# Patient Record
Sex: Female | Born: 1940 | Race: White | Hispanic: No | Smoking: Current some day smoker
Health system: Southern US, Community
[De-identification: ages and names within clinical notes are randomized; demographics above are authoritative.]

## PROBLEM LIST (undated history)

## (undated) DIAGNOSIS — E871 Hypo-osmolality and hyponatremia: Secondary | ICD-10-CM

## (undated) DIAGNOSIS — IMO0002 Reserved for concepts with insufficient information to code with codable children: Secondary | ICD-10-CM

## (undated) DIAGNOSIS — J449 Chronic obstructive pulmonary disease, unspecified: Secondary | ICD-10-CM

## (undated) DIAGNOSIS — I10 Essential (primary) hypertension: Secondary | ICD-10-CM

## (undated) DIAGNOSIS — R296 Repeated falls: Secondary | ICD-10-CM

## (undated) DIAGNOSIS — B9620 Unspecified Escherichia coli [E. coli] as the cause of diseases classified elsewhere: Secondary | ICD-10-CM

## (undated) DIAGNOSIS — M25552 Pain in left hip: Secondary | ICD-10-CM

## (undated) DIAGNOSIS — R4189 Other symptoms and signs involving cognitive functions and awareness: Secondary | ICD-10-CM

## (undated) DIAGNOSIS — N390 Urinary tract infection, site not specified: Secondary | ICD-10-CM

## (undated) DIAGNOSIS — E440 Moderate protein-calorie malnutrition: Secondary | ICD-10-CM

## (undated) DIAGNOSIS — M549 Dorsalgia, unspecified: Secondary | ICD-10-CM

## (undated) DIAGNOSIS — R531 Weakness: Secondary | ICD-10-CM

## (undated) DIAGNOSIS — F419 Anxiety disorder, unspecified: Secondary | ICD-10-CM

## (undated) DIAGNOSIS — Z720 Tobacco use: Secondary | ICD-10-CM

## (undated) DIAGNOSIS — F1010 Alcohol abuse, uncomplicated: Secondary | ICD-10-CM

## (undated) DIAGNOSIS — E785 Hyperlipidemia, unspecified: Secondary | ICD-10-CM

## (undated) HISTORY — DX: Urinary tract infection, site not specified: N39.0

## (undated) HISTORY — DX: Moderate protein-calorie malnutrition: E44.0

## (undated) HISTORY — DX: Tobacco use: Z72.0

## (undated) HISTORY — DX: Hypercalcemia: E83.52

## (undated) HISTORY — DX: Weakness: R53.1

## (undated) HISTORY — PX: DENTAL SURGERY: SHX609

## (undated) HISTORY — DX: Other symptoms and signs involving cognitive functions and awareness: R41.89

## (undated) HISTORY — DX: Repeated falls: R29.6

## (undated) HISTORY — DX: Urinary tract infection, site not specified: B96.20

## (undated) HISTORY — DX: Pain in left hip: M25.552

## (undated) HISTORY — PX: BACK SURGERY: SHX140

## (undated) HISTORY — DX: Alcohol abuse, uncomplicated: F10.10

## (undated) HISTORY — DX: Hypo-osmolality and hyponatremia: E87.1

---

## 1999-09-06 ENCOUNTER — Encounter: Payer: Self-pay | Admitting: General Surgery

## 1999-09-06 ENCOUNTER — Encounter: Admission: RE | Admit: 1999-09-06 | Discharge: 1999-09-06 | Payer: Self-pay

## 1999-09-06 ENCOUNTER — Encounter (INDEPENDENT_AMBULATORY_CARE_PROVIDER_SITE_OTHER): Payer: Self-pay | Admitting: *Deleted

## 1999-09-06 ENCOUNTER — Other Ambulatory Visit: Admission: RE | Admit: 1999-09-06 | Discharge: 1999-09-06 | Payer: Self-pay | Admitting: General Surgery

## 2000-03-30 ENCOUNTER — Encounter: Admission: RE | Admit: 2000-03-30 | Discharge: 2000-03-30 | Payer: Self-pay | Admitting: Obstetrics and Gynecology

## 2000-03-30 ENCOUNTER — Encounter: Payer: Self-pay | Admitting: Obstetrics and Gynecology

## 2001-03-22 ENCOUNTER — Encounter: Payer: Self-pay | Admitting: General Surgery

## 2001-03-22 ENCOUNTER — Encounter (INDEPENDENT_AMBULATORY_CARE_PROVIDER_SITE_OTHER): Payer: Self-pay | Admitting: *Deleted

## 2001-03-22 ENCOUNTER — Ambulatory Visit (HOSPITAL_BASED_OUTPATIENT_CLINIC_OR_DEPARTMENT_OTHER): Admission: RE | Admit: 2001-03-22 | Discharge: 2001-03-22 | Payer: Self-pay | Admitting: General Surgery

## 2001-03-22 ENCOUNTER — Encounter: Admission: RE | Admit: 2001-03-22 | Discharge: 2001-03-22 | Payer: Self-pay | Admitting: General Surgery

## 2003-05-17 ENCOUNTER — Other Ambulatory Visit: Admission: RE | Admit: 2003-05-17 | Discharge: 2003-05-17 | Payer: Self-pay | Admitting: Family Medicine

## 2003-09-12 ENCOUNTER — Encounter: Admission: RE | Admit: 2003-09-12 | Discharge: 2003-11-02 | Payer: Self-pay | Admitting: Family Medicine

## 2005-01-27 ENCOUNTER — Encounter: Admission: RE | Admit: 2005-01-27 | Discharge: 2005-01-27 | Payer: Self-pay | Admitting: Internal Medicine

## 2005-01-29 ENCOUNTER — Encounter: Admission: RE | Admit: 2005-01-29 | Discharge: 2005-02-18 | Payer: Self-pay | Admitting: Internal Medicine

## 2005-05-16 ENCOUNTER — Ambulatory Visit: Payer: Self-pay | Admitting: Physical Medicine & Rehabilitation

## 2005-05-16 ENCOUNTER — Encounter
Admission: RE | Admit: 2005-05-16 | Discharge: 2005-08-14 | Payer: Self-pay | Admitting: Physical Medicine & Rehabilitation

## 2006-11-17 ENCOUNTER — Other Ambulatory Visit: Admission: RE | Admit: 2006-11-17 | Discharge: 2006-11-17 | Payer: Self-pay | Admitting: Family Medicine

## 2010-02-04 ENCOUNTER — Emergency Department (HOSPITAL_COMMUNITY): Admission: EM | Admit: 2010-02-04 | Discharge: 2010-02-04 | Payer: Self-pay | Admitting: Emergency Medicine

## 2010-02-09 ENCOUNTER — Ambulatory Visit (HOSPITAL_BASED_OUTPATIENT_CLINIC_OR_DEPARTMENT_OTHER): Admission: RE | Admit: 2010-02-09 | Discharge: 2010-02-09 | Payer: Self-pay | Admitting: Family Medicine

## 2010-02-09 ENCOUNTER — Ambulatory Visit: Payer: Self-pay | Admitting: Diagnostic Radiology

## 2010-04-03 ENCOUNTER — Encounter (HOSPITAL_BASED_OUTPATIENT_CLINIC_OR_DEPARTMENT_OTHER)
Admission: RE | Admit: 2010-04-03 | Discharge: 2010-04-23 | Payer: Self-pay | Source: Home / Self Care | Attending: General Surgery | Admitting: General Surgery

## 2010-04-24 ENCOUNTER — Encounter (HOSPITAL_BASED_OUTPATIENT_CLINIC_OR_DEPARTMENT_OTHER): Payer: Medicare Other | Attending: General Surgery

## 2010-04-24 DIAGNOSIS — I10 Essential (primary) hypertension: Secondary | ICD-10-CM | POA: Insufficient documentation

## 2010-04-24 DIAGNOSIS — J4489 Other specified chronic obstructive pulmonary disease: Secondary | ICD-10-CM | POA: Insufficient documentation

## 2010-04-24 DIAGNOSIS — L97309 Non-pressure chronic ulcer of unspecified ankle with unspecified severity: Secondary | ICD-10-CM | POA: Insufficient documentation

## 2010-04-24 DIAGNOSIS — J449 Chronic obstructive pulmonary disease, unspecified: Secondary | ICD-10-CM | POA: Insufficient documentation

## 2010-04-24 DIAGNOSIS — Z79899 Other long term (current) drug therapy: Secondary | ICD-10-CM | POA: Insufficient documentation

## 2010-05-29 ENCOUNTER — Encounter (HOSPITAL_BASED_OUTPATIENT_CLINIC_OR_DEPARTMENT_OTHER): Payer: Medicare Other | Attending: General Surgery

## 2010-05-29 DIAGNOSIS — L97309 Non-pressure chronic ulcer of unspecified ankle with unspecified severity: Secondary | ICD-10-CM | POA: Insufficient documentation

## 2010-05-29 DIAGNOSIS — I10 Essential (primary) hypertension: Secondary | ICD-10-CM | POA: Insufficient documentation

## 2010-05-29 DIAGNOSIS — J449 Chronic obstructive pulmonary disease, unspecified: Secondary | ICD-10-CM | POA: Insufficient documentation

## 2010-05-29 DIAGNOSIS — Z79899 Other long term (current) drug therapy: Secondary | ICD-10-CM | POA: Insufficient documentation

## 2010-05-29 DIAGNOSIS — J4489 Other specified chronic obstructive pulmonary disease: Secondary | ICD-10-CM | POA: Insufficient documentation

## 2010-06-04 LAB — CBC
HCT: 47.8 % — ABNORMAL HIGH (ref 36.0–46.0)
Hemoglobin: 16.5 g/dL — ABNORMAL HIGH (ref 12.0–15.0)
MCH: 32.9 pg (ref 26.0–34.0)
MCHC: 34.6 g/dL (ref 30.0–36.0)
RBC: 5.02 MIL/uL (ref 3.87–5.11)

## 2010-06-04 LAB — BASIC METABOLIC PANEL
BUN: 5 mg/dL — ABNORMAL LOW (ref 6–23)
CO2: 26 mEq/L (ref 19–32)
Calcium: 10 mg/dL (ref 8.4–10.5)
Chloride: 95 mEq/L — ABNORMAL LOW (ref 96–112)
Creatinine, Ser: 0.62 mg/dL (ref 0.4–1.2)
GFR calc Af Amer: >60 mL/min (ref >60)
GFR calc non Af Amer: >60 mL/min (ref >60)
Glucose, Bld: 88 mg/dL (ref 70–99)
Potassium: 4.4 mEq/L (ref 3.5–5.1)
Sodium: 131 mEq/L — ABNORMAL LOW (ref 135–145)

## 2010-06-04 LAB — DIFFERENTIAL
Basophils Relative: 1 % (ref 0–1)
Lymphs Abs: 1.7 10*3/uL (ref 0.7–4.0)
Monocytes Absolute: 0.6 10*3/uL (ref 0.1–1.0)
Monocytes Relative: 9 % (ref 3–12)
Neutro Abs: 4.1 10*3/uL (ref 1.7–7.7)
Neutrophils Relative %: 63 % (ref 43–77)

## 2010-06-04 LAB — HEPATIC FUNCTION PANEL
AST: 26 U/L (ref 0–37)
Albumin: 4.5 g/dL (ref 3.5–5.2)
Alkaline Phosphatase: 54 U/L (ref 39–117)
Bilirubin, Direct: 0.2 mg/dL (ref 0.0–0.3)
Total Bilirubin: 0.9 mg/dL (ref 0.3–1.2)

## 2010-06-25 ENCOUNTER — Encounter (HOSPITAL_BASED_OUTPATIENT_CLINIC_OR_DEPARTMENT_OTHER): Payer: Medicare Other | Attending: General Surgery

## 2010-06-25 DIAGNOSIS — J449 Chronic obstructive pulmonary disease, unspecified: Secondary | ICD-10-CM | POA: Insufficient documentation

## 2010-06-25 DIAGNOSIS — J4489 Other specified chronic obstructive pulmonary disease: Secondary | ICD-10-CM | POA: Insufficient documentation

## 2010-06-25 DIAGNOSIS — Z79899 Other long term (current) drug therapy: Secondary | ICD-10-CM | POA: Insufficient documentation

## 2010-06-25 DIAGNOSIS — L97309 Non-pressure chronic ulcer of unspecified ankle with unspecified severity: Secondary | ICD-10-CM | POA: Insufficient documentation

## 2010-06-25 DIAGNOSIS — I10 Essential (primary) hypertension: Secondary | ICD-10-CM | POA: Insufficient documentation

## 2010-07-03 ENCOUNTER — Other Ambulatory Visit: Payer: Self-pay | Admitting: Orthopedic Surgery

## 2010-07-03 ENCOUNTER — Other Ambulatory Visit (HOSPITAL_COMMUNITY): Payer: Self-pay | Admitting: Orthopedic Surgery

## 2010-07-03 DIAGNOSIS — S22059A Unspecified fracture of T5-T6 vertebra, initial encounter for closed fracture: Secondary | ICD-10-CM

## 2010-07-03 DIAGNOSIS — R05 Cough: Secondary | ICD-10-CM

## 2010-07-05 ENCOUNTER — Ambulatory Visit (HOSPITAL_COMMUNITY): Payer: Medicare Other

## 2010-07-05 ENCOUNTER — Encounter (HOSPITAL_COMMUNITY): Payer: Medicare Other

## 2010-07-15 ENCOUNTER — Ambulatory Visit
Admission: RE | Admit: 2010-07-15 | Discharge: 2010-07-15 | Disposition: A | Discharge status: Home / Self Care | Payer: Medicare Other | Source: Ambulatory Visit | Attending: Orthopedic Surgery | Admitting: Orthopedic Surgery

## 2010-07-15 DIAGNOSIS — R05 Cough: Secondary | ICD-10-CM

## 2010-08-09 ENCOUNTER — Encounter (HOSPITAL_COMMUNITY)
Admission: RE | Admit: 2010-08-09 | Discharge: 2010-08-09 | Disposition: A | Discharge status: Home / Self Care | Payer: Medicare Other | Source: Ambulatory Visit | Attending: Neurological Surgery | Admitting: Neurological Surgery

## 2010-08-09 ENCOUNTER — Other Ambulatory Visit (HOSPITAL_COMMUNITY): Payer: Self-pay | Admitting: Neurological Surgery

## 2010-08-09 ENCOUNTER — Ambulatory Visit (HOSPITAL_COMMUNITY)
Admission: RE | Admit: 2010-08-09 | Discharge: 2010-08-09 | Disposition: A | Discharge status: Home / Self Care | Payer: Medicare Other | Source: Ambulatory Visit | Attending: Neurological Surgery | Admitting: Neurological Surgery

## 2010-08-09 DIAGNOSIS — Z01812 Encounter for preprocedural laboratory examination: Secondary | ICD-10-CM | POA: Insufficient documentation

## 2010-08-09 DIAGNOSIS — M8448XA Pathological fracture, other site, initial encounter for fracture: Secondary | ICD-10-CM | POA: Insufficient documentation

## 2010-08-09 DIAGNOSIS — Z01811 Encounter for preprocedural respiratory examination: Secondary | ICD-10-CM | POA: Insufficient documentation

## 2010-08-09 DIAGNOSIS — M40204 Unspecified kyphosis, thoracic region: Secondary | ICD-10-CM

## 2010-08-09 LAB — BASIC METABOLIC PANEL
BUN: 11 mg/dL (ref 6–23)
Calcium: 10.6 mg/dL — ABNORMAL HIGH (ref 8.4–10.5)
Chloride: 90 mEq/L — ABNORMAL LOW (ref 96–112)
Creatinine, Ser: 0.63 mg/dL (ref 0.4–1.2)
GFR calc Af Amer: >60 mL/min (ref >60)
GFR calc non Af Amer: >60 mL/min (ref >60)

## 2010-08-09 LAB — CBC
MCH: 34 pg (ref 26.0–34.0)
MCHC: 35.9 g/dL (ref 30.0–36.0)
MCV: 94.8 fL (ref 78.0–100.0)
Platelets: 427 10*3/uL — ABNORMAL HIGH (ref 150–400)
RDW: 12.9 % (ref 11.5–15.5)

## 2010-08-09 LAB — SURGICAL PCR SCREEN
MRSA, PCR: POSITIVE — AB
Staphylococcus aureus: POSITIVE — AB

## 2010-08-09 NOTE — Op Note (Signed)
[place]  Patient:    [name], [name] Visit Number: [id number] MRN: [id number]          Service Type: DSU Location: [facility] Attending Physician:  Delsa Bern Dictated by:   Lorne Skeens. Hoxworth, M.D. Proc. Date: 03/22/01 Admit Date:  03/22/2001                             Operative Report  PREOPERATIVE DIAGNOSIS:  Abnormal mammogram, left breast.  POSTOPERATIVE DIAGNOSIS:  Abnormal mammogram, left breast.  PROCEDURE:  Needle-localized left breast biopsy.  SURGEON:  Lorne Skeens. Hoxworth, M.D.  ANESTHESIA:  Laryngeal mask general.  BRIEF HISTORY:  Ms. Gruhn is a 70 year old white female who has previously had a large-core needle biopsy of a nodular density in the upper outer quadrant of the left breast.  This did not reveal any suspicious findings. This has been followed mammographically and on recent mammogram has been noted to enlarge.  There is still no palpable abnormality.  Due to progression on the mammogram, excision has been recommended.  Needle localization will be required.  The nature of the procedure, its indications and risks of bleeding and infection were discussed and understood preoperatively.  Following successful needle localization, she is now brought to the operating room.  DESCRIPTION OF PROCEDURE:  The patient was brought to the operating room and placed in the supine position on the operating table.  IV sedation was administered but the patient was restless despite deep sedation, and therefore this was converted to laryngeal mask general without difficulty.  The left breast was sterilely prepped and draped.  A curvilinear incision was made near the wire insertion site and dissection was carried down to the subcutaneous tissue and the wire brought into the incision.  Following this, a generous core of tissue was excised with cautery around the tip of the wire.  This was sent for specimen mammography, which  confirmed removal of the lesion. Hemostasis was obtained with cautery, and the soft tissue was infiltrated with local anesthetic.  The wound was irrigated and complete hemostasis assured. The subcutaneous tissue was reapproximated with interrupted 4-0 Monocryl and the skin with running subcuticular 4-0 Monocryl and Steri-Strips.  Sponge, needle, and instrument counts were correct.  A dry sterile dressing was applied and the patient taken to recovery in good condition. Dictated by:   Lorne Skeens. Hoxworth, M.D. Attending Physician:  Delsa Bern DD:  03/22/01 TD:  03/22/01 Job: [id number]

## 2010-08-09 NOTE — Procedures (Signed)
[name], AUST                ACCOUNT NO.:  [id number]   MEDICAL RECORD NO.:  [id number]          PATIENT TYPE:  REC   LOCATION:  [facility]                          FACILITY:  [facility]   PHYSICIAN:  Erick Colace, M.D.DATE OF BIRTH:  Jan 21, 1941   DATE OF PROCEDURE:  05/26/2005  DATE OF DISCHARGE:                                 OPERATIVE REPORT   MEDICAL RECORD NUMBER:  [id number].   Acupuncture treatment #3.   DIAGNOSIS:  Thoracolumbar spondylosis, thoracolumbar myofascial pain  syndrome.   Treatment today consisted of points placed bilaterally at BL21 and BL27 and  on the right side additionally at BL23, BL25 as well as GB30, BL53, BL54.   Electrical stimulation at 4 hertz x30 minutes. The patient tolerated the  procedure well. Repeat treatment in two to three days.      Erick Colace, M.D.  Electronically Signed     AEK/MEDQ  D:  05/26/2005 12:57:18  T:  05/27/2005 10:41:45  Job:  [id number]

## 2010-08-09 NOTE — Procedures (Signed)
NAMEJERALDEAN, [name]                ACCOUNT NO.:  [id number]   MEDICAL RECORD NO.:  [id number]          PATIENT TYPE:  REC   LOCATION:  [facility]                          FACILITY:  [facility]   PHYSICIAN:  Erick Colace, M.D.DATE OF BIRTH:  [birth year]   DATE OF PROCEDURE:  05/20/2005  DATE OF DISCHARGE:                                 OPERATIVE REPORT   MEDICAL RECORD NUMBER:  [id number].   A 70 year old female who woke up with right hip pain in September 2006.  Denies any obvious trauma history. Pain has improved with activity as well  as hot shower. She had some improvement after a thoracic epidural, but  repeat one really did not help. Pain averages 4/10. Her sleep is good. She  functionally able to climb steps and drive. She is retired.   PHYSICAL EXAMINATION:  Blood pressure is 173/100. She states she always gets  nervous in the doctor's office. Pulse 81, respiratory rate 16, O2 99% on  room air.   She is a thin, elderly female in no acute distress.  HEENT:  Eyes anicteric, noninjected. Affect was bright, alert, mildly  anxious.   Gait is normal. Able to toe walk, heel walk. She has no pain with internal  or external rotation of the hip. No pain over the greater trochanters or  over the thoracic spine area. She has full range of motion in forward  flexion and extension. She has full range of motion bilateral lower  extremities.   IMPRESSION:  Thoracolumbar spondylosis as well as myofascial pain syndrome.   PLAN:  Will do trial of acupuncture starting today. Will schedule her for  six visits.   ADDENDUM:  Acupuncture needles placed bilaterally at KI3, SP6, BL60 as well  as bilaterally at ST36. Electrical stimulation between KI3 and SP6. The  patient states about 20 minutes into the procedure she had discomfort and  took her needles out. She does, however, wish to return to give adequate  trial of treatment. We will do more localized treatment next visit.      Erick Colace, M.D.  Electronically Signed     AEK/MEDQ  D:  05/20/2005 13:55:20  T:  05/21/2005 09:36:25  Job:  [id number]   cc:   Caralyn Guile. Ethelene Hal, M.D.  Fax: [phone]

## 2010-08-09 NOTE — Procedures (Signed)
[name], [name]                ACCOUNT NO.:  [id number]   MEDICAL RECORD NO.:  [id number]          PATIENT TYPE:  REC   LOCATION:  [facility]                          FACILITY:  [facility]   PHYSICIAN:  Erick Colace, M.D.DATE OF BIRTH:  26-Aug-1940   DATE OF PROCEDURE:  06/02/2005  DATE OF DISCHARGE:                                 OPERATIVE REPORT   PROCEDURE:  Acupuncture treatment #5.   Pain score is 4/10 currently.   Treatment today consisted of bilateral BL21, BL27, as well as right GB30,  right BL29, BL30, BL28 and BL53.  Electrical stimulation, 4 Hz x30 minutes.  The patient tolerated the procedure well.      Erick Colace, M.D.  Electronically Signed     AEK/MEDQ  D:  06/02/2005 13:45:48  T:  06/03/2005 13:28:34  Job:  [id number]

## 2010-08-09 NOTE — Procedures (Signed)
[name], [name]                ACCOUNT NO.:  [id number]   MEDICAL RECORD NO.:  [id number]          PATIENT TYPE:  REC   LOCATION:  [facility]                          FACILITY:  [facility]   PHYSICIAN:  Erick Colace, M.D.DATE OF BIRTH:  October 01, 1940   DATE OF PROCEDURE:  05/29/2005  DATE OF DISCHARGE:                                 OPERATIVE REPORT   Acupuncture treatment #4.   DIAGNOSES:  1.  Thoracolumbar spondylosis.  2.  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   Treatment today consisted of points placed bilaterally BL21 and BL27 and on  the right side at BL23 and BL25 as well as GB26, GB30 and two additional  points in the gluteus medius.  Electrical stimulation 4 Hz x30 minutes x3  sets of needles and then alternating 4 and 20 Hz the remaining two needle  pairs.  The patient tolerated the procedure well.   TREATMENT TIME:  30 minutes.   This treatment was done in the right lateral decubitus position for  increased patient comfort.      Erick Colace, M.D.  Electronically Signed     [name]/MEDQ  D:  05/29/2005 14:13:38  T:  05/30/2005 11:23:17  Job:  [id number]   cc:   Caralyn Guile. Ethelene Hal, M.D.  Fax: [phone]

## 2010-08-09 NOTE — Procedures (Signed)
[name], [name]                ACCOUNT NO.:  [id number]   MEDICAL RECORD NO.:  [id number]          PATIENT TYPE:  REC   LOCATION:  [facility]                          FACILITY:  [facility]   PHYSICIAN:  Erick Colace, M.D.DATE OF BIRTH:  July 11, 1940   DATE OF PROCEDURE:  06/05/2005  DATE OF DISCHARGE:                                 OPERATIVE REPORT   HISTORY:  A 70 year old female with a history of thoracolumbar spondylosis  and myofascial pain syndrome as well as possible right sacroiliac  arthropathy, here for her sixth acupuncture treatment.  Her average pain is  4-5/10, which is stable compared to prior and equal to her pretreatment  course.   Last treatment was done June 02, 2005.  Treatment today consists of  bilateral BL21, BL27, right GB30, right BL25, right BL23, right BL29, right  BL30, and right BL53.  Electrical stimulation at 20 Hz x30 minutes.  The  patient tolerated the procedure well.   If she has no significant benefit, i.e., drop of pain score by one or two  points out of 10 by next visit, consider other treatment options.  Consider  sacroiliac joint injections.  The patient was will follow up with Dr. Ethelene Hal  if no significant relief after acupuncture treatment.      Erick Colace, M.D.  Electronically Signed     AEK/MEDQ  D:  06/05/2005 16:07:35  T:  06/07/2005 10:17:44  Job:  [id number]   cc:   Caralyn Guile. Ethelene Hal, M.D.  Fax: [phone]

## 2010-08-09 NOTE — Procedures (Signed)
[name], [name]                ACCOUNT NO.:  [id number]   MEDICAL RECORD NO.:  [id number]          PATIENT TYPE:  REC   LOCATION:  [facility]                          FACILITY:  [facility]   PHYSICIAN:  Erick Colace, M.D.DATE OF BIRTH:  29-Jul-1940   DATE OF PROCEDURE:  05/23/2005  DATE OF DISCHARGE:                                 OPERATIVE REPORT   MEDICAL RECORD NUMBER:  [id number].   Treatment today consisted of acupuncture treatment x2. Treatment consisted  of needles placed bilaterally at BL23, BL52 and DU4. Electrical stimulation  between BL 23 and BL52 on the left and on the right at 4 hertz x25 minutes.  The patient tolerated the procedure well. Post-injection instructions given.  Return in one week.      Erick Colace, M.D.  Electronically Signed     AEK/MEDQ  D:  05/23/2005 14:34:10  T:  05/24/2005 07:12:55  Job:  [id number]

## 2010-08-12 ENCOUNTER — Inpatient Hospital Stay (HOSPITAL_COMMUNITY): Payer: Medicare Other

## 2010-08-12 ENCOUNTER — Inpatient Hospital Stay (HOSPITAL_COMMUNITY)
Admission: RE | Admit: 2010-08-12 | Discharge: 2010-08-12 | DRG: 517 | Disposition: A | Discharge status: Home / Self Care | Payer: Medicare Other | Source: Ambulatory Visit | Attending: Neurological Surgery | Admitting: Neurological Surgery

## 2010-08-12 DIAGNOSIS — M810 Age-related osteoporosis without current pathological fracture: Secondary | ICD-10-CM | POA: Diagnosis present

## 2010-08-12 DIAGNOSIS — I10 Essential (primary) hypertension: Secondary | ICD-10-CM | POA: Diagnosis present

## 2010-08-12 DIAGNOSIS — M8448XA Pathological fracture, other site, initial encounter for fracture: Principal | ICD-10-CM | POA: Diagnosis present

## 2010-08-12 DIAGNOSIS — Z01810 Encounter for preprocedural cardiovascular examination: Secondary | ICD-10-CM

## 2010-08-12 DIAGNOSIS — J4489 Other specified chronic obstructive pulmonary disease: Secondary | ICD-10-CM | POA: Diagnosis present

## 2010-08-12 DIAGNOSIS — F17200 Nicotine dependence, unspecified, uncomplicated: Secondary | ICD-10-CM | POA: Diagnosis present

## 2010-08-12 DIAGNOSIS — J449 Chronic obstructive pulmonary disease, unspecified: Secondary | ICD-10-CM | POA: Diagnosis present

## 2010-08-12 DIAGNOSIS — Z01812 Encounter for preprocedural laboratory examination: Secondary | ICD-10-CM

## 2010-08-12 DIAGNOSIS — Z79899 Other long term (current) drug therapy: Secondary | ICD-10-CM

## 2010-08-26 ENCOUNTER — Emergency Department (HOSPITAL_COMMUNITY): Payer: Medicare Other

## 2010-08-26 ENCOUNTER — Inpatient Hospital Stay (HOSPITAL_COMMUNITY)
Admission: EM | Admit: 2010-08-26 | Discharge: 2010-08-30 | DRG: 309 | Disposition: A | Discharge status: Skilled Nursing Facility | Payer: Medicare Other | Attending: Family Medicine | Admitting: Family Medicine

## 2010-08-26 DIAGNOSIS — J449 Chronic obstructive pulmonary disease, unspecified: Secondary | ICD-10-CM | POA: Diagnosis present

## 2010-08-26 DIAGNOSIS — Z7982 Long term (current) use of aspirin: Secondary | ICD-10-CM

## 2010-08-26 DIAGNOSIS — F17200 Nicotine dependence, unspecified, uncomplicated: Secondary | ICD-10-CM | POA: Diagnosis present

## 2010-08-26 DIAGNOSIS — R002 Palpitations: Principal | ICD-10-CM | POA: Diagnosis present

## 2010-08-26 DIAGNOSIS — F411 Generalized anxiety disorder: Secondary | ICD-10-CM | POA: Diagnosis present

## 2010-08-26 DIAGNOSIS — R631 Polydipsia: Secondary | ICD-10-CM | POA: Diagnosis present

## 2010-08-26 DIAGNOSIS — Z9889 Other specified postprocedural states: Secondary | ICD-10-CM

## 2010-08-26 DIAGNOSIS — J4489 Other specified chronic obstructive pulmonary disease: Secondary | ICD-10-CM | POA: Diagnosis present

## 2010-08-26 DIAGNOSIS — E876 Hypokalemia: Secondary | ICD-10-CM | POA: Diagnosis not present

## 2010-08-26 DIAGNOSIS — Z79899 Other long term (current) drug therapy: Secondary | ICD-10-CM

## 2010-08-26 DIAGNOSIS — E871 Hypo-osmolality and hyponatremia: Secondary | ICD-10-CM | POA: Diagnosis present

## 2010-08-26 DIAGNOSIS — I10 Essential (primary) hypertension: Secondary | ICD-10-CM | POA: Diagnosis present

## 2010-08-26 LAB — DIFFERENTIAL
Basophils Relative: 0 % (ref 0–1)
Eosinophils Relative: 0 % (ref 0–5)
Monocytes Absolute: 0.8 10*3/uL (ref 0.1–1.0)
Monocytes Relative: 8 % (ref 3–12)
Neutro Abs: 7.7 10*3/uL (ref 1.7–7.7)

## 2010-08-26 LAB — CK TOTAL AND CKMB (NOT AT ARMC): Total CK: 666 U/L — ABNORMAL HIGH (ref 7–177)

## 2010-08-26 LAB — CBC
Hemoglobin: 16.3 g/dL — ABNORMAL HIGH (ref 12.0–15.0)
MCH: 33.8 pg (ref 26.0–34.0)
MCHC: 36.5 g/dL — ABNORMAL HIGH (ref 30.0–36.0)
RDW: 12.8 % (ref 11.5–15.5)

## 2010-08-26 LAB — BASIC METABOLIC PANEL
BUN: 8 mg/dL (ref 6–23)
CO2: 27 mEq/L (ref 19–32)
Calcium: 9.5 mg/dL (ref 8.4–10.5)
Chloride: 88 mEq/L — ABNORMAL LOW (ref 96–112)
Creatinine, Ser: 0.5 mg/dL (ref 0.4–1.2)

## 2010-08-26 LAB — D-DIMER, QUANTITATIVE: D-Dimer, Quant: 1.21 ug/mL-FEU — ABNORMAL HIGH (ref 0.00–0.48)

## 2010-08-26 LAB — MRSA PCR SCREENING: MRSA by PCR: NEGATIVE

## 2010-08-26 MED ORDER — IOHEXOL 300 MG/ML  SOLN
100.0000 mL | Freq: Once | INTRAMUSCULAR | Status: AC | PRN
  Administered 2010-08-26: 100 mL via INTRAVENOUS

## 2010-08-27 LAB — CARDIAC PANEL(CRET KIN+CKTOT+MB+TROPI)
CK, MB: 7.5 ng/mL (ref 0.3–4.0)
Relative Index: 1.4 (ref 0.0–2.5)
Relative Index: 1.5 (ref 0.0–2.5)
Total CK: 538 U/L — ABNORMAL HIGH (ref 7–177)
Troponin I: <0.3 ng/mL (ref <0.30)
Troponin I: <0.3 ng/mL (ref <0.30)

## 2010-08-27 LAB — COMPREHENSIVE METABOLIC PANEL
Albumin: 2.9 g/dL — ABNORMAL LOW (ref 3.5–5.2)
BUN: 9 mg/dL (ref 6–23)
Calcium: 8.1 mg/dL — ABNORMAL LOW (ref 8.4–10.5)
Chloride: 92 mEq/L — ABNORMAL LOW (ref 96–112)
Creatinine, Ser: <0.47 mg/dL (ref 0.4–1.2)
Total Bilirubin: 0.7 mg/dL (ref 0.3–1.2)

## 2010-08-27 LAB — CBC
MCH: 32.1 pg (ref 26.0–34.0)
Platelets: 400 10*3/uL (ref 150–400)
RBC: 4.49 MIL/uL (ref 3.87–5.11)
WBC: 9.1 10*3/uL (ref 4.0–10.5)

## 2010-08-27 LAB — TSH: TSH: 1.012 u[IU]/mL (ref 0.350–4.500)

## 2010-08-27 LAB — DIFFERENTIAL
Lymphocytes Relative: 22 % (ref 12–46)
Lymphs Abs: 2 10*3/uL (ref 0.7–4.0)
Neutrophils Relative %: 67 % (ref 43–77)

## 2010-08-27 NOTE — H&P (Signed)
NAMETONA, QUALLEY NO.:  [id number]  MEDICAL RECORD NO.:  [id number]  LOCATION:  [facility]                         FACILITY:  [facility]  PHYSICIAN:  Talmage Nap, MD  DATE OF BIRTH:  Nov 05, 1940  DATE OF ADMISSION:  08/26/2010 DATE OF DISCHARGE:                             HISTORY & PHYSICAL   PRIMARY CARE PHYSICIAN:  Pam Drown, MD  NEUROSURGEON:  Stefani Dama, MD  History obtainable from the patient.  CHIEF COMPLAINT:  "My heart is beating fast" of about 2-3 days duration.  The patient is a 70 year old Caucasian female with history of subacute compression fracture of T6 and subsequently had balloon kyphoplasty done on Aug 12, 2010, presenting to the emergency room with feeling of her heart beating very fast of about 2 days duration and this was said to be on and off and got progressively worse today.  She, however, denied any history of chest pain.  She denied any history of shortness of breath. She denied any nausea or vomiting.  No diaphoresis, no fever, no chills, no rigor but the patient claims she was getting uncomfortable with the on and off fast heart rate and hence she presented to the emergency room to be evaluated.  PAST MEDICAL HISTORY:  Positive for hypertension.  PAST SURGICAL HISTORY:  Subacute compression fracture of T6, status post kyphoplasty.  PREADMISSION MEDS: 1. Prempro 0.3/1.5, 1 p.o. every other day. 2. Hydrocodone/APAP 5/500, 1 p.o. q.6 h. 3. Vitamin E over-the-counter 1 cap p.o. daily. 4. Vitamin D3 over-the-counter 1 p.o. daily. 5. Tramadol 50 mg 1 p.o. q.6 p.r.n. 6. Spiriva (tiotropium) 18 mcg inhaled daily p.r.n. 7. Omega-3 over-the-counter 1 p.o. daily. 8. Lisinopril 40 mg 1 p.o. daily.  ALLERGIES:  She has no known allergies.  SOCIAL HISTORY:  The patient smokes about a pack of cigarettes q. weekly for the past 40 years, takes wine on a regular basis, could not quantify how much she drinks and she is  currently retired.  FAMILY HISTORY:  Negative for any coronary artery disease or hypercoagulable state.  REVIEW OF SYSTEMS:  The patient presently denies any headaches.  No blurred vision.  No nausea or vomiting.  No fever.  No chills.  No rigor.  No chest pain or shortness of breath.  Palpitation has resolved. Denies any PND or orthopnea.  No cough.  No abdominal discomfort.  No diarrhea or hematochezia.  No dysuria or hematuria.  Complained about pain at the mid back.  No intolerance to heat or cold and no neuropsychiatric disorder.  PHYSICAL EXAMINATION:  GENERAL:  A very pleasant elderly lady not in any respiratory distress with suboptimal hydration. PRESENT VITAL SIGNS:  Blood pressure is 122/79, pulse is 101, respiratory rate is 24, temperature is 98.2. HEENT:  Pupils are reactive to light and extraocular muscles are intact. NECK:  No jugular venous distention.  No carotid bruit.  No lymphadenopathy. CHEST:  Clear to auscultation. CARDIAC:  Heart sounds are 1 and 2.  No tachycardia.  No murmur. ABDOMEN:  Soft, nontender.  Liver, spleen, and kidney not palpable. Bowel sounds are positive. EXTREMITIES:  No pedal edema. NEUROLOGIC:  Nonfocal. MUSCULOSKELETAL SYSTEM:  Arthritic changes  in the knees and feet. NEUROPSYCHIATRIC:  Evaluation unremarkable. SKIN:  Shows slightly decreased turgor.  LABORATORY DATA:  Initial complete blood count with differential showed WBC of 9.9, hemoglobin of 16.3, hematocrit of 44.7, MCV of 92.7 with a platelet count of 397,000.  Differential, neutrophil is 78%, D-dimer 1.21.  Basic metabolic panel showed sodium of 127, potassium of 3.8, chloride of 80 with a bicarb of 27, glucose is 79, BUN is 8, creatinine 0.50.  EKG showed a narrow complex tachycardia with a rate of 113.  No acute ST-wave change noted.  Imaging studies done on the patient include chest x-ray which showed no acute cardiopulmonary process.  CT angiogram was negative for acute  pulmonary embolism.  There is, however, mild emphysematous changes.  Plan is to admit the patient to telemetry for observation to be ruled out for acute coronary syndrome.  IMPRESSION: 1. Palpitation to rule out acute coronary syndrome. 2. Hyponatremia. 3. Hypertension. 4. Chronic tobacco use. 5. Questionable chronic obstructive pulmonary disease.  PLAN:  Admit the patient to telemetry for observation.  The patient will be slowly rehydrated with normal saline IV to go at rate of 75 mL an hour.  Tachycardia will be controlled with Lopressor 50 mg p.o. b.i.d. For her chronic back pain, the patient will be on Dilaudid 1 mg IV q.4 p.r.n.  Blood pressure will be controlled with lisinopril 20 mg p.o. daily as well as with Lopressor.  GI prophylaxis with Protonix 40 mg p.o. daily and DVT prophylaxis with Lovenox 40 mg subcu q.24.  Labs to be ordered on this patient will include cardiac enzymes q.6 x3, 2-D echo, thyroid panel which include TSH, T3, and T4.  CBCD, CMP, and magnesium will be repeated in a.m. and if cardiac enzymes are normal, the patient will be discharged home in a.m. which is August 27, 2010.     Talmage Nap, MD     CN/MEDQ  D:  08/26/2010  T:  08/26/2010  Job:  [id number]  Electronically Signed by Talmage Nap  on 08/27/2010 06:55:18 AM

## 2010-08-28 LAB — URINALYSIS, ROUTINE W REFLEX MICROSCOPIC
Glucose, UA: NEGATIVE mg/dL
Hgb urine dipstick: NEGATIVE
Protein, ur: NEGATIVE mg/dL
Specific Gravity, Urine: 1.011 (ref 1.005–1.030)

## 2010-08-28 LAB — BASIC METABOLIC PANEL
CO2: 26 mEq/L (ref 19–32)
Chloride: 92 mEq/L — ABNORMAL LOW (ref 96–112)
Sodium: 126 mEq/L — ABNORMAL LOW (ref 135–145)

## 2010-08-29 LAB — URINE CULTURE
Colony Count: NO GROWTH
Culture: NO GROWTH

## 2010-08-29 LAB — BASIC METABOLIC PANEL
CO2: 27 mEq/L (ref 19–32)
Chloride: 93 mEq/L — ABNORMAL LOW (ref 96–112)
Sodium: 129 mEq/L — ABNORMAL LOW (ref 135–145)

## 2010-08-30 NOTE — Discharge Summary (Signed)
[name], DAUPHINEE NO.:  [id number]  MEDICAL RECORD NO.:  [id number]  LOCATION:  3029                         FACILITY:  [facility]  PHYSICIAN:  Standley Dakins, MD   DATE OF BIRTH:  1940/08/17  DATE OF ADMISSION:  08/26/2010 DATE OF DISCHARGE:  08/30/2010                        DISCHARGE SUMMARY - REFERRING   PRIMARY CARE PHYSICIAN:  Pam Drown, MD  NEUROSURGEON:  Stefani Dama, MD  DISCHARGE DIAGNOSES: 1. Postop status post kyphoplasty for subacute compression fracture     T6. 2. Hypertension. 3. Palpitations. 4. Anxiety disorder. 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 7. Chronic obstructive pulmonary disease. 8. Chronic active nicotine dependence.  DISCHARGE MEDICATIONS: 1. Aspirin 81 mg 1 p.o. daily. 2. Folic acid 1 mg p.o. daily. 3. Metoprolol 50 mg p.o. b.i.d. 4. Multivitamin 1 p.o. daily. 5. Thiamine 100 mg p.o. daily. 6. Hydrocodone/acetaminophen 5/500, 1 p.o. every 6 hours p.r.n. severe     pain. 7. Lisinopril 40 mg 1 p.o. daily. 8. Omega-3 capsules 1 p.o. daily. 9. Prempro 1 tablet p.o. every other day. 10.Spiriva 18 mcg 1 capsule inhaled daily. 11.Vitamin D3 1 tablet daily. 12.Vitamin E 1 capsule daily.  HOSPITAL COURSE:  Briefly this patient is a 70 year old female with a history of a subacute compression fracture of T6 and had balloon kyphoplasty done on Aug 12, 2010.  The patient presented to the emergency department complaining of palpitations.  The patient denied chest pain.  She was admitted into the hospital for further evaluation. Her cardiac enzymes were evaluated and proved negative for acute myocardial injury.  She did have an elevated creatinine kinase level. She was also found to be hyponatremic.  I talked with some of her family members, namely her sister [name] who told me that the patient chronically sips water all day long.  She has been having this chronic  hyponatremia as a result and working with her primary care provider regarding different treatments.  The patient did have some back pain during the hospitalization but experienced improvement in her condition over the course of the hospital stay.  She did work with physical therapy and she did have some meaningful improvement, however, rehab was recommended for further strength and power development.  The patient will be sent to a rehab facility for further treatment.  The patient's palpitations improved after being placed on Lopressor 50 mg twice daily.  Her blood pressures tolerated that.  Her pulse tolerated that as well.  She had no respiratory difficulties with this medication.  She was also treated for hypokalemia with oral potassium which she tolerated that well and her potassium prior to discharge is within normal limits.  DISCHARGE CONDITION:  Stable.  DISPOSITION:  The patient will be transferred to the acute rehab facility.  ACTIVITY:  Per acute rehab and physical therapy.  DIET:  Resume a cardiac diet that is recommended for her history of hypertension.  SPECIAL INSTRUCTIONS:  Return if symptoms recur, worsen, or new changes develop.  FOLLOWUP:  Follow up with her primary care physician, I have recommended in the next 10-14 days, also I have recommended that she follow up with her neurologist and neurosurgeon, Dr.  Elsner in 7-10 days.  I have recommended that she have a repeat BMP drawn in approximately 1 week and I have recommended that she have free water restriction of no more than 2 liters per day and to avoid sipping on water all day long because of the hyponatremia that she suffers from.  I explained this to the patient and she verbalized understanding.  On the day of discharge, the patient was awake, alert, cooperative in no distress.  She had washed her hair and was asking when she would be able to go to rehab.  PHYSICAL EXAMINATION:  VITAL SIGNS:   Reviewed.  Her temperature is 97.9, pulse 79, respirations 20, blood pressure 150/90, pulse ox 96% on room air. GENERAL:  The patient was in no distress, cooperative and pleasant, not complaining of pain or respiratory distress or problems. HEENT:  Normocephalic, atraumatic.  Mucous membranes moist. NECK:  Supple.  Thyroid soft, no nodules or masses palpated. LUNGS:  Bilateral breath sounds, clear to auscultation,, no crackles or rhonchi heard. CARDIAC: Normal S1, S2 sounds without murmurs, rubs, or gallops. ABDOMEN:  Soft, nondistended, nontender, no masses palpated. EXTREMITIES:  No pretibial edema, cyanosis, or clubbing. NEUROLOGICAL:  No focal deficits found.  Gait and stability within normal limits.  LABS AT DISCHARGE:  Potassium 4.0.  Urine culture negative for infection.  I spent 32 minutes preparing this patient's discharge including preparing a medication reconciliation form, arranging followup and dictating and counseling the patient regarding psychogenic polydipsia and hyponatremia.     Standley Dakins, MD     CJ/MEDQ  D:  08/30/2010  T:  08/30/2010  Job:  [id number]  cc:   Pam Drown, M.D. Stefani Dama, M.D.Electronically Signed by Standley Dakins  on 08/30/2010 06:27:55 PM

## 2010-09-26 NOTE — Op Note (Signed)
  [name], [name]                ACCOUNT NO.:  [id number]  MEDICAL RECORD NO.:  [id number]           PATIENT TYPE:  I  LOCATION:  2899                         FACILITY:  [facility]  PHYSICIAN:  Stefani Dama, M.D.  DATE OF BIRTH:  Aug 03, 1940  DATE OF PROCEDURE:  08/12/2010 DATE OF DISCHARGE:  08/12/2010                              OPERATIVE REPORT   PREOPERATIVE DIAGNOSIS:  T6 subacute compression fracture secondary to osteoporosis.  POSTOPERATIVE DIAGNOSIS:  T6 subacute compression fracture secondary to osteoporosis.  PROCEDURE:  T6 acrylic balloon kyphoplasty.  SURGEON:  Stefani Dama, MD  INDICATIONS:  [name] is a 70 year old individual who has had significant profound back pain for several weeks time.  She was found to have a subacute compression fracture at T6.  She has had previous fractures at T10.  She has evidence for osteoporosis radiographically and has been advised that acrylic balloon kyphoplasty may help her.This is now being for performed to gain some control of her pain.  PROCEDURE IN DETAIL:  The patient was brought to the operating room supine on the stretcher.  After smooth induction of general endotracheal anesthesia, she was carefully turned into the prone position over some vertical parallel rolls.  The back was then prepped with alcohol and DuraPrep and draped in sterile fashion.  AP and lateral fluoroscopy was brought into view to identify the T6 vertebra.  Then by using the 9 o'clock position on the left-sided pedicle entry site for T6, a Jamshidi needle was placed after numbing the skin with 3 mL of lidocaine with epinephrine 1:100,000.  A probe was passed through the lateral aspect of the rib extrapedicularly into the vertebral body.  Center cannula was then removed and a drill was inserted to the vertebral body.  The bone was found to be semi-rigid and after the bone was drilled, a balloon was placed into this cavity and was inflated with  substantial resistance to 200 mmHg which ultimately decayed about 140 mmHg.  The cement was prepared to the appropriate thickness and consistency and then a total of 3 mL of cement could be injected.  There was some extracavitary flow into the disk space at T5-T6 level and also extraforaminally on the pedicular aspect on the left-sided T6.  At this point, the procedure was completed.  No height was restored.  The patient tolerated the procedure well and was returned to the recovery room in stable condition after placing a singular 3-0 Vicryl suture in the subcuticular tissues.     Stefani Dama, M.D.     [name]  D:  08/12/2010  T:  08/13/2010  Job:  [id number]  Electronically Signed by Barnett Abu M.D. on 09/26/2010 05:01:03 PM

## 2011-01-04 ENCOUNTER — Emergency Department (INDEPENDENT_AMBULATORY_CARE_PROVIDER_SITE_OTHER): Payer: Medicare Other

## 2011-01-04 ENCOUNTER — Encounter: Payer: Self-pay | Admitting: *Deleted

## 2011-01-04 ENCOUNTER — Emergency Department (HOSPITAL_BASED_OUTPATIENT_CLINIC_OR_DEPARTMENT_OTHER)
Admission: EM | Admit: 2011-01-04 | Discharge: 2011-01-04 | Disposition: A | Discharge status: Home / Self Care | Payer: Medicare Other | Source: Home / Self Care | Attending: Emergency Medicine | Admitting: Emergency Medicine

## 2011-01-04 DIAGNOSIS — G319 Degenerative disease of nervous system, unspecified: Secondary | ICD-10-CM

## 2011-01-04 DIAGNOSIS — S0003XA Contusion of scalp, initial encounter: Secondary | ICD-10-CM | POA: Insufficient documentation

## 2011-01-04 DIAGNOSIS — I6789 Other cerebrovascular disease: Secondary | ICD-10-CM

## 2011-01-04 DIAGNOSIS — W19XXXA Unspecified fall, initial encounter: Secondary | ICD-10-CM

## 2011-01-04 DIAGNOSIS — Y92009 Unspecified place in unspecified non-institutional (private) residence as the place of occurrence of the external cause: Secondary | ICD-10-CM | POA: Insufficient documentation

## 2011-01-04 DIAGNOSIS — S1093XA Contusion of unspecified part of neck, initial encounter: Secondary | ICD-10-CM

## 2011-01-04 DIAGNOSIS — W1809XA Striking against other object with subsequent fall, initial encounter: Secondary | ICD-10-CM | POA: Insufficient documentation

## 2011-01-04 DIAGNOSIS — H05239 Hemorrhage of unspecified orbit: Secondary | ICD-10-CM

## 2011-01-04 HISTORY — DX: Essential (primary) hypertension: I10

## 2011-01-04 HISTORY — DX: Dorsalgia, unspecified: M54.9

## 2011-01-04 NOTE — ED Provider Notes (Signed)
History     CSN: [id number] Arrival date & time: 01/04/2011 12:37 PM  Chief Complaint  Patient presents with  . Fall    (Consider location/radiation/quality/duration/timing/severity/associated sxs/prior treatment) HPI Comments: Last night patient's lost her balance and her head ran into a door jam. She denies any loss of consciousness. Patient notes that she chronically has problems with balance do to back pain. She states that her back pain is at its chronic level and is now worse than normal. She has no changes in the level weakness in her legs. No loss of bowel or bladder control. Patient states no visual changes. She went to her primary care physician and they sent her here for evaluation for a head CT. Patient noted the swelling around her left eye this morning and was surprised because she did not realize she had hit her head that her last night. She denies any antiplatelet or anticoagulation agents.  Patient is a 70 y.o. female presenting with fall. The history is provided by the patient. No language interpreter was used.  Fall The accident occurred yesterday. She landed on carpet. There was no blood loss. The point of impact was the head. Pertinent negatives include no fever, no abdominal pain, no nausea, no vomiting and no headaches.    Past Medical History  Diagnosis Date  . Back pain   . Hypertension     Past Surgical History  Procedure Date  . Back surgery     No family history on file.  History  Substance Use Topics  . Smoking status: Current Some Day Smoker  . Smokeless tobacco: Not on file  . Alcohol Use: Yes    OB History    Grav Para Term Preterm Abortions TAB SAB Ect Mult Living                  Review of Systems  Constitutional: Negative.  Negative for fever and chills.  Eyes: Negative.  Negative for discharge and redness.  Respiratory: Negative.  Negative for cough and shortness of breath.   Cardiovascular: Negative.  Negative for chest pain.    Gastrointestinal: Negative.  Negative for nausea, vomiting, abdominal pain and diarrhea.  Genitourinary: Negative.  Negative for dysuria and vaginal discharge.  Musculoskeletal: Negative.  Negative for back pain.  Skin: Negative.  Negative for color change and rash.  Neurological: Negative.  Negative for syncope and headaches.  Hematological: Negative.  Negative for adenopathy.  Psychiatric/Behavioral: Negative.  Negative for confusion.  All other systems reviewed and are negative.    Allergies  Review of patient's allergies indicates no known allergies.  Home Medications   Current Outpatient Rx  Name Route Sig Dispense Refill  . LISINOPRIL PO Oral Take by mouth.      . MULTI-VITAMIN/MINERALS PO TABS Oral Take 1 tablet by mouth daily.      . TRAMADOL HCL PO Oral Take by mouth.        BP 177/103  Pulse 130  Temp(Src) 97.6 F (36.4 C) (Oral)  Resp 19  SpO2 96%  Physical Exam  Constitutional: She is oriented to person, place, and time. She appears well-developed and well-nourished.  Non-toxic appearance. She does not have a sickly appearance.  HENT:  Head: Normocephalic and atraumatic.       Patient has significant hematoma both superior and inferior to her left eye. This hematoma also extends up into her left forehead. There is no laceration. There is a mild abrasion. Patient's extraocular eye movements are intact. Pupils are  equal round reactive to light. No subconjunctival hemorrhages are noted. No deformities on palpation of the zygomatic arch or from the orbit. No deformities round and nasal bones. Clear nasal cavity bilaterally. And patient notes that when she opens and closes her jaw her teeth appear aligned and she has no difficulty with this.  Eyes: Conjunctivae, EOM and lids are normal. Pupils are equal, round, and reactive to light. No scleral icterus.  Neck: Trachea normal and normal range of motion. Neck supple.  Cardiovascular: Normal rate, regular rhythm and normal  heart sounds.   Pulmonary/Chest: Effort normal and breath sounds normal. No respiratory distress.  Abdominal: Soft. Normal appearance. There is no tenderness. There is no rebound, no guarding and no CVA tenderness.  Musculoskeletal: Normal range of motion.  Neurological: She is alert and oriented to person, place, and time. She has normal strength.  Skin: Skin is warm, dry and intact. No rash noted.  Psychiatric: She has a normal mood and affect. Her behavior is normal. Judgment and thought content normal.    ED Course  Procedures (including critical care time)  Results for orders placed during the hospital encounter of 08/26/10  DIFFERENTIAL      Component Value Range   Neutrophils Relative 78 (*) 43 - 77 (%)   Neutro Abs 7.7  1.7 - 7.7 (K/uL)   Lymphocytes Relative 13  12 - 46 (%)   Lymphs Abs 1.3  0.7 - 4.0 (K/uL)   Monocytes Relative 8  3 - 12 (%)   Monocytes Absolute 0.8  0.1 - 1.0 (K/uL)   Eosinophils Relative 0  0 - 5 (%)   Eosinophils Absolute 0.0  0.0 - 0.7 (K/uL)   Basophils Relative 0  0 - 1 (%)   Basophils Absolute 0.0  0.0 - 0.1 (K/uL)  CBC      Component Value Range   WBC 9.9  4.0 - 10.5 (K/uL)   RBC 4.82  3.87 - 5.11 (MIL/uL)   Hemoglobin 16.3 (*) 12.0 - 15.0 (g/dL)   HCT 04.5  40.9 - 81.1 (%)   MCV 92.7  78.0 - 100.0 (fL)   MCH 33.8  26.0 - 34.0 (pg)   MCHC 36.5 (*) 30.0 - 36.0 (g/dL)   RDW 91.4  78.2 - 95.6 (%)   Platelets 397  150 - 400 (K/uL)  D-DIMER, QUANTITATIVE      Component Value Range   D-Dimer, Quant   (*) 0.00 - 0.48 (ug/mL-FEU)   Value: 1.21            AT THE INHOUSE ESTABLISHED CUTOFF     VALUE OF 0.48 ug/mL FEU,     THIS ASSAY HAS BEEN DOCUMENTED     IN THE LITERATURE TO HAVE     A SENSITIVITY AND NEGATIVE     PREDICTIVE VALUE OF AT LEAST     98 TO 99%.  THE TEST RESULT     SHOULD BE CORRELATED WITH     AN ASSESSMENT OF THE CLINICAL     PROBABILITY OF DVT / VTE.  BASIC METABOLIC PANEL      Component Value Range   Sodium 127 (*) 135 -  145 (mEq/L)   Potassium 3.8  3.5 - 5.1 (mEq/L)   Chloride 88 (*) 96 - 112 (mEq/L)   CO2 27  19 - 32 (mEq/L)   Glucose, Bld 79  70 - 99 (mg/dL)   BUN 8  6 - 23 (mg/dL)   Creatinine, Ser 2.13  0.4 - 1.2 (  mg/dL)   Calcium 9.5  8.4 - 16.1 (mg/dL)   GFR calc non Af Amer >60  >60 (mL/min)   GFR calc Af Amer    >60 (mL/min)   Value: >60            The eGFR has been calculated     using the MDRD equation.     This calculation has not been     validated in all clinical     situations.     eGFR's persistently     <60 mL/min signify     possible Chronic Kidney Disease.  CK TOTAL AND CKMB      Component Value Range   Total CK 666 (*) 7 - 177 (U/L)   CK, MB   (*) 0.3 - 4.0 (ng/mL)   Value: 8.9 CRITICAL RESULT CALLED TO, READ BACK BY AND VERIFIED WITH: RN B. Verne Carrow 08/26/10 2034 Sallyanne Kuster M.   Relative Index 1.3  0.0 - 2.5   TROPONIN I      Component Value Range   Troponin I    <0.30 (ng/mL)   Value: <0.30            Due to the release kinetics of cTnI,     a negative result within the first hours     of the onset of symptoms does not rule out     myocardial infarction with certainty.     If myocardial infarction is still suspected,     repeat the test at appropriate intervals.  MRSA PCR SCREENING      Component Value Range   MRSA by PCR    NEGATIVE    Value: NEGATIVE            The GeneXpert MRSA Assay (FDA     approved for NASAL specimens     only), is one component of a     comprehensive MRSA colonization     surveillance program. It is not     intended to diagnose MRSA     infection nor to guide or     monitor treatment for     MRSA infections.  DIFFERENTIAL      Component Value Range   Neutrophils Relative 67  43 - 77 (%)   Neutro Abs 6.0  1.7 - 7.7 (K/uL)   Lymphocytes Relative 22  12 - 46 (%)   Lymphs Abs 2.0  0.7 - 4.0 (K/uL)   Monocytes Relative 10  3 - 12 (%)   Monocytes Absolute 0.9  0.1 - 1.0 (K/uL)   Eosinophils Relative 1  0 - 5 (%)   Eosinophils Absolute 0.1   0.0 - 0.7 (K/uL)   Basophils Relative 0  0 - 1 (%)   Basophils Absolute 0.0  0.0 - 0.1 (K/uL)  CBC      Component Value Range   WBC 9.1  4.0 - 10.5 (K/uL)   RBC 4.49  3.87 - 5.11 (MIL/uL)   Hemoglobin 14.4  12.0 - 15.0 (g/dL)   HCT 09.6  04.5 - 40.9 (%)   MCV 91.8  78.0 - 100.0 (fL)   MCH 32.1  26.0 - 34.0 (pg)   MCHC 35.0  30.0 - 36.0 (g/dL)   RDW 81.1  91.4 - 78.2 (%)   Platelets 400  150 - 400 (K/uL)  CARDIAC PANEL(CRET KIN+CKTOT+MB+TROPI)      Component Value Range   Total CK 538 (*) 7 - 177 (U/L)   CK, MB   (*) 0.3 -  4.0 (ng/mL)   Value: 7.5 CRITICAL VALUE NOTED.  VALUE IS CONSISTENT WITH PREVIOUSLY REPORTED AND CALLED VALUE.   Troponin I    <0.30 (ng/mL)   Value: <0.30            Due to the release kinetics of cTnI,     a negative result within the first hours     of the onset of symptoms does not rule out     myocardial infarction with certainty.     If myocardial infarction is still suspected,     repeat the test at appropriate intervals.   Relative Index 1.4  0.0 - 2.5   COMPREHENSIVE METABOLIC PANEL      Component Value Range   Sodium 126 (*) 135 - 145 (mEq/L)   Potassium 3.4 (*) 3.5 - 5.1 (mEq/L)   Chloride 92 (*) 96 - 112 (mEq/L)   CO2 23  19 - 32 (mEq/L)   Glucose, Bld 76  70 - 99 (mg/dL)   BUN 9  6 - 23 (mg/dL)   Creatinine, Ser <1.61  0.4 - 1.2 (mg/dL)   Calcium 8.1 (*) 8.4 - 10.5 (mg/dL)   Total Protein 5.5 (*) 6.0 - 8.3 (g/dL)   Albumin 2.9 (*) 3.5 - 5.2 (g/dL)   AST 36  0 - 37 (U/L)   ALT 26  0 - 35 (U/L)   Alkaline Phosphatase 68  39 - 117 (U/L)   Total Bilirubin 0.7  0.3 - 1.2 (mg/dL)   GFR calc non Af Amer NOT CALCULATED  >60 (mL/min)   GFR calc Af Amer    >60 (mL/min)   Value: NOT CALCULATED            The eGFR has been calculated     using the MDRD equation.     This calculation has not been     validated in all clinical     situations.     eGFR's persistently     <60 mL/min signify     possible Chronic Kidney Disease.  MAGNESIUM       Component Value Range   Magnesium 2.0  1.5 - 2.5 (mg/dL)  TSH      Component Value Range   TSH 1.012  0.350 - 4.500 (uIU/mL)  CARDIAC PANEL(CRET KIN+CKTOT+MB+TROPI)      Component Value Range   Total CK 420 (*) 7 - 177 (U/L)   CK, MB   (*) 0.3 - 4.0 (ng/mL)   Value: 6.1 CRITICAL VALUE NOTED.  VALUE IS CONSISTENT WITH PREVIOUSLY REPORTED AND CALLED VALUE.   Troponin I    <0.30 (ng/mL)   Value: <0.30            Due to the release kinetics of cTnI,     a negative result within the first hours     of the onset of symptoms does not rule out     myocardial infarction with certainty.     If myocardial infarction is still suspected,     repeat the test at appropriate intervals.   Relative Index 1.5  0.0 - 2.5   T4, FREE      Component Value Range   Free T4 1.48  0.80 - 1.80 (ng/dL)  T3, FREE      Component Value Range   T3, Free 2.2 (*) 2.3 - 4.2 (pg/mL)  URINALYSIS, ROUTINE W REFLEX MICROSCOPIC      Component Value Range   Color, Urine YELLOW  YELLOW    Appearance CLEAR  CLEAR    Specific Gravity, Urine 1.011  1.005 - 1.030    pH 6.0  5.0 - 8.0    Glucose, UA NEGATIVE  NEGATIVE (mg/dL)   Hgb urine dipstick NEGATIVE  NEGATIVE    Bilirubin Urine NEGATIVE  NEGATIVE    Ketones, ur NEGATIVE  NEGATIVE (mg/dL)   Protein, ur NEGATIVE  NEGATIVE (mg/dL)   Urobilinogen, UA 0.2  0.0 - 1.0 (mg/dL)   Nitrite NEGATIVE  NEGATIVE    Leukocytes, UA    NEGATIVE    Value: NEGATIVE MICROSCOPIC NOT DONE ON URINES WITH NEGATIVE PROTEIN, BLOOD, LEUKOCYTES, NITRITE, OR GLUCOSE <1000 mg/dL.  BASIC METABOLIC PANEL      Component Value Range   Sodium 126 (*) 135 - 145 (mEq/L)   Potassium 3.4 (*) 3.5 - 5.1 (mEq/L)   Chloride 92 (*) 96 - 112 (mEq/L)   CO2 26  19 - 32 (mEq/L)   Glucose, Bld 119 (*) 70 - 99 (mg/dL)   BUN 4 (*) 6 - 23 (mg/dL)   Creatinine, Ser <4.09  0.4 - 1.2 (mg/dL)   Calcium 8.3 (*) 8.4 - 10.5 (mg/dL)   GFR calc non Af Amer NOT CALCULATED  >60 (mL/min)   GFR calc Af Amer    >60  (mL/min)   Value: NOT CALCULATED            The eGFR has been calculated     using the MDRD equation.     This calculation has not been     validated in all clinical     situations.     eGFR's persistently     <60 mL/min signify     possible Chronic Kidney Disease.  CK      Component Value Range   Total CK 315 (*) 7 - 177 (U/L)  BASIC METABOLIC PANEL      Component Value Range   Sodium 129 (*) 135 - 145 (mEq/L)   Potassium 3.1 (*) 3.5 - 5.1 (mEq/L)   Chloride 93 (*) 96 - 112 (mEq/L)   CO2 27  19 - 32 (mEq/L)   Glucose, Bld 96  70 - 99 (mg/dL)   BUN 3 (*) 6 - 23 (mg/dL)   Creatinine, Ser <8.11  0.4 - 1.2 (mg/dL)   Calcium 8.4  8.4 - 91.4 (mg/dL)   GFR calc non Af Amer NOT CALCULATED  >60 (mL/min)   GFR calc Af Amer    >60 (mL/min)   Value: NOT CALCULATED            The eGFR has been calculated     using the MDRD equation.     This calculation has not been     validated in all clinical     situations.     eGFR's persistently     <60 mL/min signify     possible Chronic Kidney Disease.  CK      Component Value Range   Total CK 486 (*) 7 - 177 (U/L)  URINE CULTURE      Component Value Range   Specimen Description URINE, CLEAN CATCH     Special Requests NONE     Setup Time [id number]     Colony Count NO GROWTH     Culture NO GROWTH     Report Status 08/29/2010 FINAL    POTASSIUM      Component Value Range   Potassium 4.0  3.5 - 5.1 (mEq/L)   Ct Head Wo Contrast  01/04/2011  *RADIOLOGY REPORT*  Clinical  Data:  Fall with head and facial trauma.  CT HEAD WITHOUT CONTRAST CT MAXILLOFACIAL WITHOUT CONTRAST  Technique:  Multidetector CT imaging of the head and maxillofacial structures were performed using the standard protocol without intravenous contrast. Multiplanar CT image reconstructions of the maxillofacial structures were also generated.  Comparison:  No comparison studies available.  CT HEAD  Findings: There is no evidence for acute hemorrhage, hydrocephalus, mass  lesion, or abnormal extra-axial fluid collection.  No definite CT evidence for acute infarction.  Diffuse loss of parenchymal volume is consistent with atrophy. Patchy low attenuation in the deep hemispheric and periventricular white matter is nonspecific, but likely reflects chronic microvascular ischemic demyelination. Left frontal scalp contusion is evident.  IMPRESSION: No acute intracranial abnormality.  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  Left frontal scalp contusion without underlying skull fracture.  CT MAXILLOFACIAL  Findings:   There is no evidence for an acute facial bone fracture. Specifically, the mandible is intact and the temporomandibular joints are located.  There is no inferior or medial orbital wall blowout fracture.  Zygomatic arches are intact.  No air-fluid levels in the paranasal sinuses to suggest hemorrhage.  IMPRESSION: No evidence for an acute facial fracture.  Original Report Authenticated By: ERIC A. MANSELL, M.D.   Ct Maxillofacial Wo Cm  01/04/2011  *RADIOLOGY REPORT*  Clinical Data:  Fall with head and facial trauma.  CT HEAD WITHOUT CONTRAST CT MAXILLOFACIAL WITHOUT CONTRAST  Technique:  Multidetector CT imaging of the head and maxillofacial structures were performed using the standard protocol without intravenous contrast. Multiplanar CT image reconstructions of the maxillofacial structures were also generated.  Comparison:  No comparison studies available.  CT HEAD  Findings: There is no evidence for acute hemorrhage, hydrocephalus, mass lesion, or abnormal extra-axial fluid collection.  No definite CT evidence for acute infarction.  Diffuse loss of parenchymal volume is consistent with atrophy. Patchy low attenuation in the deep hemispheric and periventricular white matter is nonspecific, but likely reflects chronic microvascular ischemic demyelination. Left frontal scalp contusion is evident.  IMPRESSION: No acute intracranial abnormality.   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  Left frontal scalp contusion without underlying skull fracture.  CT MAXILLOFACIAL  Findings:   There is no evidence for an acute facial bone fracture. Specifically, the mandible is intact and the temporomandibular joints are located.  There is no inferior or medial orbital wall blowout fracture.  Zygomatic arches are intact.  No air-fluid levels in the paranasal sinuses to suggest hemorrhage.  IMPRESSION: No evidence for an acute facial fracture.  Original Report Authenticated By: ERIC A. MANSELL, M.D.       MDM  Patient with a fall without loss of consciousness with what appears to be only superficial hematoma around her left eye and forehead. She has no signs of intracranial hemorrhage on her CT. No signs of any facial bone fracture on her exam or on CT. Patient is in her normal mental status at this point in time she is accompanied by her sister-in-law who confirms that the patient is at her baseline mental status as well. Patient to be discharged home with followup with her primary care physician.        Nat Christen, MD 01/04/11 [phone]

## 2011-01-04 NOTE — ED Notes (Signed)
Per patient she fell last night but does not remember any details, called family member this morning because she noticed bruising to face, c/o back and head pain

## 2011-01-06 ENCOUNTER — Emergency Department (HOSPITAL_COMMUNITY): Payer: Medicare Other

## 2011-01-06 ENCOUNTER — Inpatient Hospital Stay (HOSPITAL_COMMUNITY)
Admission: EM | Admit: 2011-01-06 | Discharge: 2011-01-15 | DRG: 190 | Disposition: A | Discharge status: Skilled Nursing Facility | Payer: Medicare Other | Attending: Internal Medicine | Admitting: Internal Medicine

## 2011-01-06 DIAGNOSIS — R631 Polydipsia: Secondary | ICD-10-CM | POA: Diagnosis present

## 2011-01-06 DIAGNOSIS — M6282 Rhabdomyolysis: Secondary | ICD-10-CM | POA: Diagnosis present

## 2011-01-06 DIAGNOSIS — S0003XA Contusion of scalp, initial encounter: Secondary | ICD-10-CM | POA: Diagnosis present

## 2011-01-06 DIAGNOSIS — J189 Pneumonia, unspecified organism: Secondary | ICD-10-CM | POA: Diagnosis present

## 2011-01-06 DIAGNOSIS — R7401 Elevation of levels of liver transaminase levels: Secondary | ICD-10-CM | POA: Diagnosis present

## 2011-01-06 DIAGNOSIS — E236 Other disorders of pituitary gland: Secondary | ICD-10-CM | POA: Diagnosis present

## 2011-01-06 DIAGNOSIS — F17200 Nicotine dependence, unspecified, uncomplicated: Secondary | ICD-10-CM | POA: Diagnosis present

## 2011-01-06 DIAGNOSIS — Z01812 Encounter for preprocedural laboratory examination: Secondary | ICD-10-CM

## 2011-01-06 DIAGNOSIS — I10 Essential (primary) hypertension: Secondary | ICD-10-CM | POA: Diagnosis present

## 2011-01-06 DIAGNOSIS — R7402 Elevation of levels of lactic acid dehydrogenase (LDH): Secondary | ICD-10-CM | POA: Diagnosis present

## 2011-01-06 DIAGNOSIS — E46 Unspecified protein-calorie malnutrition: Secondary | ICD-10-CM | POA: Diagnosis present

## 2011-01-06 DIAGNOSIS — I498 Other specified cardiac arrhythmias: Secondary | ICD-10-CM | POA: Diagnosis not present

## 2011-01-06 DIAGNOSIS — J9600 Acute respiratory failure, unspecified whether with hypoxia or hypercapnia: Secondary | ICD-10-CM | POA: Diagnosis present

## 2011-01-06 DIAGNOSIS — Z7982 Long term (current) use of aspirin: Secondary | ICD-10-CM

## 2011-01-06 DIAGNOSIS — D72829 Elevated white blood cell count, unspecified: Secondary | ICD-10-CM | POA: Diagnosis present

## 2011-01-06 DIAGNOSIS — F411 Generalized anxiety disorder: Secondary | ICD-10-CM | POA: Diagnosis present

## 2011-01-06 DIAGNOSIS — Z9181 History of falling: Secondary | ICD-10-CM

## 2011-01-06 DIAGNOSIS — A498 Other bacterial infections of unspecified site: Secondary | ICD-10-CM | POA: Diagnosis present

## 2011-01-06 DIAGNOSIS — B999 Unspecified infectious disease: Secondary | ICD-10-CM | POA: Diagnosis present

## 2011-01-06 DIAGNOSIS — R5381 Other malaise: Secondary | ICD-10-CM | POA: Diagnosis present

## 2011-01-06 DIAGNOSIS — J441 Chronic obstructive pulmonary disease with (acute) exacerbation: Principal | ICD-10-CM | POA: Diagnosis present

## 2011-01-06 DIAGNOSIS — N390 Urinary tract infection, site not specified: Secondary | ICD-10-CM | POA: Diagnosis present

## 2011-01-06 DIAGNOSIS — T380X5A Adverse effect of glucocorticoids and synthetic analogues, initial encounter: Secondary | ICD-10-CM | POA: Diagnosis present

## 2011-01-06 DIAGNOSIS — Z79899 Other long term (current) drug therapy: Secondary | ICD-10-CM

## 2011-01-06 DIAGNOSIS — E876 Hypokalemia: Secondary | ICD-10-CM | POA: Diagnosis not present

## 2011-01-06 HISTORY — DX: Chronic obstructive pulmonary disease, unspecified: J44.9

## 2011-01-06 HISTORY — DX: Anxiety disorder, unspecified: F41.9

## 2011-01-06 LAB — CBC
HCT: 44.4 % (ref 36.0–46.0)
Hemoglobin: 16.2 g/dL — ABNORMAL HIGH (ref 12.0–15.0)
MCHC: 36.5 g/dL — ABNORMAL HIGH (ref 30.0–36.0)
RBC: 5 MIL/uL (ref 3.87–5.11)

## 2011-01-06 LAB — DIFFERENTIAL
Basophils Absolute: 0 10*3/uL (ref 0.0–0.1)
Lymphocytes Relative: 3 % — ABNORMAL LOW (ref 12–46)
Monocytes Absolute: 1.5 10*3/uL — ABNORMAL HIGH (ref 0.1–1.0)
Monocytes Relative: 8 % (ref 3–12)
Neutro Abs: 16.2 10*3/uL — ABNORMAL HIGH (ref 1.7–7.7)

## 2011-01-06 LAB — CK TOTAL AND CKMB (NOT AT ARMC)
CK, MB: 9.4 ng/mL (ref 0.3–4.0)
Relative Index: 0.9 (ref 0.0–2.5)
Total CK: 1046 U/L — ABNORMAL HIGH (ref 7–177)

## 2011-01-06 LAB — URINALYSIS, ROUTINE W REFLEX MICROSCOPIC
Glucose, UA: NEGATIVE mg/dL
Leukocytes, UA: NEGATIVE
Specific Gravity, Urine: 1.013 (ref 1.005–1.030)
Urobilinogen, UA: 0.2 mg/dL (ref 0.0–1.0)

## 2011-01-06 LAB — POCT I-STAT 3, ART BLOOD GAS (G3+)
Bicarbonate: 21.5 mEq/L (ref 20.0–24.0)
TCO2: 22 mmol/L (ref 0–100)
pH, Arterial: 7.442 — ABNORMAL HIGH (ref 7.350–7.400)
pO2, Arterial: 56 mmHg — ABNORMAL LOW (ref 80.0–100.0)

## 2011-01-06 LAB — COMPREHENSIVE METABOLIC PANEL
AST: 69 U/L — ABNORMAL HIGH (ref 0–37)
Albumin: 3 g/dL — ABNORMAL LOW (ref 3.5–5.2)
Calcium: 9.5 mg/dL (ref 8.4–10.5)
Creatinine, Ser: 0.66 mg/dL (ref 0.50–1.10)
GFR calc non Af Amer: 86 mL/min — ABNORMAL LOW (ref >90)

## 2011-01-06 LAB — LIPID PANEL
Cholesterol: 170 mg/dL (ref 0–200)
HDL: 112 mg/dL (ref >39)
Total CHOL/HDL Ratio: 1.5 RATIO
Triglycerides: 42 mg/dL (ref <150)
VLDL: 8 mg/dL (ref 0–40)

## 2011-01-06 LAB — URINE MICROSCOPIC-ADD ON

## 2011-01-06 LAB — OSMOLALITY, URINE: Osmolality, Ur: 380 mOsm/kg — ABNORMAL LOW (ref 390–1090)

## 2011-01-06 LAB — PROTIME-INR: INR: 1.3 (ref 0.00–1.49)

## 2011-01-06 LAB — POCT I-STAT TROPONIN I

## 2011-01-06 LAB — TROPONIN I: Troponin I: <0.3 ng/mL (ref <0.30)

## 2011-01-06 LAB — CK: Total CK: 1393 U/L — ABNORMAL HIGH (ref 7–177)

## 2011-01-07 ENCOUNTER — Inpatient Hospital Stay (HOSPITAL_COMMUNITY): Payer: Medicare Other

## 2011-01-07 LAB — COMPREHENSIVE METABOLIC PANEL WITH GFR
ALT: 39 U/L — ABNORMAL HIGH (ref 0–35)
AST: 45 U/L — ABNORMAL HIGH (ref 0–37)
Albumin: 2.7 g/dL — ABNORMAL LOW (ref 3.5–5.2)
Alkaline Phosphatase: 81 U/L (ref 39–117)
BUN: 19 mg/dL (ref 6–23)
CO2: 24 meq/L (ref 19–32)
Calcium: 9.3 mg/dL (ref 8.4–10.5)
Chloride: 94 meq/L — ABNORMAL LOW (ref 96–112)
Creatinine, Ser: 0.54 mg/dL (ref 0.50–1.10)
GFR calc Af Amer: >90 mL/min
GFR calc non Af Amer: >90 mL/min
Glucose, Bld: 173 mg/dL — ABNORMAL HIGH (ref 70–99)
Potassium: 3.4 meq/L — ABNORMAL LOW (ref 3.5–5.1)
Sodium: 128 meq/L — ABNORMAL LOW (ref 135–145)
Total Bilirubin: 0.6 mg/dL (ref 0.3–1.2)
Total Protein: 6.1 g/dL (ref 6.0–8.3)

## 2011-01-07 LAB — CARDIAC PANEL(CRET KIN+CKTOT+MB+TROPI)
CK, MB: 5 ng/mL — ABNORMAL HIGH (ref 0.3–4.0)
Relative Index: 0.9 (ref 0.0–2.5)
Total CK: 570 U/L — ABNORMAL HIGH (ref 7–177)
Troponin I: <0.3 ng/mL

## 2011-01-07 LAB — CBC
HCT: 39 % (ref 36.0–46.0)
Hemoglobin: 13.4 g/dL (ref 12.0–15.0)
MCH: 30 pg (ref 26.0–34.0)
MCHC: 34.4 g/dL (ref 30.0–36.0)
MCV: 87.4 fL (ref 78.0–100.0)
Platelets: 344 10*3/uL (ref 150–400)
RBC: 4.46 MIL/uL (ref 3.87–5.11)
RDW: 13.1 % (ref 11.5–15.5)
WBC: 12.9 10*3/uL — ABNORMAL HIGH (ref 4.0–10.5)

## 2011-01-07 LAB — VITAMIN B12: Vitamin B-12: >2000 pg/mL — ABNORMAL HIGH (ref 211–911)

## 2011-01-08 LAB — COMPREHENSIVE METABOLIC PANEL
AST: 37 U/L (ref 0–37)
Albumin: 2.7 g/dL — ABNORMAL LOW (ref 3.5–5.2)
BUN: 24 mg/dL — ABNORMAL HIGH (ref 6–23)
Calcium: 9.1 mg/dL (ref 8.4–10.5)
Chloride: 94 mEq/L — ABNORMAL LOW (ref 96–112)
Creatinine, Ser: 0.67 mg/dL (ref 0.50–1.10)
Total Protein: 5.8 g/dL — ABNORMAL LOW (ref 6.0–8.3)

## 2011-01-08 LAB — CBC
MCH: 30 pg (ref 26.0–34.0)
MCHC: 34.1 g/dL (ref 30.0–36.0)
MCV: 88 fL (ref 78.0–100.0)
Platelets: 366 10*3/uL (ref 150–400)
RDW: 13.4 % (ref 11.5–15.5)
WBC: 12.9 10*3/uL — ABNORMAL HIGH (ref 4.0–10.5)

## 2011-01-08 LAB — T3, FREE: T3, Free: 1.6 pg/mL — ABNORMAL LOW (ref 2.3–4.2)

## 2011-01-08 LAB — URINE CULTURE: Colony Count: >=100000

## 2011-01-08 LAB — TSH: TSH: 0.18 u[IU]/mL — ABNORMAL LOW (ref 0.350–4.500)

## 2011-01-08 LAB — T4, FREE: Free T4: 1.28 ng/dL (ref 0.80–1.80)

## 2011-01-09 LAB — BASIC METABOLIC PANEL
BUN: 22 mg/dL (ref 6–23)
Chloride: 93 mEq/L — ABNORMAL LOW (ref 96–112)
GFR calc Af Amer: >90 mL/min (ref >90)
GFR calc non Af Amer: 87 mL/min — ABNORMAL LOW (ref >90)
Glucose, Bld: 98 mg/dL (ref 70–99)
Potassium: 3.3 mEq/L — ABNORMAL LOW (ref 3.5–5.1)
Sodium: 129 mEq/L — ABNORMAL LOW (ref 135–145)

## 2011-01-09 LAB — DIFFERENTIAL
Basophils Absolute: 0 10*3/uL (ref 0.0–0.1)
Basophils Relative: 0 % (ref 0–1)
Lymphocytes Relative: 8 % — ABNORMAL LOW (ref 12–46)
Neutro Abs: 12.6 10*3/uL — ABNORMAL HIGH (ref 1.7–7.7)
Neutrophils Relative %: 82 % — ABNORMAL HIGH (ref 43–77)

## 2011-01-09 LAB — PHOSPHORUS: Phosphorus: 2.8 mg/dL (ref 2.3–4.6)

## 2011-01-09 LAB — CBC
HCT: 41.1 % (ref 36.0–46.0)
Hemoglobin: 14.2 g/dL (ref 12.0–15.0)
MCHC: 34.5 g/dL (ref 30.0–36.0)
WBC: 15.3 10*3/uL — ABNORMAL HIGH (ref 4.0–10.5)

## 2011-01-10 ENCOUNTER — Encounter (HOSPITAL_COMMUNITY): Payer: Self-pay

## 2011-01-10 ENCOUNTER — Inpatient Hospital Stay (HOSPITAL_COMMUNITY): Payer: Medicare Other

## 2011-01-10 LAB — DIFFERENTIAL
Basophils Relative: 1 % (ref 0–1)
Eosinophils Absolute: 0 10*3/uL (ref 0.0–0.7)
Lymphocytes Relative: 12 % (ref 12–46)
Monocytes Absolute: 1.6 10*3/uL — ABNORMAL HIGH (ref 0.1–1.0)
Neutrophils Relative %: 76 % (ref 43–77)

## 2011-01-10 LAB — BASIC METABOLIC PANEL
Chloride: 94 mEq/L — ABNORMAL LOW (ref 96–112)
GFR calc Af Amer: >90 mL/min (ref >90)
GFR calc non Af Amer: >90 mL/min (ref >90)
Potassium: 4.2 mEq/L (ref 3.5–5.1)
Sodium: 129 mEq/L — ABNORMAL LOW (ref 135–145)

## 2011-01-10 LAB — CBC
Platelets: 381 10*3/uL (ref 150–400)
RBC: 4.99 MIL/uL (ref 3.87–5.11)
RDW: 13.7 % (ref 11.5–15.5)
WBC: 14.9 10*3/uL — ABNORMAL HIGH (ref 4.0–10.5)

## 2011-01-10 LAB — PRO B NATRIURETIC PEPTIDE: Pro B Natriuretic peptide (BNP): 4152 pg/mL — ABNORMAL HIGH (ref 0–125)

## 2011-01-10 MED ORDER — IOHEXOL 300 MG/ML  SOLN
100.0000 mL | Freq: Once | INTRAMUSCULAR | Status: AC | PRN
  Administered 2011-01-10: 100 mL via INTRAVENOUS

## 2011-01-11 LAB — BASIC METABOLIC PANEL
CO2: 27 mEq/L (ref 19–32)
Chloride: 91 mEq/L — ABNORMAL LOW (ref 96–112)
Creatinine, Ser: 0.53 mg/dL (ref 0.50–1.10)
GFR calc Af Amer: >90 mL/min (ref >90)
Potassium: 4.2 mEq/L (ref 3.5–5.1)
Sodium: 128 mEq/L — ABNORMAL LOW (ref 135–145)

## 2011-01-12 LAB — CBC
Hemoglobin: 15.4 g/dL — ABNORMAL HIGH (ref 12.0–15.0)
MCV: 89.6 fL (ref 78.0–100.0)
Platelets: 400 10*3/uL (ref 150–400)
RBC: 5.09 MIL/uL (ref 3.87–5.11)
WBC: 20.4 10*3/uL — ABNORMAL HIGH (ref 4.0–10.5)

## 2011-01-12 LAB — BASIC METABOLIC PANEL
CO2: 28 mEq/L (ref 19–32)
Chloride: 87 mEq/L — ABNORMAL LOW (ref 96–112)
Creatinine, Ser: 0.62 mg/dL (ref 0.50–1.10)
Glucose, Bld: 94 mg/dL (ref 70–99)

## 2011-01-12 LAB — CULTURE, BLOOD (ROUTINE X 2)
Culture  Setup Time: 201210151704
Culture: NO GROWTH

## 2011-01-12 LAB — DIFFERENTIAL
Eosinophils Absolute: 0.1 10*3/uL (ref 0.0–0.7)
Lymphocytes Relative: 9 % — ABNORMAL LOW (ref 12–46)
Lymphs Abs: 1.9 10*3/uL (ref 0.7–4.0)
Neutro Abs: 17 10*3/uL — ABNORMAL HIGH (ref 1.7–7.7)
Neutrophils Relative %: 83 % — ABNORMAL HIGH (ref 43–77)

## 2011-01-13 LAB — BASIC METABOLIC PANEL
BUN: 15 mg/dL (ref 6–23)
CO2: 25 mEq/L (ref 19–32)
Chloride: 90 mEq/L — ABNORMAL LOW (ref 96–112)
Glucose, Bld: 128 mg/dL — ABNORMAL HIGH (ref 70–99)
Potassium: 4.7 mEq/L (ref 3.5–5.1)

## 2011-01-13 LAB — CBC
HCT: 47.3 % — ABNORMAL HIGH (ref 36.0–46.0)
Hemoglobin: 17 g/dL — ABNORMAL HIGH (ref 12.0–15.0)
WBC: 19.8 10*3/uL — ABNORMAL HIGH (ref 4.0–10.5)

## 2011-01-13 LAB — DIFFERENTIAL
Basophils Absolute: 0 10*3/uL (ref 0.0–0.1)
Lymphocytes Relative: 3 % — ABNORMAL LOW (ref 12–46)
Lymphs Abs: 0.7 10*3/uL (ref 0.7–4.0)
Monocytes Absolute: 0.7 10*3/uL (ref 0.1–1.0)
Neutro Abs: 18.4 10*3/uL — ABNORMAL HIGH (ref 1.7–7.7)

## 2011-01-13 LAB — SODIUM, URINE, RANDOM: Sodium, Ur: 92 mEq/L

## 2011-01-13 LAB — OSMOLALITY, URINE: Osmolality, Ur: 434 mOsm/kg (ref 390–1090)

## 2011-01-14 LAB — COMPREHENSIVE METABOLIC PANEL
ALT: 27 U/L (ref 0–35)
Alkaline Phosphatase: 65 U/L (ref 39–117)
BUN: 13 mg/dL (ref 6–23)
Chloride: 94 mEq/L — ABNORMAL LOW (ref 96–112)
GFR calc Af Amer: >90 mL/min (ref >90)
Glucose, Bld: 77 mg/dL (ref 70–99)
Potassium: 3.7 mEq/L (ref 3.5–5.1)
Sodium: 128 mEq/L — ABNORMAL LOW (ref 135–145)
Total Bilirubin: 0.6 mg/dL (ref 0.3–1.2)
Total Protein: 5.2 g/dL — ABNORMAL LOW (ref 6.0–8.3)

## 2011-01-14 LAB — DIFFERENTIAL
Basophils Absolute: 0 10*3/uL (ref 0.0–0.1)
Basophils Relative: 0 % (ref 0–1)
Lymphocytes Relative: 11 % — ABNORMAL LOW (ref 12–46)
Neutro Abs: 13.8 10*3/uL — ABNORMAL HIGH (ref 1.7–7.7)
Neutrophils Relative %: 80 % — ABNORMAL HIGH (ref 43–77)

## 2011-01-14 LAB — CBC
HCT: 43.3 % (ref 36.0–46.0)
Hemoglobin: 15.4 g/dL — ABNORMAL HIGH (ref 12.0–15.0)
RBC: 4.83 MIL/uL (ref 3.87–5.11)
WBC: 17.2 10*3/uL — ABNORMAL HIGH (ref 4.0–10.5)

## 2011-01-15 LAB — DIFFERENTIAL
Basophils Relative: 0 % (ref 0–1)
Eosinophils Absolute: 0.2 10*3/uL (ref 0.0–0.7)
Eosinophils Relative: 1 % (ref 0–5)
Lymphs Abs: 1.7 10*3/uL (ref 0.7–4.0)
Monocytes Relative: 8 % (ref 3–12)
Neutrophils Relative %: 81 % — ABNORMAL HIGH (ref 43–77)

## 2011-01-15 LAB — CBC
MCH: 31.8 pg (ref 26.0–34.0)
MCV: 90.3 fL (ref 78.0–100.0)
Platelets: 438 10*3/uL — ABNORMAL HIGH (ref 150–400)
RBC: 4.87 MIL/uL (ref 3.87–5.11)
RDW: 13.6 % (ref 11.5–15.5)

## 2011-01-15 LAB — BASIC METABOLIC PANEL
CO2: 29 mEq/L (ref 19–32)
Calcium: 8.7 mg/dL (ref 8.4–10.5)
Creatinine, Ser: 0.63 mg/dL (ref 0.50–1.10)
GFR calc Af Amer: >90 mL/min (ref >90)
GFR calc non Af Amer: 87 mL/min — ABNORMAL LOW (ref >90)
Sodium: 128 mEq/L — ABNORMAL LOW (ref 135–145)

## 2011-01-16 NOTE — H&P (Signed)
[name], [name]                ACCOUNT NO.:  [id number]  MEDICAL RECORD NO.:  [id number]  LOCATION:  [facility]                         FACILITY:  [facility]  PHYSICIAN:  Osvaldo Shipper, MD     DATE OF BIRTH:  1941/02/10  DATE OF ADMISSION:  01/06/2011 DATE OF DISCHARGE:                             HISTORY & PHYSICAL   PATIENT'S PRIMARY CARE PHYSICIAN:  Pam Drown, MD, with [facility].  She does not see any other kind of specialist.  The patient was last admitted to our system in June with concerns about palpitations.  She had workup which did not reveal any clear etiology and was discharged home subsequently.  ADMISSION DIAGNOSES: 1. Acute chronic obstructive pulmonary disease exacerbation. 2. Frequent falls at home. 3. Mildly elevated troponin requiring followup. 4. 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 5. History of anxiety disorder. 6. History of hypertension. 7. Mild rhabdomyolysis.  CHIEF COMPLAINT:  Falls at home.  HISTORY OF PRESENT ILLNESS:  The patient is a 70 year old Caucasian female who lives by herself, and apparently has been having frequent falls over the weekend.  This is according to the neighbor.  The neighbor found the patient sitting on the floor.  She apparently has not been able to sleep well for the past many days and has been nodding on and off at night.  She is also complaining of extreme weakness.  There was also complaints of shortness of breath.  She denies any chest pain. She has been having a cough without any expectoration.  Denies any fever or chills.  No nausea, vomiting, or abdominal pain.  No dysuria or hematuria.  There is no history of any sick contacts.  Denies any weakness in one side of the body, just complaints of generalized weakness.  Denies any headaches.  No dizziness or lightheadedness currently.  MEDICATIONS AT HOME:  Unfortunately, we do not have her home medication list.  ALLERGIES:  NO KNOWN DRUG  ALLERGIES.  PAST MEDICAL HISTORY: 1. Recent vertebral compression fractures status post kyphoplasty. 2. History of hypertension. 3. 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 4. History of anxiety disorder. 5. COPD. 6. Echocardiogram done in June did show grade I diastolic dysfunction.     Systolic function was 55% to 65% without any regional wall motion     abnormalities.  SOCIAL HISTORY:  She lives by herself in [place].  She does not have any children.  The closest family she has a sister-in-law which is her brother's wife.  Her brother passed away from pancreatic cancer.  She admits to smoking 2 packs of cigarettes every week, has been a heavy smoker in the past and has been smoking for many, many years.  So, presumably she has a greater than 70-80 pack-year history of smoking. She admits to drinking 2 glasses of wine on a daily basis.  Denies any other alcohol use.  No illicit drug use.  FAMILY HISTORY:  Positive for brother who died of pancreatic cancer.  REVIEW OF SYSTEMS:  Unable to do because of her shortness of breath.  PHYSICAL EXAMINATION:  VITAL SIGNS:  Temperature 98.6 rectally, blood pressure 125/75, heart rate 130s irregular,  respiratory rate is 24, and saturation 100% on room air. GENERAL EXAM:  This is a thin white female in no distress. HEENT:  She has got a bruise on the left side of her face around the orbit, so it is a periorbital bruise, also extending to the scalp as well as going down somewhat into the face.  This area is mildly tender, especially over the scalp area, but she has good extraocular movements. No pallor and no icterus otherwise.  Oral mucous membranes slightly dry. No oral lesions are noted. NECK:  Soft and supple.  No thyromegaly is appreciated.  No cervical, supraclavicular, or inguinal lymphadenopathy is present. LUNGS:  Decreased air entry at the bases with a few wheezes, but no crackles are  present. CARDIOVASCULAR SYSTEM:  Tachycardic irregular.  No S3, S4, rubs, murmurs, or bruits.  No JVD.  No pedal edema. ABDOMEN:  Soft, nontender, and nondistended.  Bowel sounds are present. No masses or organomegaly is appreciated. GU:  Deferred. MUSCULOSKELETAL:  Normal muscle mass and tone.  She has bruising over both her knees, but she has good range of motion over her knees. Peripheral pulses are palpable. NEUROLOGIC:  She is alert and oriented x3.  She has generalized weakness, but no focal neurological deficits.  Gait was not assessed. SKIN:  Apart from the bruising, no other rashes are noted.  LABORATORY DATA:  Her white cell count is 18.2 with 89% neutrophils, hemoglobin is 16.2, MCV is 88, and platelet count is 316.  PT 16.4.  INR is 1.3.  Sodium is 126, potassium 3.6, chloride is 87, bicarb 25, glucose 96, BUN is 21, creatinine 0.66, AST is 69, ALT is 45, and procalcitonin 0.69.  Lactic acid 4.2, total CK 1393, troponin 0.12, BNP 17,956.  UA shows yellow urine, hazy in appearance, large blood, but only 0-2 RBCs consistent with rhabdomyolysis.  Many squamous epithelial cells were noted.  IMAGING STUDIES:  She had a CT of the head which showed stable atrophy and chronic microvascular ischemic changes.  No acute intracranial findings.  Right anterior frontal and left orbital swelling and bruising was noted.  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  This measures 11 mm.  No cerebellar abnormality demonstrated.  Chest x-ray showed no acute cardiopulmonary process.  EKG was done which shows sinus tachycardia at 140 with normal axis intervals that appear to be in the normal range.  No Q-waves.  There are some rate-related ST-segment changes with mild depression seen, but not significant enough.  No T-wave changes of concern are  noted.  This EKG was compared to EKG from June of which the rate was much lower at that time, and that EKG also did not show any acute findings.  ASSESSMENT:  This is a 70 year old Caucasian female with medical problems as stated earlier who has been noted to have frequent falls over the last few days and was brought in by her neighbors and friends because of this.  They also provided the history that she has been very weak at home, and has been falling, and they feel that she should not be staying by herself.  The patient admits to feeling weak.  She was also short of breath.  She has acute chronic obstructive pulmonary disease exacerbation.  This will be treated as outlined below.  She does have elevated BNP; however, clinically and based on  imaging studies, does not have pulmonary edema.  She has mildly elevated troponin which could be from the tachycardia and from chronic obstructive pulmonary disease exacerbation.  We will need to monitor this closely.  Etiology for her falls is not entirely clear and will require further workup.  PLAN: 1. Acute COPD exacerbation.  Because of her tachycardia and her     tenuous status, we will admit her to the step-down unit for     tonight.  She will be monitored closely.  Nebulizer treatments will     be given.  Steroids and antibiotics will be provided. 2. Elevated troponin.  Cardiac enzymes will be cycled.  At this point,     I will hold off on full anticoagulation.  If troponin continues to     rise, this should be considered.  Aspirin however will be given. 3. Elevated BNP.  Clinically she does not have fluid overload. 4. Frequent falls.  At this point, we will proceed with MRI of the     brain when she is a little bit more stable to see if there is any     other lesion in her brain.  We will check a B12 level.  We will     also have her seen by PT and OT. 5. Hyponatremia, possibly from her psychogenic polydipsia.  Urine     osmolality  urine sodium levels will be checked. 6. Mildly elevated transaminases.  We will recheck these levels     tomorrow morning. 7. Mild rhabdomyolysis.  Gentle IV hydration will be provided tonight.     CK levels to be followed. 8. Leukocytosis could be from acute stress.  She is afebrile, does not     have any other focus of infection, but we will cover her with     Avelox as outlined above. 9. Code status was discussed and she is okay with intubation if     needed.  So she will be a full code for now. 10.ABGs pending which we will be following up on.  Since she had an     echocardiogram recently, this will not be repeated unless     absolutely necessary. 11.DVT prophylaxis will be provided. 12.Further management decisions will depend on results of further     testing and the patient's response to treatment.  Osvaldo Shipper, MD     GK/MEDQ  D:  01/06/2011  T:  01/06/2011  Job:  [id number]  cc:   Pam Drown, M.D.  Electronically Signed by Osvaldo Shipper MD on 01/16/2011 04:54:09 PM

## 2011-01-17 NOTE — Discharge Summary (Signed)
[name], [name]                ACCOUNT NO.:  [id number]  MEDICAL RECORD NO.:  [id number]  LOCATION:  4713                         FACILITY:  [facility]  PHYSICIAN:  Isidor Holts, M.D.  DATE OF BIRTH:  1940/05/25  DATE OF ADMISSION:  01/06/2011 DATE OF DISCHARGE:  01/15/2011                        DISCHARGE SUMMARY - REFERRING   PRIMARY CARE PHYSICIAN:  Pam Drown, MD with [facility].  DISCHARGE DIAGNOSES: 1. Chronic hyponatremia. 2. Anxiety. 3. Multifocal pneumonia. 4. Infective exacerbation of chronic obstructive pulmonary disease,     secondary to above. 5. Escherichia coli urinary tract infection. 6. Newly diagnosed hypertension. 7. Protein-calorie malnutrition. 8. Deconditioning. 9. Fall, multifactorial.  DISCHARGE MEDICATIONS: 1. Alprazolam 0.25 mg p.o. p.r.n. q.8 hourly for anxiety. 2. Aspirin 325 mg p.o. daily. 3. Atrovent 1 treatment p.r.n. q.2 hourly for shortness of breath or     wheeze. 4. Levaquin 750 mg p.o. daily for 3 days only. 5. Lactulose free nutritional supplement (Boost energy drink) 237 mL     p.o. daily. 6. Metoprolol 50 mg p.o. b.i.d. 7. Senna 2 tablets p.o. p.r.n. daily for constipation. 8. Aleve 220 mg p.o. p.r.n. daily for pain. 9. Hydrocodone/APAP (5/500) 1 p.o. p.r.n. q.6 hourly for pain. 10.Lisinopril 40 mg p.o. b.i.d. 11.Multivitamin therapeutic 1 tablet p.o. daily. 12.Omega-3 OTC 1 capsule p.o. daily. 13.Thiamine 1 mg p.o. daily. 14.Vitamin D3 OTC 1 tablet p.o. daily. 15.Vitamin E OTC 1 capsule p.o. daily.  PROCEDURES: 1. Chest x-ray on January 06, 2011.  This showed no acute     cardiopulmonary findings. 2. Head CT scan on January 06, 2011.  This showed no evidence of     significant pulmonary embolus.  There was patchy airspace     opacification within the right lower lobe and to a lesser extent     within the left lower lobe compatible with mild multifocal     pneumonia.  There was mild bilateral upper lobe emphysema noted,    scattered coronary calcifications seen, mild soft tissue stranding     and fluid about the left kidney of uncertain significance.  Chronic     compression deformities of vertebral bodies T5, T6, and T10. 3. Head CT scan on January 06, 2011.  This showed stable atrophic and     chronic microvascular ischemic changes.  No acute intracranial     findings. 4. Chest x-ray on January 07, 2011.  This was a stable chest x-ray     with no evidence of acute cardiopulmonary process. 5. Brain MRI on January 07, 2011.  This showed no acute infarct or     mass.  There was left frontal scalp hematoma related to recent fall     with generalized atrophy and chronic microvascular ischemia. 6. Chest CT angiogram on January 10, 2011.  See #1 above.  ADMISSION HISTORY:  As in H and P notes of January 06, 2011, dictated by Dr. Osvaldo Shipper.  However, in brief, this is a 70 year old female who lives alone, presenting with recurrent falls, was found on the fall with complaints of extreme weakness, brought to the emergency department and was admitted for further evaluation, investigation, and management.  CLINICAL COURSE: 1. Hyponatremia.  The  patient presented with the hyponatremia of 126.     Review of electronic medical records, indicate that she has been     chronically hyponatremic and as a matter of fact, in November 2011,     sodium was 131 and by June 2012, was down to 127.  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  She was then     managed with intravenous infusion of normal saline, and we are     pleased to note that as of January 15, 2011, sodium was found more     reasonable at 128.  2. Multifocal pneumonia.  This was revealed on chest CT angiogram.     She was managed with an intravenous course of Levaquin, with     clinical improvement.  We were subsequently able to transition her     to oral  Levaquin.  A 10-day course of treatment is planned and as     of January 15, 2011, she was on day #7.  3. E. coli UTI.  This was managed with Levaquin.  The patient has     completed 7 days of this treatment.  4. Hypertension.  This is a new diagnosis and has been managed with     beta-blocker as well as ACE inhibitor, with satisfactory control.  5. Exacerbation of COPD.  This was related to the patient's multifocal     pneumonia and over the course of her hospitalization, has improved     with bronchodilator nebulizers.  6. Anxiety.  The patient is on Xanax for this, with adequate control.  7. Protein-calorie malnutrition.  The patient is obviously     malnourished.  She has been seen by the nutritionist and     appropriate recommendations made.  8. History of recurrent falls.  The patient has been evaluated by     PT/OT.  Fortunately, she did not sustain any bony injuries.  Likely,     this is multifactorial secondary to the patient's deconditioning,     malnutrition as well as recent acute medical problems.  She will     clearly need rehabilitation and has been recommended SNF placement,     given the fact that she lives alone.  9. Deconditioning.  As described above, the patient is considerably     deconditioned.  She has been evaluated by PT, OT, and SNF     recommended for rehab.  DISPOSITION:  The patient, as of January 15, 2011, was considered clinically stable for discharge.  There were no new issues.  She was therefore, discharged accordingly.  ACTIVITY:  As tolerated.  Recommended to increase activity slowly, otherwise per PT/OT/rehab.  DIET:  Regular.  FOLLOWUP INSTRUCTIONS:  The patient will follow up with SNF MD and also with her primary care physician, Dr. Corliss Blacker, per prior scheduled appointment.  DISCHARGE INSTRUCTIONS:  Continue PT/OT/rehab at the skilled nursing facility.     Isidor Holts, M.D.     CO/MEDQ  D:  01/15/2011  T:  01/15/2011   Job:  [id number]  cc:   Pam Drown, M.D.  Electronically Signed by Isidor Holts M.D. on 01/17/2011 06:17:58 PM

## 2011-01-20 NOTE — Progress Notes (Signed)
[name], [name]                ACCOUNT NO.:  [id number]  MEDICAL RECORD NO.:  [id number]  LOCATION:  4713                         FACILITY:  [facility]  PHYSICIAN:  Altha Harm, MDDATE OF BIRTH:  [birth year]                                PROGRESS NOTE   DATE OF DISCHARGE: Not yet determined.  CURRENT DISCHARGE DIAGNOSES: 1. Chronic hyponatremia, improving. 2. Sinus tachycardia, improved. 3. Anxiety, improved with Xanax. 4. Multifocal pneumonia on Levaquin. 5. Escherichia coli urinary tract infection, fully treated. 6. Hypertension, newly diagnosed. 7. Acute exacerbation of chronic obstructive pulmonary disease. 8. Protein calorie malnutrition. 9. Deconditioning. 10.Status post fall.  MEDICATIONS AT THE TIME OF THIS DICTATION: 1. Aspirin 325 mg p.o. daily. 2. Colace 100 mg p.o. b.i.d. 3. Lovenox 40 mg subcu 24 hours. 4. Atrovent nebulizer. 5. Levaquin 750 mg p.o. q.24 hours. 6. Lisinopril 40 mg p.o. daily. 7. Metoprolol 50 mg p.o. q.12 hours. 8. Multivitamin 1 tab p.o. daily. 9. Potassium 40 mEq p.o. daily. 10.Normal saline at 100 mL an hour. 11.Thiamine 100 mg p.o. daily. 12.Xanax 0.25 mg p.o. q.8 hours p.r.n. 13.Clonidine 0.1 mg p.o. q.6 hours p.r.n. 14.Atrovent nebulizer p.r.n.  CONSULTANTS: None.  PROCEDURES: None.  DIAGNOSTIC STUDIES: 1. Portable chest x-ray, which shows no acute cardiopulmonary     findings. 2. CT of the head without contrast, which shows stable atrophy and     chronic microvascular ischemic changes. Impression A.  No acute intracranial findings. B.  Right anterior frontal left orbital swelling and bruising. 1. Portable chest x-ray, which shows stable chest x-ray with no     evidence of acute pulmonary processes. 2. MR of the brain without contrast, which shows no acute infarct or     mass. Impression A.  Bifrontal scalp hematoma related to recent fall. B.  Generalized atrophy and chronic microvascular ischemia. 1. CT  angiogram of the chest which shows no evidence of significant     pulmonary embolus.  Patchy airspace opacification within the right     lower lobe extend within the left lower lobe compatible with mild     multifocal pneumonia. Impression A. Mild bilateral upper lobe emphysema. B.  Scattered coronary artery calcifications. C.  Mild soft tissue stranding and fluid about the left kidney, which is significant.  This is more prominent than on the prior CTA.  Suggest clinical correlation to exclude pyelonephritis. D.  Chronic compression deformities of the vertebral bodies T5-T6 and T10.  CHIEF COMPLAINT: Fall at home.  HISTORY OF PRESENT ILLNESS: Please refer to the H and P by Dr. Rito Ehrlich for details of the HPI. However, in short, this is a 70 year old lady who lives by herself and who has been independent at a community level.  She apparently has been having frequent falls and fell over the weekend.  They found her on the floor as she was complained of extreme weakness.  She was brought to the emergency room for further evaluation and management.  Here in the emergency room, the patient was noted to have hyponatremia and [facility] hospitalists were asked to further admit her for evaluation and treatment.  HOSPITAL COURSE: 1. Hyponatremia.  It appears that the patient's hyponatremia is  chronic.  Initially, it was felt that it might have been secondary     to SIADH.  The patient's fluid was restricted and she was given     Lasix, however, instead of improve in her sodium it actually went     down and she became what appeared to be volume contracted.  Thus     the fluid restriction has been removed and the patient is being     given IV fluids.  The patient's sodium went down to 124, however,     it is back up to 128 right now.  The patient also had been on     steroids which likely could have been contributing to the picture.     The steroids have been discontinued.  I think it is  prudent at this     time to take the patient off of the IV fluids and allow her to eat     and drink and see whether or not her she is able to maintain her     sodiums.  It certainly does appear that the patient's condition is     chronic, however, I believe that her chronic condition is in fact     effecting her strength. 2. Acute exacerbation of COPD and multifocal pneumonia.  The patient     presented with a multifocal pneumonia.  She was started on     vancomycin and Levaquin.  The vancomycin was discontinued and the     patient is continuing on Levaquin.  She has completed a total of     6/10 days of Levaquin and she should continue for additional 4 days     to complete treatment. 3. Urinary tract infection.  The patient is found to have a urinary     tract infection pathogen identified as E. coli.  The E. coli     sensitive to Levaquin and the patient is on Levaquin for a total of     10 days for pneumonia.  The CT of the scan showed stranding which     might have been consistent with pyelonephritis, thus the 10 days of     antibiotics will certainly cover pyelonephritis on this patient. 4. Weakness, the patient is generally weak.  She has also has a     moderate protein calorie malnutrition.  She was seen by Nutritional     Services and by Physical Therapy who recommended the patient should     be in a skilled facility for ongoing rehab.  The patient has been     accepted by [facility] and should be able to transfer to [facility] within the next 24-48 hours as long as her sodium has     continued to come up. 5. Leukocytosis, the patient demonstrated a leukocytosis which I feel     is mostly secondary to the prednisone.  The patient has completed     her prednisone.  This has been discontinued and the white blood     cell count has already started to trend down.  The patient has had     no focal features of new or ongoing infection of any fulminants     that will cause a  rise in her white blood cell count.  Thus, I     attribute this all to the steroids. 6. Sinus tachycardia.  The patient had a sinus tachycardia as well as     hypertension.  It is unclear from the patient's account as to     whether or not she had any prior history of hypertension and     tachycardia.  However, the patient was ruled out for PE with CT     angiogram, her TSH was also evaluated and found to be within normal     limits.  The patient was started on a beta-blocker and her heart     rate is now in the 80s.  It is also was thought that the patient     had components of anxiety contributing to this and thus she was     started on Xanax which is improved.  This essentially summarizes the patient's hospital course at this point.     Altha Harm, MD     MAM/MEDQ  D:  01/14/2011  T:  01/14/2011  Job:  [id number]  Electronically Signed by Marthann Schiller MD on 01/20/2011 01:26:38 PM

## 2011-11-26 ENCOUNTER — Other Ambulatory Visit: Payer: Self-pay | Admitting: Family Medicine

## 2011-11-26 DIAGNOSIS — S22000A Wedge compression fracture of unspecified thoracic vertebra, initial encounter for closed fracture: Secondary | ICD-10-CM

## 2011-11-30 ENCOUNTER — Ambulatory Visit
Admission: RE | Admit: 2011-11-30 | Discharge: 2011-11-30 | Disposition: A | Discharge status: Home / Self Care | Payer: Medicare Other | Source: Ambulatory Visit | Attending: Family Medicine | Admitting: Family Medicine

## 2011-11-30 DIAGNOSIS — S22000A Wedge compression fracture of unspecified thoracic vertebra, initial encounter for closed fracture: Secondary | ICD-10-CM

## 2012-06-07 ENCOUNTER — Emergency Department (HOSPITAL_COMMUNITY): Payer: Medicare Other

## 2012-06-07 ENCOUNTER — Emergency Department (HOSPITAL_COMMUNITY)
Admission: EM | Admit: 2012-06-07 | Discharge: 2012-06-07 | Disposition: A | Discharge status: Home / Self Care | Payer: Medicare Other | Attending: Emergency Medicine | Admitting: Emergency Medicine

## 2012-06-07 ENCOUNTER — Encounter (HOSPITAL_COMMUNITY): Payer: Self-pay

## 2012-06-07 DIAGNOSIS — S22000S Wedge compression fracture of unspecified thoracic vertebra, sequela: Secondary | ICD-10-CM

## 2012-06-07 DIAGNOSIS — R0989 Other specified symptoms and signs involving the circulatory and respiratory systems: Secondary | ICD-10-CM | POA: Insufficient documentation

## 2012-06-07 DIAGNOSIS — J449 Chronic obstructive pulmonary disease, unspecified: Secondary | ICD-10-CM | POA: Insufficient documentation

## 2012-06-07 DIAGNOSIS — I10 Essential (primary) hypertension: Secondary | ICD-10-CM | POA: Insufficient documentation

## 2012-06-07 DIAGNOSIS — Z79899 Other long term (current) drug therapy: Secondary | ICD-10-CM | POA: Insufficient documentation

## 2012-06-07 DIAGNOSIS — Z8639 Personal history of other endocrine, nutritional and metabolic disease: Secondary | ICD-10-CM | POA: Insufficient documentation

## 2012-06-07 DIAGNOSIS — R0609 Other forms of dyspnea: Secondary | ICD-10-CM | POA: Insufficient documentation

## 2012-06-07 DIAGNOSIS — Z8781 Personal history of (healed) traumatic fracture: Secondary | ICD-10-CM | POA: Insufficient documentation

## 2012-06-07 DIAGNOSIS — F17200 Nicotine dependence, unspecified, uncomplicated: Secondary | ICD-10-CM | POA: Insufficient documentation

## 2012-06-07 DIAGNOSIS — R05 Cough: Secondary | ICD-10-CM | POA: Insufficient documentation

## 2012-06-07 DIAGNOSIS — S22009A Unspecified fracture of unspecified thoracic vertebra, initial encounter for closed fracture: Secondary | ICD-10-CM | POA: Insufficient documentation

## 2012-06-07 DIAGNOSIS — Z8659 Personal history of other mental and behavioral disorders: Secondary | ICD-10-CM | POA: Insufficient documentation

## 2012-06-07 DIAGNOSIS — R0600 Dyspnea, unspecified: Secondary | ICD-10-CM

## 2012-06-07 DIAGNOSIS — IMO0002 Reserved for concepts with insufficient information to code with codable children: Secondary | ICD-10-CM | POA: Insufficient documentation

## 2012-06-07 DIAGNOSIS — Y939 Activity, unspecified: Secondary | ICD-10-CM | POA: Insufficient documentation

## 2012-06-07 DIAGNOSIS — Z862 Personal history of diseases of the blood and blood-forming organs and certain disorders involving the immune mechanism: Secondary | ICD-10-CM | POA: Insufficient documentation

## 2012-06-07 DIAGNOSIS — R000 Tachycardia, unspecified: Secondary | ICD-10-CM | POA: Insufficient documentation

## 2012-06-07 DIAGNOSIS — W19XXXA Unspecified fall, initial encounter: Secondary | ICD-10-CM | POA: Insufficient documentation

## 2012-06-07 DIAGNOSIS — R059 Cough, unspecified: Secondary | ICD-10-CM | POA: Insufficient documentation

## 2012-06-07 DIAGNOSIS — J4489 Other specified chronic obstructive pulmonary disease: Secondary | ICD-10-CM | POA: Insufficient documentation

## 2012-06-07 DIAGNOSIS — Y929 Unspecified place or not applicable: Secondary | ICD-10-CM | POA: Insufficient documentation

## 2012-06-07 HISTORY — DX: Reserved for concepts with insufficient information to code with codable children: IMO0002

## 2012-06-07 HISTORY — DX: Hyperlipidemia, unspecified: E78.5

## 2012-06-07 LAB — CBC WITH DIFFERENTIAL/PLATELET
Basophils Absolute: 0 10*3/uL (ref 0.0–0.1)
Eosinophils Relative: 0 % (ref 0–5)
Lymphocytes Relative: 20 % (ref 12–46)
Lymphs Abs: 2.3 10*3/uL (ref 0.7–4.0)
Neutro Abs: 8.1 10*3/uL — ABNORMAL HIGH (ref 1.7–7.7)
Neutrophils Relative %: 70 % (ref 43–77)
Platelets: 334 10*3/uL (ref 150–400)
RBC: 5.17 MIL/uL — ABNORMAL HIGH (ref 3.87–5.11)
RDW: 13.2 % (ref 11.5–15.5)
WBC: 11.5 10*3/uL — ABNORMAL HIGH (ref 4.0–10.5)

## 2012-06-07 LAB — BASIC METABOLIC PANEL
CO2: 25 mEq/L (ref 19–32)
Calcium: 9.9 mg/dL (ref 8.4–10.5)
Chloride: 92 mEq/L — ABNORMAL LOW (ref 96–112)
Glucose, Bld: 89 mg/dL (ref 70–99)
Potassium: 4.1 mEq/L (ref 3.5–5.1)
Sodium: 130 mEq/L — ABNORMAL LOW (ref 135–145)

## 2012-06-07 LAB — D-DIMER, QUANTITATIVE: D-Dimer, Quant: 0.84 ug/mL-FEU — ABNORMAL HIGH (ref 0.00–0.48)

## 2012-06-07 LAB — URINALYSIS, ROUTINE W REFLEX MICROSCOPIC
Glucose, UA: NEGATIVE mg/dL
Protein, ur: NEGATIVE mg/dL
Specific Gravity, Urine: 1.026 (ref 1.005–1.030)

## 2012-06-07 LAB — TROPONIN I: Troponin I: <0.3 ng/mL (ref <0.30)

## 2012-06-07 LAB — URINE MICROSCOPIC-ADD ON

## 2012-06-07 MED ORDER — SODIUM CHLORIDE 0.9 % IV SOLN: INTRAVENOUS | Status: DC

## 2012-06-07 MED ORDER — SODIUM CHLORIDE 0.9 % IV BOLUS (SEPSIS)
500.0000 mL | Freq: Once | INTRAVENOUS | Status: AC
  Administered 2012-06-07: 500 mL via INTRAVENOUS

## 2012-06-07 MED ORDER — IOHEXOL 350 MG/ML SOLN
100.0000 mL | Freq: Once | INTRAVENOUS | Status: AC | PRN
  Administered 2012-06-07: 100 mL via INTRAVENOUS

## 2012-06-07 NOTE — ED Notes (Signed)
Patient c/o SOB that started this AM. Patient has a productive cough with small amount of clear sputum.Patient denies CP. Patient saw her PCP this AM and was directed to come to the ED for lab testing.

## 2012-06-07 NOTE — ED Notes (Signed)
Pt expressing desire to leave. MD made aware.

## 2012-06-07 NOTE — ED Notes (Signed)
Patient transported to CT 

## 2012-06-07 NOTE — ED Provider Notes (Addendum)
History     CSN: [id number]  Arrival date & time 06/07/12  1454   First MD Initiated Contact with Patient 06/07/12 1714      Chief Complaint  Patient presents with  . Shortness of Breath    (Consider location/radiation/quality/duration/timing/severity/associated sxs/prior treatment) HPI Comments: [name] is a 72 y.o. Female who is here for evaluation of cough and tachycardia. She went to see her doctor today for evaluation of back pain from a fall, shortness of breath and cough. Symptoms have been present for 3 days. There are no known aggravating or so. At the doctor's office. She was found to be tachycardic. She was sent here for further evaluation. Chest x-ray was done there, but it was not sent with the patient. It is not available to review in EPIC. She is taking her usual medications. But no other known modifying factors.  Patient is a 72 y.o. female presenting with shortness of breath. The history is provided by the patient and a relative.  Shortness of Breath   Past Medical History  Diagnosis Date  . Back pain   . Hypertension   . COPD (chronic obstructive pulmonary disease)   . Anxiety   . Hyperlipemia   . Compression fracture     Past Surgical History  Procedure Laterality Date  . Back surgery      History reviewed. No pertinent family history.  History  Substance Use Topics  . Smoking status: Current Some Day Smoker  . Smokeless tobacco: Never Used  . Alcohol Use: Yes     Comment: wine socially    OB History   Grav Para Term Preterm Abortions TAB SAB Ect Mult Living                  Review of Systems  Respiratory: Positive for shortness of breath.   All other systems reviewed and are negative.    Allergies  Review of patient's allergies indicates no known allergies.  Home Medications   Current Outpatient Rx  Name  Route  Sig  Dispense  Refill  . cholecalciferol (VITAMIN D) 1000 UNITS tablet   Oral   Take 1,000 Units by mouth  daily.         Marland Kitchen lisinopril (PRINIVIL,ZESTRIL) 40 MG tablet   Oral   Take 40 mg by mouth daily.         . Multiple Vitamins-Minerals (MULTIVITAMIN WITH MINERALS) tablet   Oral   Take 1 tablet by mouth daily.           . traMADol (ULTRAM) 50 MG tablet   Oral   Take 50 mg by mouth every 6 (six) hours as needed (pain).           BP 165/99  Pulse 123  Temp(Src) 97.7 F (36.5 C) (Oral)  Resp 24  SpO2 97%  Physical Exam  Nursing note and vitals reviewed. Constitutional: She is oriented to person, place, and time. She appears well-developed.  Frail,  HENT:  Head: Normocephalic and atraumatic.  Eyes: Conjunctivae and EOM are normal. Pupils are equal, round, and reactive to light.  Neck: Normal range of motion and phonation normal. Neck supple.  Cardiovascular: Normal rate and intact distal pulses.   Tachycardic  Pulmonary/Chest: Effort normal and breath sounds normal. She has no wheezes. She has no rales. She exhibits no tenderness.  Abdominal: Soft. She exhibits no distension. There is no tenderness. There is no guarding.  Musculoskeletal: Normal range of motion.  No cervical, thoracic,  lumbar spinal tenderness or deformity  Neurological: She is alert and oriented to person, place, and time. She has normal strength. She exhibits normal muscle tone.  Skin: Skin is warm and dry.  Psychiatric: She has a normal mood and affect. Her behavior is normal. Judgment and thought content normal.    ED Course  Procedures (including critical care time)      Date: 01/09/2012  Rate: 114  Rhythm: sinus tachycardia  QRS Axis: normal  PR and QT Intervals: normal  ST/T Wave abnormalities: normal  PR and QRS Conduction Disutrbances:none  Narrative Interpretation:   Old EKG Reviewed: unchanged   Labs Reviewed  CBC WITH DIFFERENTIAL - Abnormal; Notable for the following:    WBC 11.5 (*)    RBC 5.17 (*)    Hemoglobin 17.0 (*)    HCT 48.9 (*)    Neutro Abs 8.1 (*)     Monocytes Absolute 1.1 (*)    All other components within normal limits  BASIC METABOLIC PANEL - Abnormal; Notable for the following:    Sodium 130 (*)    Chloride 92 (*)    GFR calc non Af Amer 85 (*)    All other components within normal limits  URINALYSIS, ROUTINE W REFLEX MICROSCOPIC - Abnormal; Notable for the following:    Color, Urine AMBER (*)    APPearance CLOUDY (*)    Bilirubin Urine SMALL (*)    Ketones, ur 15 (*)    Leukocytes, UA TRACE (*)    All other components within normal limits  D-DIMER, QUANTITATIVE - Abnormal; Notable for the following:    D-Dimer, Quant 0.84 (*)    All other components within normal limits  URINE MICROSCOPIC-ADD ON - Abnormal; Notable for the following:    Squamous Epithelial / LPF FEW (*)    Bacteria, UA FEW (*)    All other components within normal limits  TROPONIN I   Dg Chest 2 View  06/07/2012  *RADIOLOGY REPORT*  Clinical Data: Short of breath  CHEST - 2 VIEW  Comparison: 06/07/2012  Findings: Cardiac enlargement without heart failure.  Negative for infiltrate or effusion.  Mild left lower lobe atelectasis or scarring is unchanged.  Chronic thoracic fracture with cement vertebroplasty approximately T6.  There is a fracture of T4 which is not present on the study of  11/19/2011 or 11/30/2011.  This is unchanged from earlier today and may represent a recent fracture. Correlate with pain.  There is a moderate fracture T10.  This is unchanged from 11/19/2011.  IMPRESSION: No acute cardiopulmonary abnormality.  Chronic fractures T6 and T10.  Mild fracture T4 which could be acute.   Original Report Authenticated By: Janeece Riggers, M.D.    Ct Angio Chest Pe W/cm &/or Wo Cm  06/07/2012  *RADIOLOGY REPORT*  Clinical Data: Shortness of breath beginning this morning. Productive cough.  CT ANGIOGRAPHY CHEST  Technique:  Multidetector CT imaging of the chest using the standard protocol during bolus administration of intravenous contrast. Multiplanar  reconstructed images including MIPs were obtained and reviewed to evaluate the vascular anatomy.  Contrast: OMNIPAQUE IOHEXOL 350 MG/ML SOLN  Comparison: 01/10/2011  Findings: Technically adequate study with good opacification of the central and segmental pulmonary arteries.  No focal filling defects.  No evidence of significant pulmonary embolus.  Normal heart size.  Normal caliber thoracic aorta with calcification.  Esophagus is decompressed.  No significant lymphadenopathy in the chest.  Small amount of fluid in the aortopulmonic window likely represents a pericardial recess  versus small cystic structure.  This is stable since previous study. Visualized portions of the upper abdominal organs are unremarkable. No pleural effusions.  Scattered fibrosis in the lungs.  No focal consolidation or airspace disease.  No significant interstitial changes.  Airways appear patent.  No pneumothorax.  There is diffuse bone demineralization.  Multiple thoracic vertebral compression fractures with previous kyphoplasty, probably at T6. There appears to be an old healed fracture of the sternum.  Bone changes appear stable since previous study.  IMPRESSION: No evidence of significant pulmonary embolus.  Diffuse bone demineralization with multiple vertebral compression fractures and old fracture of the sternum, likely representing osteoporosis.  No evidence of active pulmonary disease.   Original Report Authenticated By: Burman Nieves, M.D.    Nursing Notes Reviewed/ Care Coordinated, and agree without changes. Applicable Imaging Reviewed. Radiologic imaging report reviewed and images by CT  - viewed, by me. Interpretation of Laboratory Data incorporated into ED treatment  1. Tachycardia   2. Thoracic compression fracture, sequela   3. Dyspnea       MDM  Nonspecific tachycardia, with associated anxiety. She's had a comprehensive evaluations without findings for cardiac, pulmonary, metabolic or infectious  etiologies. She is incidental findings of thoracic compression fractures; and has had a recent fall. Doubt spinal myelopathy. She is improved with treatment stable for discharge        Flint Melter, MD 06/07/12 [id number]  Flint Melter, MD 06/08/12 [phone]

## 2012-06-07 NOTE — ED Notes (Signed)
MD made aware.

## 2012-06-07 NOTE — ED Notes (Signed)
Pt stated that she is ready to leave and does not want to wait any longer as she is feeling anxious.  Notified Dr. Effie Shy and Sedalia Muta, Charge RN; Dr. Effie Shy agreed with [facility]. Pt denies shortness of breath at this time.  Pt's caregiver is at bedside with her.

## 2012-08-17 ENCOUNTER — Emergency Department (HOSPITAL_COMMUNITY): Payer: Medicare Other

## 2012-08-17 ENCOUNTER — Encounter (HOSPITAL_COMMUNITY): Payer: Self-pay | Admitting: Emergency Medicine

## 2012-08-17 ENCOUNTER — Inpatient Hospital Stay (HOSPITAL_COMMUNITY)
Admission: EM | Admit: 2012-08-17 | Discharge: 2012-08-23 | DRG: 189 | Disposition: A | Discharge status: Skilled Nursing Facility | Payer: Medicare Other | Attending: Internal Medicine | Admitting: Internal Medicine

## 2012-08-17 DIAGNOSIS — B0052 Herpesviral keratitis: Secondary | ICD-10-CM

## 2012-08-17 DIAGNOSIS — N390 Urinary tract infection, site not specified: Secondary | ICD-10-CM | POA: Diagnosis present

## 2012-08-17 DIAGNOSIS — E785 Hyperlipidemia, unspecified: Secondary | ICD-10-CM | POA: Diagnosis present

## 2012-08-17 DIAGNOSIS — D72829 Elevated white blood cell count, unspecified: Secondary | ICD-10-CM | POA: Diagnosis present

## 2012-08-17 DIAGNOSIS — F411 Generalized anxiety disorder: Secondary | ICD-10-CM | POA: Diagnosis present

## 2012-08-17 DIAGNOSIS — J9610 Chronic respiratory failure, unspecified whether with hypoxia or hypercapnia: Secondary | ICD-10-CM

## 2012-08-17 DIAGNOSIS — Z79899 Other long term (current) drug therapy: Secondary | ICD-10-CM

## 2012-08-17 DIAGNOSIS — R0902 Hypoxemia: Secondary | ICD-10-CM | POA: Diagnosis present

## 2012-08-17 DIAGNOSIS — I509 Heart failure, unspecified: Secondary | ICD-10-CM

## 2012-08-17 DIAGNOSIS — M549 Dorsalgia, unspecified: Secondary | ICD-10-CM

## 2012-08-17 DIAGNOSIS — G8929 Other chronic pain: Secondary | ICD-10-CM | POA: Diagnosis present

## 2012-08-17 DIAGNOSIS — J449 Chronic obstructive pulmonary disease, unspecified: Secondary | ICD-10-CM | POA: Diagnosis present

## 2012-08-17 DIAGNOSIS — J9611 Chronic respiratory failure with hypoxia: Secondary | ICD-10-CM | POA: Diagnosis present

## 2012-08-17 DIAGNOSIS — J4489 Other specified chronic obstructive pulmonary disease: Secondary | ICD-10-CM

## 2012-08-17 DIAGNOSIS — R631 Polydipsia: Secondary | ICD-10-CM | POA: Diagnosis present

## 2012-08-17 DIAGNOSIS — J9620 Acute and chronic respiratory failure, unspecified whether with hypoxia or hypercapnia: Principal | ICD-10-CM | POA: Diagnosis present

## 2012-08-17 DIAGNOSIS — I5032 Chronic diastolic (congestive) heart failure: Secondary | ICD-10-CM | POA: Diagnosis present

## 2012-08-17 DIAGNOSIS — F17200 Nicotine dependence, unspecified, uncomplicated: Secondary | ICD-10-CM | POA: Diagnosis present

## 2012-08-17 DIAGNOSIS — T17508A Unspecified foreign body in bronchus causing other injury, initial encounter: Secondary | ICD-10-CM | POA: Diagnosis present

## 2012-08-17 DIAGNOSIS — IMO0002 Reserved for concepts with insufficient information to code with codable children: Secondary | ICD-10-CM | POA: Diagnosis present

## 2012-08-17 DIAGNOSIS — I10 Essential (primary) hypertension: Secondary | ICD-10-CM

## 2012-08-17 DIAGNOSIS — E871 Hypo-osmolality and hyponatremia: Secondary | ICD-10-CM

## 2012-08-17 DIAGNOSIS — B029 Zoster without complications: Secondary | ICD-10-CM

## 2012-08-17 LAB — URINE MICROSCOPIC-ADD ON

## 2012-08-17 LAB — CBC WITH DIFFERENTIAL/PLATELET
Basophils Relative: 0 % (ref 0–1)
Eosinophils Absolute: 0 10*3/uL (ref 0.0–0.7)
HCT: 51.6 % — ABNORMAL HIGH (ref 36.0–46.0)
Hemoglobin: 18.3 g/dL — ABNORMAL HIGH (ref 12.0–15.0)
Lymphs Abs: 1.1 10*3/uL (ref 0.7–4.0)
MCH: 32.7 pg (ref 26.0–34.0)
MCHC: 35.5 g/dL (ref 30.0–36.0)
MCV: 92.3 fL (ref 78.0–100.0)
Monocytes Absolute: 1 10*3/uL (ref 0.1–1.0)
Monocytes Relative: 9 % (ref 3–12)

## 2012-08-17 LAB — BASIC METABOLIC PANEL
BUN: 11 mg/dL (ref 6–23)
Creatinine, Ser: 0.58 mg/dL (ref 0.50–1.10)
GFR calc Af Amer: >90 mL/min (ref >90)
GFR calc non Af Amer: 89 mL/min — ABNORMAL LOW (ref >90)
Glucose, Bld: 98 mg/dL (ref 70–99)

## 2012-08-17 LAB — URINALYSIS, ROUTINE W REFLEX MICROSCOPIC
Glucose, UA: NEGATIVE mg/dL
Hgb urine dipstick: NEGATIVE
Ketones, ur: 40 mg/dL — AB
Leukocytes, UA: NEGATIVE
Protein, ur: 100 mg/dL — AB
pH: 6 (ref 5.0–8.0)

## 2012-08-17 LAB — BLOOD GAS, ARTERIAL
Acid-Base Excess: 4.8 mmol/L — ABNORMAL HIGH (ref 0.0–2.0)
Drawn by: 295031
FIO2: 0.21 %
O2 Saturation: 91.8 %
pO2, Arterial: 56.1 mmHg — ABNORMAL LOW (ref 80.0–100.0)

## 2012-08-17 MED ORDER — HEPARIN SODIUM (PORCINE) 5000 UNIT/ML IJ SOLN
5000.0000 [IU] | Freq: Three times a day (TID) | INTRAMUSCULAR | Status: DC
Start: 2012-08-17 — End: 2012-08-23
  Administered 2012-08-17 – 2012-08-23 (×18): 5000 [IU] via SUBCUTANEOUS
  Filled 2012-08-17 (×20): qty 1

## 2012-08-17 MED ORDER — SODIUM CHLORIDE 0.9 % IJ SOLN: 3.0000 mL | INTRAMUSCULAR | Status: DC | PRN

## 2012-08-17 MED ORDER — ONDANSETRON HCL 4 MG/2ML IJ SOLN: 4.0000 mg | Freq: Four times a day (QID) | INTRAMUSCULAR | Status: DC | PRN

## 2012-08-17 MED ORDER — ALBUTEROL SULFATE (5 MG/ML) 0.5% IN NEBU
2.5000 mg | INHALATION_SOLUTION | RESPIRATORY_TRACT | Status: DC | PRN
  Filled 2012-08-17 (×3): qty 0.5

## 2012-08-17 MED ORDER — METHYLPREDNISOLONE SODIUM SUCC 40 MG IJ SOLR
40.0000 mg | Freq: Three times a day (TID) | INTRAMUSCULAR | Status: DC
  Administered 2012-08-18 – 2012-08-19 (×4): 40 mg via INTRAVENOUS
  Filled 2012-08-17 (×7): qty 1

## 2012-08-17 MED ORDER — LISINOPRIL 20 MG PO TABS
40.0000 mg | ORAL_TABLET | Freq: Every day | ORAL | Status: DC
  Administered 2012-08-17: 40 mg via ORAL
  Filled 2012-08-17 (×2): qty 2

## 2012-08-17 MED ORDER — SODIUM CHLORIDE 0.9 % IJ SOLN
3.0000 mL | Freq: Two times a day (BID) | INTRAMUSCULAR | Status: DC
  Administered 2012-08-17 – 2012-08-19 (×4): 3 mL via INTRAVENOUS

## 2012-08-17 MED ORDER — ACETAMINOPHEN 325 MG PO TABS: 650.0000 mg | ORAL_TABLET | Freq: Four times a day (QID) | ORAL | Status: DC | PRN

## 2012-08-17 MED ORDER — METHYLPREDNISOLONE SODIUM SUCC 125 MG IJ SOLR
125.0000 mg | Freq: Once | INTRAMUSCULAR | Status: AC
  Administered 2012-08-17: 125 mg via INTRAVENOUS
  Filled 2012-08-17: qty 2

## 2012-08-17 MED ORDER — LEVOFLOXACIN IN D5W 750 MG/150ML IV SOLN
750.0000 mg | Freq: Once | INTRAVENOUS | Status: AC
  Administered 2012-08-17: 750 mg via INTRAVENOUS
  Filled 2012-08-17: qty 150

## 2012-08-17 MED ORDER — TRAMADOL HCL 50 MG PO TABS
50.0000 mg | ORAL_TABLET | Freq: Four times a day (QID) | ORAL | Status: DC | PRN
  Administered 2012-08-17 – 2012-08-20 (×2): 50 mg via ORAL
  Filled 2012-08-17 (×2): qty 1

## 2012-08-17 MED ORDER — SODIUM CHLORIDE 0.9 % IV SOLN: 250.0000 mL | INTRAVENOUS | Status: DC | PRN

## 2012-08-17 MED ORDER — ONDANSETRON HCL 4 MG PO TABS: 4.0000 mg | ORAL_TABLET | Freq: Four times a day (QID) | ORAL | Status: DC | PRN

## 2012-08-17 MED ORDER — ADULT MULTIVITAMIN W/MINERALS CH
1.0000 | ORAL_TABLET | Freq: Every day | ORAL | Status: DC
  Administered 2012-08-17 – 2012-08-23 (×7): 1 via ORAL
  Filled 2012-08-17 (×7): qty 1

## 2012-08-17 MED ORDER — TIOTROPIUM BROMIDE MONOHYDRATE 18 MCG IN CAPS
18.0000 ug | ORAL_CAPSULE | Freq: Every day | RESPIRATORY_TRACT | Status: DC
  Administered 2012-08-18: 18 ug via RESPIRATORY_TRACT
  Filled 2012-08-17: qty 5

## 2012-08-17 MED ORDER — LEVOFLOXACIN IN D5W 750 MG/150ML IV SOLN: 750.0000 mg | INTRAVENOUS | Status: DC

## 2012-08-17 MED ORDER — GUAIFENESIN ER 600 MG PO TB12
1200.0000 mg | ORAL_TABLET | Freq: Two times a day (BID) | ORAL | Status: DC
  Administered 2012-08-17 – 2012-08-23 (×12): 1200 mg via ORAL
  Filled 2012-08-17 (×13): qty 2

## 2012-08-17 MED ORDER — ACETAMINOPHEN 650 MG RE SUPP: 650.0000 mg | Freq: Four times a day (QID) | RECTAL | Status: DC | PRN

## 2012-08-17 MED ORDER — IOHEXOL 300 MG/ML  SOLN: 100.0000 mL | Freq: Once | INTRAMUSCULAR | Status: DC | PRN

## 2012-08-17 MED ORDER — IOHEXOL 350 MG/ML SOLN
100.0000 mL | Freq: Once | INTRAVENOUS | Status: AC | PRN
  Administered 2012-08-17: 100 mL via INTRAVENOUS

## 2012-08-17 MED ORDER — LEVALBUTEROL HCL 1.25 MG/0.5ML IN NEBU
1.2500 mg | INHALATION_SOLUTION | Freq: Once | RESPIRATORY_TRACT | Status: AC
  Administered 2012-08-17: 1.25 mg via RESPIRATORY_TRACT
  Filled 2012-08-17: qty 0.5

## 2012-08-17 MED ORDER — ALBUTEROL SULFATE (5 MG/ML) 0.5% IN NEBU
2.5000 mg | INHALATION_SOLUTION | Freq: Four times a day (QID) | RESPIRATORY_TRACT | Status: DC
Start: 2012-08-17 — End: 2012-08-18
  Administered 2012-08-17 – 2012-08-18 (×3): 2.5 mg via RESPIRATORY_TRACT
  Filled 2012-08-17 (×3): qty 0.5

## 2012-08-17 NOTE — ED Notes (Signed)
[id number]<AV> Expected date:<BR> Expected time:<BR> Means of arrival:<BR> Comments:<BR> 72 y/o F resp distress

## 2012-08-17 NOTE — ED Provider Notes (Addendum)
History     CSN: [id number]  Arrival date & time 08/17/12  0915   First MD Initiated Contact with Patient 08/17/12 [phone]      Chief Complaint  Patient presents with  . Shortness of Breath  . Urinary Tract Infection    seen last wednesday for UTI  . Fall    seen last wednesday for fall and refused xray  . Back Pain    since fall last wednesday    (Consider location/radiation/quality/duration/timing/severity/associated sxs/prior treatment) HPI Comments: Patient comes to the ER for evaluation of shortness of breath. Patient reports that she does have trouble breathing from time to time, but today it was much worse. Patient reports that her breathing seems to be worse when she lies down on her back. She has not noticed any leg swelling. There is no chest pain. Patient denies cough and fever.  Patient experiencing generalized weakness. She was recently diagnosed with urinary tract infection. No nausea, vomiting or abdominal pain.  Patient is complaining of mid back pain. She reports that she fell a week ago and has been having back pain ever since. She does have a history of compression fractures in the back.  Patient is a 72 y.o. female presenting with shortness of breath, urinary tract infection, fall, and back pain.  Shortness of Breath Associated symptoms: no abdominal pain, no chest pain and no fever   Urinary Tract Infection Associated symptoms include shortness of breath. Pertinent negatives include no chest pain and no abdominal pain.  Fall Associated symptoms include shortness of breath. Pertinent negatives include no chest pain and no abdominal pain.  Back Pain Associated symptoms: no abdominal pain, no chest pain and no fever     Past Medical History  Diagnosis Date  . Back pain   . Hypertension   . COPD (chronic obstructive pulmonary disease)   . Anxiety   . Hyperlipemia   . Compression fracture     Past Surgical History  Procedure Laterality Date  . Back  surgery      History reviewed. No pertinent family history.  History  Substance Use Topics  . Smoking status: Current Some Day Smoker  . Smokeless tobacco: Never Used  . Alcohol Use: Yes     Comment: wine socially    OB History   Grav Para Term Preterm Abortions TAB SAB Ect Mult Living                  Review of Systems  Constitutional: Negative for fever.  Respiratory: Positive for shortness of breath.   Cardiovascular: Negative for chest pain.  Gastrointestinal: Negative for abdominal pain.  Musculoskeletal: Positive for back pain.  All other systems reviewed and are negative.    Allergies  Review of patient's allergies indicates no known allergies.  Home Medications   Current Outpatient Rx  Name  Route  Sig  Dispense  Refill  . cholecalciferol (VITAMIN D) 1000 UNITS tablet   Oral   Take 1,000 Units by mouth daily.         Marland Kitchen lisinopril (PRINIVIL,ZESTRIL) 40 MG tablet   Oral   Take 40 mg by mouth daily.         . Multiple Vitamins-Minerals (MULTIVITAMIN WITH MINERALS) tablet   Oral   Take 1 tablet by mouth daily.           . traMADol (ULTRAM) 50 MG tablet   Oral   Take 50 mg by mouth every 6 (six) hours as needed (pain).  BP 168/108  Pulse 129  Temp(Src) 97.4 F (36.3 C) (Oral)  Resp 20  SpO2 91%  Physical Exam  Constitutional: She is oriented to person, place, and time. She appears well-developed and well-nourished. No distress.  HENT:  Head: Normocephalic and atraumatic.  Right Ear: Hearing normal.  Left Ear: Hearing normal.  Nose: Nose normal.  Mouth/Throat: Oropharynx is clear and moist and mucous membranes are normal.  Eyes: Conjunctivae and EOM are normal. Pupils are equal, round, and reactive to light.  Neck: Normal range of motion. Neck supple.  Cardiovascular: Regular rhythm, S1 normal and S2 normal.  Exam reveals no gallop and no friction rub.   No murmur heard. Pulmonary/Chest: Tachypnea noted. No respiratory  distress. She has no wheezes. She has no rhonchi. She has rales in the left lower field. She exhibits no tenderness.  Abdominal: Soft. Normal appearance and bowel sounds are normal. There is no hepatosplenomegaly. There is no tenderness. There is no rebound, no guarding, no tenderness at McBurney's point and negative Murphy's sign. No hernia.  Musculoskeletal: Normal range of motion.       Thoracic back: She exhibits tenderness.  Neurological: She is alert and oriented to person, place, and time. She has normal strength. No cranial nerve deficit or sensory deficit. Coordination normal. GCS eye subscore is 4. GCS verbal subscore is 5. GCS motor subscore is 6.  Skin: Skin is warm, dry and intact. No rash noted. No cyanosis.  Psychiatric: She has a normal mood and affect. Her speech is normal and behavior is normal. Thought content normal.    ED Course  Procedures (including critical care time)  EKG  Date: 08/17/2012  Rate: 114  Rhythm: sinus tachycardia  QRS Axis: normal  Intervals: normal  ST/T Wave abnormalities: nonspecific ST changes  Conduction Disutrbances:none  Narrative Interpretation:   Old EKG Reviewed: unchanged    Labs Reviewed  CBC WITH DIFFERENTIAL - Abnormal; Notable for the following:    WBC 10.6 (*)    RBC 5.59 (*)    Hemoglobin 18.3 (*)    HCT 51.6 (*)    Neutrophils Relative % 81 (*)    Neutro Abs 8.6 (*)    Lymphocytes Relative 10 (*)    All other components within normal limits  BASIC METABOLIC PANEL - Abnormal; Notable for the following:    Sodium 128 (*)    Chloride 87 (*)    GFR calc non Af Amer 89 (*)    All other components within normal limits  PRO B NATRIURETIC PEPTIDE - Abnormal; Notable for the following:    Pro B Natriuretic peptide (BNP) 1142.0 (*)    All other components within normal limits  TROPONIN I  URINALYSIS, ROUTINE W REFLEX MICROSCOPIC   Dg Chest 2 View  08/17/2012   *RADIOLOGY REPORT*  Clinical Data: Shortness of breath, cough.   CHEST - 2 VIEW  Comparison: 06/07/2012  Findings: Mild hyperinflation of the lungs.  Heart is upper limits normal in size.  Tortuosity of the thoracic aorta.  Lungs are clear.  No effusions.  No acute bony abnormality.  Stable compression fractures in the mid and lower thoracic spine.  IMPRESSION: No acute cardiopulmonary disease.   Original Report Authenticated By: Charlett Nose, M.D.     Diagnosis: COPD exacerbation    MDM  Patient comes to the ER for evaluation of shortness of breath. Patient reports that she has no lung disease, records reflect a history of COPD. Patient not currently using bronchodilators at home. Chest  x-ray did not show any acute abnormalities. Patient was tachycardic and did not have any obvious wheezing or, so CT angiography was performed to rule out PE. No PE is seen. Patient is hypoxic, blood gas shows hypoxia without CO2 retention.   The patient does also have history of CHF according to the records. Her appetite is slightly elevated. She does report that her symptoms are worse when she is lying flat. Cannot rule out CHF exacerbation as a cause of her hypoxia. Chest x-ray does not show any obvious edema.  The patient will require hospitalization for further management. She was treated with Xopenex here in the ER without any significant improvement. She has been given Levaquin and Solu-Medrol.        Gilda Crease, MD 08/17/12 1624  Gilda Crease, MD 08/17/12 [phone]

## 2012-08-17 NOTE — ED Notes (Signed)
Per EMS- shortness of breath started this AM at 8.  02 sat on arrival 86 on RA. Started neb albuterol and atrovent on scene. Wheeze and Rhonchi throughout.  98% on NEB. Pt is noncompliant with Neb in route.Denies chest pain at present. Pt DX with UTI last Wednesday and fall.  Pt is warm to touch alert and active with care.

## 2012-08-17 NOTE — H&P (Signed)
PCP:   No primary provider on file.   Chief Complaint:  Recent fall, back pain and progressive SOB.   HPI: This is a 72 year old female, with known history of HTN, previous T6 and T10  compression fractures, s/p T10 vertebroplasty, chronic back pain, s/p back surgery, dyslipidemia, anxiety, COPD and chronic hyponatremia, secondary to psychogenic polydipsia, recurrent falls. Patient fell on 08/11/12, and has since been occasionally troubled by back pain, although not incapacitated. She was diagnosed with a UTI a few days ago, and prescribed Nitrofurantoin. According to patient she has become progressively SOB with an occasional cough over the last 3-4 days, without chest pain, fever or chills. Her care-giver got concerned today, and called 911. In the ED, ABG demonstrated PH 7.513, PCO2 33.9, PO2 56.1, and Bicarb 27.1.    Allergies:  No Known Allergies    Past Medical History  Diagnosis Date  . Back pain   . Hypertension   . COPD (chronic obstructive pulmonary disease)   . Anxiety   . Hyperlipemia   . Compression fracture     Past Surgical History  Procedure Laterality Date  . Back surgery      Prior to Admission medications   Medication Sig Start Date End Date Taking? Authorizing Provider  cholecalciferol (VITAMIN D) 1000 UNITS tablet Take 1,000 Units by mouth daily.   Yes Historical Provider, MD  lisinopril (PRINIVIL,ZESTRIL) 40 MG tablet Take 40 mg by mouth daily.   Yes Historical Provider, MD  Multiple Vitamins-Minerals (MULTIVITAMIN WITH MINERALS) tablet Take 1 tablet by mouth daily.     Yes Historical Provider, MD  nitrofurantoin (MACRODANTIN) 100 MG capsule Take 100 mg by mouth 2 (two) times daily. Started on 08-12-12 for 7 days   Yes Historical Provider, MD  traMADol (ULTRAM) 50 MG tablet Take 50 mg by mouth every 6 (six) hours as needed (pain).   Yes Historical Provider, MD    Social History: Patient reports that she has been smoking Cigarettes.  She has been smoking  about 0.00 packs per day. She has never used smokeless tobacco. She reports that  drinks alcohol, about 1-2 glasses of wine daily. She reports that she does not use illicit drugs.  Family History: Positive for brother who died of pancreatic cancer.   Review of Systems:  As per HPI and chief complaint. Patent denies fatigue, diminished appetite, weight loss, fever, chills, headache, blurred vision, difficulty in speaking, dysphagia, chest pain, cough, orthopnea, paroxysmal nocturnal dyspnea, nausea, diaphoresis, abdominal pain, vomiting, diarrhea, belching, heartburn, hematemesis, melena, dysuria, nocturia, urinary frequency, hematochezia, lower extremity swelling, pain, or redness. The rest of the systems review is negative.  Physical Exam:  General:  Patient does not appear to be in obvious acute distress. Has an occasional "fruity" cough, alert, communicative, fully oriented, talking in complete sentences, not short of breath at rest.  HEENT:  No clinical pallor, no jaundice, no conjunctival injection or discharge. NECK:  Supple, JVP not seen, no carotid bruits, no palpable lymphadenopathy, no palpable goiter. CHEST:  Clinically clear to auscultation, no wheezes, no crackles. HEART:  Sounds 1 and 2 heard, normal, regular, no murmurs. ABDOMEN:  Full, soft, non-tender, no palpable organomegaly, no palpable masses, normal bowel sounds. GENITALIA:  Not examined. LOWER EXTREMITIES:  No pitting edema, palpable peripheral pulses. MUSCULOSKELETAL SYSTEM:  Generalized osteoarthritic changes, otherwise, normal. She has a pronounced dorsal kyphosis.  CENTRAL NERVOUS SYSTEM:  No focal neurologic deficit on gross examination.  Labs on Admission:  Results for orders placed during  the hospital encounter of 08/17/12 (from the past 48 hour(s))  CBC WITH DIFFERENTIAL     Status: Abnormal   Collection Time    08/17/12  9:42 AM      Result Value Range   WBC 10.6 (*) 4.0 - 10.5 K/uL   RBC 5.59 (*) 3.87 -  5.11 MIL/uL   Hemoglobin 18.3 (*) 12.0 - 15.0 g/dL   HCT 16.1 (*) 09.6 - 04.5 %   MCV 92.3  78.0 - 100.0 fL   MCH 32.7  26.0 - 34.0 pg   MCHC 35.5  30.0 - 36.0 g/dL   RDW 40.9  81.1 - 91.4 %   Platelets 350  150 - 400 K/uL   Neutrophils Relative % 81 (*) 43 - 77 %   Neutro Abs 8.6 (*) 1.7 - 7.7 K/uL   Lymphocytes Relative 10 (*) 12 - 46 %   Lymphs Abs 1.1  0.7 - 4.0 K/uL   Monocytes Relative 9  3 - 12 %   Monocytes Absolute 1.0  0.1 - 1.0 K/uL   Eosinophils Relative 0  0 - 5 %   Eosinophils Absolute 0.0  0.0 - 0.7 K/uL   Basophils Relative 0  0 - 1 %   Basophils Absolute 0.0  0.0 - 0.1 K/uL  BASIC METABOLIC PANEL     Status: Abnormal   Collection Time    08/17/12  9:42 AM      Result Value Range   Sodium 128 (*) 135 - 145 mEq/L   Potassium 4.0  3.5 - 5.1 mEq/L   Chloride 87 (*) 96 - 112 mEq/L   CO2 27  19 - 32 mEq/L   Glucose, Bld 98  70 - 99 mg/dL   BUN 11  6 - 23 mg/dL   Creatinine, Ser 7.82  0.50 - 1.10 mg/dL   Calcium 9.6  8.4 - 95.6 mg/dL   GFR calc non Af Amer 89 (*) >90 mL/min   GFR calc Af Amer >90  >90 mL/min   Comment:            The eGFR has been calculated     using the CKD EPI equation.     This calculation has not been     validated in all clinical     situations.     eGFR's persistently     <90 mL/min signify     possible Chronic Kidney Disease.  PRO B NATRIURETIC PEPTIDE     Status: Abnormal   Collection Time    08/17/12  9:42 AM      Result Value Range   Pro B Natriuretic peptide (BNP) 1142.0 (*) 0 - 125 pg/mL  TROPONIN I     Status: None   Collection Time    08/17/12  9:42 AM      Result Value Range   Troponin I <0.30  <0.30 ng/mL   Comment:            Due to the release kinetics of cTnI,     a negative result within the first hours     of the onset of symptoms does not rule out     myocardial infarction with certainty.     If myocardial infarction is still suspected,     repeat the test at appropriate intervals.  URINALYSIS, ROUTINE W  REFLEX MICROSCOPIC     Status: Abnormal   Collection Time    08/17/12 12:28 PM      Result Value Range  Color, Urine AMBER (*) YELLOW   Comment: BIOCHEMICALS MAY BE AFFECTED BY COLOR   APPearance CLEAR  CLEAR   Specific Gravity, Urine 1.023  1.005 - 1.030   pH 6.0  5.0 - 8.0   Glucose, UA NEGATIVE  NEGATIVE mg/dL   Hgb urine dipstick NEGATIVE  NEGATIVE   Bilirubin Urine SMALL (*) NEGATIVE   Ketones, ur 40 (*) NEGATIVE mg/dL   Protein, ur 295 (*) NEGATIVE mg/dL   Urobilinogen, UA 1.0  0.0 - 1.0 mg/dL   Nitrite NEGATIVE  NEGATIVE   Leukocytes, UA NEGATIVE  NEGATIVE  URINE MICROSCOPIC-ADD ON     Status: Abnormal   Collection Time    08/17/12 12:28 PM      Result Value Range   Squamous Epithelial / LPF FEW (*) RARE   Bacteria, UA FEW (*) RARE   Urine-Other MUCOUS PRESENT    BLOOD GAS, ARTERIAL     Status: Abnormal   Collection Time    08/17/12  2:38 PM      Result Value Range   FIO2 0.21     Delivery systems ROOM AIR     pH, Arterial 7.513 (*) 7.350 - 7.450   pCO2 arterial 33.9 (*) 35.0 - 45.0 mmHg   pO2, Arterial 56.1 (*) 80.0 - 100.0 mmHg   Bicarbonate 27.1 (*) 20.0 - 24.0 mEq/L   TCO2 22.3  0 - 100 mmol/L   Acid-Base Excess 4.8 (*) 0.0 - 2.0 mmol/L   O2 Saturation 91.8     Patient temperature 98.6     Collection site RIGHT RADIAL     Drawn by [id number]     Sample type ARTERIAL DRAW     Allens test (pass/fail) PASS  PASS    Radiological Exams on Admission: Dg Chest 2 View  08/17/2012   *RADIOLOGY REPORT*  Clinical Data: Shortness of breath, cough.  CHEST - 2 VIEW  Comparison: 06/07/2012  Findings: Mild hyperinflation of the lungs.  Heart is upper limits normal in size.  Tortuosity of the thoracic aorta.  Lungs are clear.  No effusions.  No acute bony abnormality.  Stable compression fractures in the mid and lower thoracic spine.  IMPRESSION: No acute cardiopulmonary disease.   Original Report Authenticated By: Charlett Nose, M.D.   Dg Thoracic Spine 2 View  08/17/2012    *RADIOLOGY REPORT*  Clinical Data: Fall, back pain  THORACIC SPINE - 2 VIEW  Comparison: CT chest dated 06/07/2012  Findings: Exaggerated thoracic kyphosis.  No evidence of acute fracture or dislocation.  Moderate compression deformity at T6 and T10, stable.  Prior vertebral augmentation at T10.  Mild multilevel degenerative changes.  Visualized lungs are clear.  IMPRESSION: No evidence of acute fracture or dislocation.  Prior vertebral augmentation at T6.  Moderate compression deformity at T10, stable.   Original Report Authenticated By: Charline Bills, M.D.   Dg Lumbar Spine Complete  08/17/2012   *RADIOLOGY REPORT*  Clinical Data: Fall, back pain  LUMBAR SPINE - COMPLETE 4+ VIEW  Comparison: 08/20/2010  Findings: Five lumbar-type vertebral bodies.  Exaggerated lumbar lordosis.  No evidence of fracture or dislocation.  Vertebral body heights are maintained.  Mild multilevel degenerative changes.  Grade 1 anterolisthesis of L3 on L4 and L4 on L5, stable.  Visualized bony pelvis appears intact.  Vascular calcifications.  IMPRESSION: No fracture or dislocation is seen.  Mild degenerative changes with stable alignment.   Original Report Authenticated By: Charline Bills, M.D.   Ct Angio Chest Pe W/cm &/or Wo Cm  08/17/2012   *RADIOLOGY REPORT*  Clinical Data: Shortness of breath, cough.  CT ANGIOGRAPHY CHEST  Technique:  Multidetector CT imaging of the chest using the standard protocol during bolus administration of intravenous contrast. Multiplanar reconstructed images including MIPs were obtained and reviewed to evaluate the vascular anatomy.  Contrast: 100 ml Omnipaque 300 IV.  Comparison: 06/07/2012  Findings: No filling defects in the pulmonary arteries to suggest pulmonary emboli.  There is mucus plugging within the lower lobe bronchi bilaterally.  And right basilar and dependent atelectasis. Lungs otherwise clear.  No pleural effusions.  Heart is mildly enlarged.  Scattered coronary artery  calcifications. No mediastinal, hilar, or axillary adenopathy.  Visualized thyroid and chest wall soft tissues unremarkable. Imaging into the upper abdomen shows no acute findings.    Stable multiple old thoracic spine compression fractures and sternal fracture.  IMPRESSION: No evidence of pulmonary embolus.  Mucus plugging in the lower lobe bronchi bilaterally.   Original Report Authenticated By: Charlett Nose, M.D.    Assessment/Plan Active Problems:   1. Hypoxemic respiratory failure, chronic: Patient presented with progressive SOB over 3-4 days, with production of scanty phlegm, but no fever, chills or chest pain She was found to be hypoxemic in the ED, with ABGs revealing PH 7.513, PCO2 33.9, PO2 56.1, and Bicarb 27.1, consistent with chronic respiratory failure, although there may be an acute component. CTA showed no evidence of PE, pneumonic consolidation or CHF, although there was mucus plugging in the lower lobe bronchi bilaterally. Will admit to the SDU, manage with bronchodilators, mucolytics and oxygen supplementation, as well as antibiotics (Levaquin) and steroids. For repeat ABG in AM 08/18/12. If no improvement pulmonary consultation may be beneficial. Will likely need home O2 on discharge.  2. COPD (chronic obstructive pulmonary disease): Patient clinically, does not appear to be in acute exacerbation, as she has no wheeze, and is not in acute discomfort. Managing as above. Will add Spiriva to her treatment. Patient unfortunately continues to smoke, although she insists that she only has a few puffs of a cigarette per day. Counseled.  4. UTI (lower urinary tract infection): Per patient, she was recently diagnosed with a UTI, and commenced on Nitrofurantoin. U/A is negative at this time.  5. Chronic hyponatremia: Patient has a long history of chronic hyponatremia, deemed secondary to psychogenic polydipsia after, repeated work up for alternative etiologies, including SIADH. Serum sodium appears  curently at baseline, at 128.  6. Back pain: Patient has a history of chronic back pain, and sustained afall on 08/12/12, with some exacerbation. X-rays are unremarkable for acute bony injury. Not currently problematic. Will manage with analgesics. 7. HTN (hypertension): BP is sub-optimally controlled at this time. Will continue pre-admission anti-hypertensives, observe and adjust as indicated.   Further management will depend on clinical course.   Comment: Patient is FULL CODE.    Time Spent on Admission: 1 hour.   Ovide Dusek,CHRISTOPHER 08/17/2012, 5:45 PM

## 2012-08-18 DIAGNOSIS — I10 Essential (primary) hypertension: Secondary | ICD-10-CM

## 2012-08-18 LAB — BLOOD GAS, ARTERIAL
Bicarbonate: 25.5 mEq/L — ABNORMAL HIGH (ref 20.0–24.0)
O2 Saturation: 94 %
Patient temperature: 97
TCO2: 21.2 mmol/L (ref 0–100)

## 2012-08-18 LAB — BASIC METABOLIC PANEL
BUN: 14 mg/dL (ref 6–23)
GFR calc non Af Amer: >90 mL/min (ref >90)
Glucose, Bld: 148 mg/dL — ABNORMAL HIGH (ref 70–99)
Potassium: 3.9 mEq/L (ref 3.5–5.1)

## 2012-08-18 LAB — CBC
HCT: 45.8 % (ref 36.0–46.0)
Hemoglobin: 16.3 g/dL — ABNORMAL HIGH (ref 12.0–15.0)
MCH: 32.6 pg (ref 26.0–34.0)
MCHC: 35.6 g/dL (ref 30.0–36.0)

## 2012-08-18 MED ORDER — ALBUTEROL SULFATE (5 MG/ML) 0.5% IN NEBU
2.5000 mg | INHALATION_SOLUTION | RESPIRATORY_TRACT | Status: DC
  Administered 2012-08-18 – 2012-08-19 (×9): 2.5 mg via RESPIRATORY_TRACT
  Filled 2012-08-18 (×9): qty 0.5

## 2012-08-18 MED ORDER — IPRATROPIUM BROMIDE 0.02 % IN SOLN
0.5000 mg | RESPIRATORY_TRACT | Status: DC
  Administered 2012-08-18 – 2012-08-19 (×9): 0.5 mg via RESPIRATORY_TRACT
  Filled 2012-08-18 (×9): qty 2.5

## 2012-08-18 MED ORDER — LEVOFLOXACIN 750 MG PO TABS
750.0000 mg | ORAL_TABLET | Freq: Every day | ORAL | Status: DC
  Administered 2012-08-18 – 2012-08-19 (×2): 750 mg via ORAL
  Filled 2012-08-18 (×3): qty 1

## 2012-08-18 NOTE — Progress Notes (Signed)
[facility] PROGRESS NOTE  [name] QMV:[id number] DOB: 10-Oct-1940 DOA: 08/17/2012 PCP: No primary provider on file.  Assessment/Plan: 1-Acute on chronic Hypoxemic respiratory failure: Secondary to mucus plug. Continue with antibiotics, Guaifenesin. Start flutter valve. Add albuterol, ipratropium treatments.  2-COPD (chronic obstructive pulmonary disease): continue with albuterol, ipratropium.  3-UTI (lower urinary tract infection): UA negative. Was diagnose with UTI outpatient. Levaquin should cover.  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 sodium decrease to 126. Water restriction.  5-Back pain, chronic: pain management.  6-HTN (hypertension): DC lisinopril to avoid worsening hyponatremia. Will add Norvasc for HN.    Code Status: Full  Family Communication: none at bedside.  Disposition Plan: Remain in step down.    Consultants:  None  Procedures:  none  Antibiotics:  Levaquin 5-27  HPI/Subjective: She doesn't know how is her breathing yet. She wants something to eat and the news paper.  Coughing some.   Objective: Filed Vitals:   08/18/12 0157 08/18/12 0200 08/18/12 0400 08/18/12 0600  BP:  163/102 143/83 139/84  Pulse: 76 81 87 87  Temp:   98.2 F (36.8 C)   TempSrc:   Oral   Resp: 18 18 18 16   Height:      Weight:      SpO2: 96% 95% 95% 96%    Intake/Output Summary (Last 24 hours) at 08/18/12 0757 Last data filed at 08/18/12 0600  Gross per 24 hour  Intake    633 ml  Output    750 ml  Net   -117 ml   Filed Weights   08/17/12 1808  Weight: 44.7 kg (98 lb 8.7 oz)    Exam:   General:  No distress.   Cardiovascular: S 1, S 2 RRR  Respiratory: Bilateral ronchus and crackles.   Abdomen: Bs present, soft, NT  Musculoskeletal: no edema  Data Reviewed: Basic Metabolic Panel:  Recent Labs Lab 08/17/12 0942 08/18/12 0342  NA 128* 126*  K 4.0 3.9  CL 87* 88*  CO2 27 27  GLUCOSE 98 148*  BUN 11  14  CREATININE 0.58 0.55  CALCIUM 9.6 9.2   Liver Function Tests: No results found for this basename: AST, ALT, ALKPHOS, BILITOT, PROT, ALBUMIN,  in the last 168 hours No results found for this basename: LIPASE, AMYLASE,  in the last 168 hours No results found for this basename: AMMONIA,  in the last 168 hours CBC:  Recent Labs Lab 08/17/12 0942 08/18/12 0342  WBC 10.6* 6.9  NEUTROABS 8.6*  --   HGB 18.3* 16.3*  HCT 51.6* 45.8  MCV 92.3 91.6  PLT 350 326   Cardiac Enzymes:  Recent Labs Lab 08/17/12 0942  TROPONINI <0.30   BNP (last 3 results)  Recent Labs  08/17/12 0942  PROBNP 1142.0*   CBG: No results found for this basename: GLUCAP,  in the last 168 hours  Recent Results (from the past 240 hour(s))  MRSA PCR SCREENING     Status: None   Collection Time    08/17/12  6:23 PM      Result Value Range Status   MRSA by PCR NEGATIVE  NEGATIVE Final   Comment:            The GeneXpert MRSA Assay (FDA     approved for NASAL specimens     only), is one component of a     comprehensive MRSA colonization     surveillance program. It is not     intended  to diagnose MRSA     infection nor to guide or     monitor treatment for     MRSA infections.     Studies: Dg Chest 2 View  08/17/2012   *RADIOLOGY REPORT*  Clinical Data: Shortness of breath, cough.  CHEST - 2 VIEW  Comparison: 06/07/2012  Findings: Mild hyperinflation of the lungs.  Heart is upper limits normal in size.  Tortuosity of the thoracic aorta.  Lungs are clear.  No effusions.  No acute bony abnormality.  Stable compression fractures in the mid and lower thoracic spine.  IMPRESSION: No acute cardiopulmonary disease.   Original Report Authenticated By: Charlett Nose, M.D.   Dg Thoracic Spine 2 View  08/17/2012   *RADIOLOGY REPORT*  Clinical Data: Fall, back pain  THORACIC SPINE - 2 VIEW  Comparison: CT chest dated 06/07/2012  Findings: Exaggerated thoracic kyphosis.  No evidence of acute fracture or  dislocation.  Moderate compression deformity at T6 and T10, stable.  Prior vertebral augmentation at T10.  Mild multilevel degenerative changes.  Visualized lungs are clear.  IMPRESSION: No evidence of acute fracture or dislocation.  Prior vertebral augmentation at T6.  Moderate compression deformity at T10, stable.   Original Report Authenticated By: Charline Bills, M.D.   Dg Lumbar Spine Complete  08/17/2012   *RADIOLOGY REPORT*  Clinical Data: Fall, back pain  LUMBAR SPINE - COMPLETE 4+ VIEW  Comparison: 08/20/2010  Findings: Five lumbar-type vertebral bodies.  Exaggerated lumbar lordosis.  No evidence of fracture or dislocation.  Vertebral body heights are maintained.  Mild multilevel degenerative changes.  Grade 1 anterolisthesis of L3 on L4 and L4 on L5, stable.  Visualized bony pelvis appears intact.  Vascular calcifications.  IMPRESSION: No fracture or dislocation is seen.  Mild degenerative changes with stable alignment.   Original Report Authenticated By: Charline Bills, M.D.   Ct Angio Chest Pe W/cm &/or Wo Cm  08/17/2012   *RADIOLOGY REPORT*  Clinical Data: Shortness of breath, cough.  CT ANGIOGRAPHY CHEST  Technique:  Multidetector CT imaging of the chest using the standard protocol during bolus administration of intravenous contrast. Multiplanar reconstructed images including MIPs were obtained and reviewed to evaluate the vascular anatomy.  Contrast: 100 ml Omnipaque 300 IV.  Comparison: 06/07/2012  Findings: No filling defects in the pulmonary arteries to suggest pulmonary emboli.  There is mucus plugging within the lower lobe bronchi bilaterally.  And right basilar and dependent atelectasis. Lungs otherwise clear.  No pleural effusions.  Heart is mildly enlarged.  Scattered coronary artery calcifications. No mediastinal, hilar, or axillary adenopathy.  Visualized thyroid and chest wall soft tissues unremarkable. Imaging into the upper abdomen shows no acute findings.    Stable multiple old  thoracic spine compression fractures and sternal fracture.  IMPRESSION: No evidence of pulmonary embolus.  Mucus plugging in the lower lobe bronchi bilaterally.   Original Report Authenticated By: Charlett Nose, M.D.    Scheduled Meds: . albuterol  2.5 mg Nebulization Q6H  . guaiFENesin  1,200 mg Oral BID  . heparin  5,000 Units Subcutaneous Q8H  . levofloxacin (LEVAQUIN) IV  750 mg Intravenous Q24H  . methylPREDNISolone (SOLU-MEDROL) injection  40 mg Intravenous Q8H  . multivitamin with minerals  1 tablet Oral Daily  . sodium chloride  3 mL Intravenous Q12H  . tiotropium  18 mcg Inhalation Daily   Continuous Infusions:   Active Problems:   Hypoxemic respiratory failure, chronic   Chronic hyponatremia   UTI (lower urinary tract infection)  Back pain   COPD (chronic obstructive pulmonary disease)   HTN (hypertension)    Time spent: 35 minutes.     Ronnae Kaser  [facility] Pager [phone]. If 7PM-7AM, please contact night-coverage at [email or web address], password [facility] 08/18/2012, 7:57 AM  LOS: 1 day

## 2012-08-18 NOTE — Evaluation (Signed)
Physical Therapy Evaluation Patient Details Name: [name] MRN: [id number] DOB: 09/27/1940 Today's Date: 08/18/2012 Time: 5621-3086 PT Time Calculation (min): 24 min  PT Assessment / Plan / Recommendation Clinical Impression  72 yo female admitted with hypoxemic respiratory failure, recent fall 5/21. Hx of T6, T10 compression fractures with vertebroplasty for T10, COPD, chronic back pain, falls. On eval, pt required +2 assist for safety, mobility-able to ambulate ~20 feet with RW. Demonstrates general weakness, decreased activity tolerance, impaired balance. Recommend SNF for continued rehab at this time. Pt prefers to d/c home.     PT Assessment  Patient needs continued PT services    Follow Up Recommendations  SNF    Does the patient have the potential to tolerate intense rehabilitation      Barriers to Discharge        Equipment Recommendations  Rolling walker with 5" wheels    Recommendations for Other Services OT consult   Frequency Min 3X/week    Precautions / Restrictions Precautions Precautions: Fall Restrictions Weight Bearing Restrictions: No   Pertinent Vitals/Pain Pre-session: 128 bpm, 89% RA (pt had removed [place]) 95% 2L During session: 151 bpm, 83% 2-3L O2 End of session: 126 bpm, 95% 2L      Mobility  Bed Mobility Bed Mobility: Rolling Left;Left Sidelying to Sit Rolling Left: 5: Supervision Left Sidelying to Sit: 4: Min guard Supine to Sit: 4: Min guard Sitting - Scoot to [place] of Bed: 5: Supervision Details for Bed Mobility Assistance: vc for log rolling Transfers Transfers: Sit to Stand;Stand to Sit Sit to Stand: 1: +2 Total assist;From bed Sit to Stand: Patient Percentage: 70% Stand to Sit: To chair/3-in-1;With armrests;4: Min assist Details for Transfer Assistance: VCs safety, technique, hand placement. Assist to rise, stabilize, control descent. Very unsteady.  Ambulation/Gait Ambulation/Gait Assistance: 1: +2 Total assist Ambulation/Gait:  Patient Percentage: 70% Ambulation Distance (Feet): 20 Feet (x 2) Assistive device: 2 person hand held assist;Rolling walker Ambulation/Gait Assistance Details: ambulated to bathroom with 2 hha, pt very unsteady with posterior leaning noted initially. Used RW, bathroom to recliner, some improvement in stability but pt still requiring external assist for balance and maneuvering RW. HR increased to 150s, O2 sats dropped to 83% on 2 L. Fatigues easily.  Gait Pattern: Step-through pattern;Decreased stride length;Wide base of support    Exercises General Exercises - Lower Extremity Ankle Pumps: 5 reps, active, bilateral Long arc quads: 2 reps, active, bilateral Encouraged pt to perform exercises while sitting in chair, as tolerated    PT Diagnosis: Difficulty walking;Abnormality of gait;Generalized weakness  PT Problem List: Decreased strength;Decreased activity tolerance;Decreased balance;Decreased mobility;Decreased knowledge of use of DME;Cardiopulmonary status limiting activity PT Treatment Interventions: DME instruction;Gait training;Functional mobility training;Therapeutic activities;Therapeutic exercise;Balance training;Patient/family education   PT Goals Acute Rehab PT Goals PT Goal Formulation: With patient Time For Goal Achievement: 09/01/12 Potential to Achieve Goals: Good Pt will go Supine/Side to Sit: with supervision PT Goal: Supine/Side to Sit - Progress: Goal set today Pt will go Sit to Supine/Side: with supervision PT Goal: Sit to Supine/Side - Progress: Goal set today Pt will go Sit to Stand: with supervision PT Goal: Sit to Stand - Progress: Goal set today Pt will Ambulate: 51 - 150 feet;with supervision;with least restrictive assistive device PT Goal: Ambulate - Progress: Goal set today Pt will Perform Home Exercise Program: with supervision, verbal cues required/provided PT Goal: Perform Home Exercise Program - Progress: Goal set today  Visit Information  Last PT  Received On: 08/18/12 Assistance Needed: +2  PT/OT Co-Evaluation/Treatment: Yes    Subjective Data  Subjective: I just stubbed my toe when I fell last week Patient Stated Goal: pr prefers to d/c home   Prior Functioning  Home Living Lives With: Alone Available Help at Discharge: Family;Available PRN/intermittently Type of Home: House Home Access: Level entry Home Layout: One level Bathroom Shower/Tub: Health visitor: Standard Bathroom Accessibility: Yes How Accessible: Accessible via walker Home Adaptive Equipment: Grab bars in shower;Built-in shower seat Prior Function Level of Independence: Independent Able to Take Stairs?: Yes Driving: Yes Vocation: Retired Musician: [employer] Dominant Hand: Right    Cognition  Cognition Arousal/Alertness: Awake/alert Behavior During Therapy: Anxious (when mobilizing) Overall Cognitive Status: No family/caregiver present to determine baseline cognitive functioning (appeared impulsive at times. decreased safety/judgement)    Extremity/Trunk Assessment Right Upper Extremity Assessment RUE ROM/Strength/Tone: WFL for tasks assessed Left Upper Extremity Assessment LUE ROM/Strength/Tone: WFL for tasks assessed Right Lower Extremity Assessment RLE ROM/Strength/Tone: Deficits RLE ROM/Strength/Tone Deficits: Strength at least 4/5 with functional activity Left Lower Extremity Assessment LLE ROM/Strength/Tone: Deficits LLE ROM/Strength/Tone Deficits: Strength at least 4/5 with functional activity Trunk Assessment Trunk Assessment: Kyphotic   Balance Balance Balance Assessed: Yes Static Sitting Balance Static Sitting - Balance Support: Feet supported;No upper extremity supported (SBA) Static Standing Balance Static Standing - Balance Support: Bilateral upper extremity supported;During functional activity Static Standing - Level of Assistance: 3: Mod assist;Other (comment) (posterior bias. wide stance)  End of  Session PT - End of Session Equipment Utilized During Treatment: Oxygen Activity Tolerance: Patient limited by fatigue Patient left: in chair;with call bell/phone within reach  GP     Rebeca Alert, MPT Pager: [phone]

## 2012-08-18 NOTE — Progress Notes (Signed)
Occupational Therapy Evaluation Patient Details Name: MICHEALE SCHLACK MRN: [id number] DOB: 08/02/1940 Today's Date: 08/18/2012 Time: 4098-1191 OT Time Calculation (min): 24 min  OT Assessment / Plan / Recommendation Clinical Impression  72 year old female, with known history of HTN, previous T6 and T10 compression fractures, s/p T10 vertebroplasty, chronic back pain, s/p back surgery, dyslipidemia, anxiety, COPD and chronic hyponatremia, secondary to psychogenic polydipsia, recurrent falls. Patient fell on 08/11/12, and has since been occasionally troubled by back pain, although not incapacitated. She was diagnosed with a UTI a few days ago, and prescribed Nitrofurantoin. According to patient she has become progressively SOB with an occasional cough over the last 3-4 days. Hx of COPD. PTA, pt lived alone and was independent with ADL and mobility. Pt is not safe to D/C home and will need rehab at SNF prior to D/C home. Pt will benefit from skilled OT services to facilitate D/C to next venue due to below deficits.    OT Assessment  Patient needs continued OT Services    Follow Up Recommendations  SNF    Barriers to Discharge Decreased caregiver support    Equipment Recommendations  3 in 1 bedside comode    Recommendations for Other Services  SW consult for SNF  Frequency  Min 2X/week    Precautions / Restrictions Precautions Precautions: Fall Restrictions Weight Bearing Restrictions: No   Pertinent Vitals/Pain No c/o pain. Pt HR 140s with activity. RR 33. desat to mid 80s 2L with activity. Increased O2 to 3L. Rebound to 94. End of session HR 126. O2 96 2L    ADL  Grooming: Set up;Supervision/safety Where Assessed - Grooming: Supported standing Upper Body Bathing: Supervision/safety;Set up Where Assessed - Upper Body Bathing: Unsupported sitting Lower Body Bathing: Moderate assistance Where Assessed - Lower Body Bathing: Supported sit to stand Upper Body Dressing: Minimal  assistance Where Assessed - Upper Body Dressing: Supported sitting Lower Body Dressing: Moderate assistance Where Assessed - Lower Body Dressing: Supported sit to Pharmacist, hospital: Moderate assistance Toilet Transfer Method: Other (comment) (ambulating) Toilet Transfer Equipment: Comfort height toilet Toileting - Clothing Manipulation and Hygiene: Minimal assistance Where Assessed - Toileting Clothing Manipulation and Hygiene: Sit to stand from 3-in-1 or toilet Equipment Used: Rolling walker Transfers/Ambulation Related to ADLs: mod A. +2 HHA ADL Comments: posterior bias. desat duing activity. tachy. limited by endurance. unsafe behavior    OT Diagnosis: Generalized weakness;Cognitive deficits  OT Problem List: Decreased strength;Decreased activity tolerance;Impaired balance (sitting and/or standing);Decreased safety awareness;Decreased cognition;Decreased knowledge of use of DME or AE;Cardiopulmonary status limiting activity OT Treatment Interventions: Self-care/ADL training;Therapeutic exercise;Energy conservation;DME and/or AE instruction;Therapeutic activities;Cognitive remediation/compensation;Patient/family education;Balance training   OT Goals Acute Rehab OT Goals OT Goal Formulation: With patient Time For Goal Achievement: 09/01/12 Potential to Achieve Goals: Good ADL Goals Pt Will Perform Grooming: with supervision;Unsupported;Standing at sink ADL Goal: Grooming - Progress: Goal set today Pt Will Perform Upper Body Bathing: with set-up;Unsupported;Sitting, edge of bed ADL Goal: Upper Body Bathing - Progress: Goal set today Pt Will Perform Lower Body Bathing: with set-up;Sit to stand from bed;Unsupported;with supervision ADL Goal: Lower Body Bathing - Progress: Goal set today Pt Will Perform Upper Body Dressing: with set-up;with supervision;Sitting, chair ADL Goal: Upper Body Dressing - Progress: Goal set today Pt Will Perform Lower Body Dressing: with set-up;with  supervision;Sit to stand from bed;Unsupported ADL Goal: Lower Body Dressing - Progress: Goal set today Pt Will Transfer to Toilet: with supervision;Ambulation;3-in-1;with DME ADL Goal: Toilet Transfer - Progress: Goal set today Pt Will  Perform Toileting - Clothing Manipulation: with supervision;Standing;Sitting on 3-in-1 or toilet ADL Goal: Toileting - Clothing Manipulation - Progress: Goal set today Pt Will Perform Toileting - Hygiene: with supervision;Sit to stand from 3-in-1/toilet ADL Goal: Toileting - Hygiene - Progress: Goal set today Arm Goals Pt Will Complete Theraband Exer: with supervision, verbal cues required/provided;2 sets;Level 2 Theraband;Bilateral upper extremities;to increase strength Arm Goal: Theraband Exercises - Progress: Goal set today  Visit Information  Last OT Received On: 08/18/12 Assistance Needed: +2 PT/OT Co-Evaluation/Treatment: Yes    Subjective Data   "I have a problem with my balance."   Prior Functioning     Home Living Lives With: Alone Available Help at Discharge: Family;Available PRN/intermittently Type of Home: House Home Access: Level entry Home Layout: One level Bathroom Shower/Tub: Health visitor: Standard Bathroom Accessibility: Yes How Accessible: Accessible via walker Home Adaptive Equipment: Grab bars in shower;Built-in shower seat Prior Function Level of Independence: Independent Able to Take Stairs?: Yes Driving: Yes 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 decreased safety/judgement)    Extremity/Trunk Assessment Right Upper Extremity Assessment RUE  ROM/Strength/Tone: WFL for tasks assessed Left Upper Extremity Assessment LUE ROM/Strength/Tone: WFL for tasks assessed Right Lower Extremity Assessment RLE ROM/Strength/Tone: Deficits RLE ROM/Strength/Tone Deficits: Strength at least 4/5 with functional activity Left Lower Extremity Assessment LLE ROM/Strength/Tone: Deficits LLE ROM/Strength/Tone Deficits: Strength at least 4/5 with functional activity Trunk Assessment Trunk Assessment: Kyphotic     Mobility Bed Mobility Bed Mobility: Rolling Left;Left Sidelying to Sit Rolling Left: 5: Supervision Left Sidelying to Sit: 4: Min guard Supine to Sit: 4: Min guard Sitting - Scoot to [place] of Bed: 5: Supervision Details for Bed Mobility Assistance: vc for log rolling Transfers Transfers: Sit to Stand;Stand to Sit Sit to Stand: 1: +2 Total assist;From bed Sit to Stand: Patient Percentage: 70% Stand to Sit: To chair/3-in-1;With armrests;4: Min assist Details for Transfer Assistance: VCs safety, technique, hand placement. Assist to rise, stabilize, control descent. Very unsteady.      Exercise General Exercises - Upper Extremity Shoulder Flexion: AROM;Both;5 reps Shoulder Extension: AROM;Both;5 reps Shoulder ABduction: AROM;Both;5 reps Shoulder ADduction: AROM;Both;5 reps Elbow Flexion: AROM;Both;5 reps Elbow Extension: AROM;Both;5 reps   Balance Balance Balance Assessed: Yes Static Sitting Balance Static Sitting - Balance Support: Feet supported;No upper extremity supported (SBA) Static Standing Balance Static Standing - Balance Support: Bilateral upper extremity supported;During functional activity Static Standing - Level of Assistance: 3: Mod assist;Other (comment) (posterior bias. wide stance)   End of Session OT - End of Session Activity Tolerance: Patient limited by fatigue Patient left: in chair;with call bell/phone within reach Nurse Communication: Mobility status  GO     Cadyn Fann,HILLARY 08/18/2012, 10:00 AM [facility], OTR/L  [phone] 08/18/2012

## 2012-08-18 NOTE — Progress Notes (Signed)
Bladder scanned.  Only 23 ml of urine observed.  Notified Dr. Sunnie Nielsen of urine output for the day.

## 2012-08-18 NOTE — Progress Notes (Signed)
CARE MANAGEMENT NOTE 08/18/2012  Patient:  DELORUS, LANGWELL   Account Number:  [id number]  Date Initiated:  08/18/2012  Documentation initiated by:  Murel Wigle  Subjective/Objective Assessment:   recent uti, hx of copd now with exacerbation, failed outpt treatment     Action/Plan:   from home and lives alone has been indep in her adls   Anticipated DC Date:  08/21/2012   Anticipated DC Plan:  HOME/SELF CARE  In-house referral  NA      DC Planning Services  NA      [facility] Choice  NA   Choice offered to / List presented to:  NA   DME arranged  NA      DME agency  NA     HH arranged  NA      HH agency  NA   Status of service:  In process, will continue to follow Medicare Important Message given?  NA - LOS <3 / Initial given by admissions (If response is "NO", the following Medicare IM given date fields will be blank) Date Medicare IM given:   Date Additional Medicare IM given:    Discharge Disposition:    Per UR Regulation:    If discussed at Long Length of Stay Meetings, dates discussed:    Comments:  [id number] Earlene Plater, RN, BSN, CCM:  CHART REVIEWED AND UPDATED.  Next chart review due on [id number]. NO DISCHARGE NEEDS PRESENT AT THIS TIME. CASE MANAGEMENT [phone]

## 2012-08-19 ENCOUNTER — Encounter (HOSPITAL_COMMUNITY): Payer: Self-pay | Admitting: *Deleted

## 2012-08-19 DIAGNOSIS — I369 Nonrheumatic tricuspid valve disorder, unspecified: Secondary | ICD-10-CM

## 2012-08-19 LAB — BASIC METABOLIC PANEL
BUN: 16 mg/dL (ref 6–23)
BUN: 16 mg/dL (ref 6–23)
CO2: 27 mEq/L (ref 19–32)
CO2: 29 mEq/L (ref 19–32)
Chloride: 87 mEq/L — ABNORMAL LOW (ref 96–112)
Chloride: 86 mEq/L — ABNORMAL LOW (ref 96–112)
Creatinine, Ser: 0.61 mg/dL (ref 0.50–1.10)
Creatinine, Ser: 0.73 mg/dL (ref 0.50–1.10)
GFR calc Af Amer: >90 mL/min (ref >90)
Glucose, Bld: 138 mg/dL — ABNORMAL HIGH (ref 70–99)
Glucose, Bld: 167 mg/dL — ABNORMAL HIGH (ref 70–99)
Potassium: 3.5 mEq/L (ref 3.5–5.1)
Potassium: 4 mEq/L (ref 3.5–5.1)

## 2012-08-19 LAB — CBC
HCT: 43.5 % (ref 36.0–46.0)
Hemoglobin: 15.5 g/dL — ABNORMAL HIGH (ref 12.0–15.0)
MCV: 90.2 fL (ref 78.0–100.0)
WBC: 20.2 10*3/uL — ABNORMAL HIGH (ref 4.0–10.5)

## 2012-08-19 MED ORDER — ENSURE COMPLETE PO LIQD
237.0000 mL | ORAL | Status: DC
  Administered 2012-08-21: 237 mL via ORAL

## 2012-08-19 MED ORDER — FOLIC ACID 1 MG PO TABS
1.0000 mg | ORAL_TABLET | Freq: Every day | ORAL | Status: DC
  Administered 2012-08-19 – 2012-08-23 (×5): 1 mg via ORAL
  Filled 2012-08-19 (×6): qty 1

## 2012-08-19 MED ORDER — POTASSIUM CHLORIDE CRYS ER 20 MEQ PO TBCR
40.0000 meq | EXTENDED_RELEASE_TABLET | Freq: Once | ORAL | Status: AC
  Administered 2012-08-19: 40 meq via ORAL
  Filled 2012-08-19: qty 2

## 2012-08-19 MED ORDER — IPRATROPIUM BROMIDE 0.02 % IN SOLN
0.5000 mg | Freq: Three times a day (TID) | RESPIRATORY_TRACT | Status: DC
  Administered 2012-08-20 – 2012-08-21 (×6): 0.5 mg via RESPIRATORY_TRACT
  Filled 2012-08-19 (×9): qty 2.5

## 2012-08-19 MED ORDER — VITAMIN B-1 100 MG PO TABS
100.0000 mg | ORAL_TABLET | Freq: Every day | ORAL | Status: DC
  Administered 2012-08-19 – 2012-08-23 (×5): 100 mg via ORAL
  Filled 2012-08-19 (×6): qty 1

## 2012-08-19 MED ORDER — ALBUTEROL SULFATE (5 MG/ML) 0.5% IN NEBU
2.5000 mg | INHALATION_SOLUTION | Freq: Three times a day (TID) | RESPIRATORY_TRACT | Status: DC
  Administered 2012-08-20 – 2012-08-21 (×6): 2.5 mg via RESPIRATORY_TRACT
  Filled 2012-08-19 (×6): qty 0.5

## 2012-08-19 MED ORDER — FUROSEMIDE 10 MG/ML IJ SOLN
20.0000 mg | Freq: Once | INTRAMUSCULAR | Status: AC
  Administered 2012-08-19: 20 mg via INTRAVENOUS
  Filled 2012-08-19: qty 2

## 2012-08-19 MED ORDER — PREDNISONE 50 MG PO TABS
50.0000 mg | ORAL_TABLET | Freq: Every day | ORAL | Status: DC
  Administered 2012-08-19 – 2012-08-22 (×4): 50 mg via ORAL
  Filled 2012-08-19 (×5): qty 1

## 2012-08-19 NOTE — Progress Notes (Signed)
Echocardiogram 2D Echocardiogram has been performed.  Pacer Dorn 08/19/2012, 3:18 PM

## 2012-08-19 NOTE — Progress Notes (Signed)
Physical Therapy Treatment Patient Details Name: [name] MRN: [id number] DOB: May 29, 1940 Today's Date: 08/19/2012 Time: 1100-1143 PT Time Calculation (min): 43 min  PT Assessment / Plan / Recommendation Comments on Treatment Session  Pt requires encouragement to do as much as she can for herself instead of requesting assistance when she doesn't really need it (toilet hygiene). Continue to recommend SNF.     Follow Up Recommendations  SNF     Does the patient have the potential to tolerate intense rehabilitation     Barriers to Discharge        Equipment Recommendations  Rolling walker with 5" wheels    Recommendations for Other Services OT consult  Frequency Min 3X/week   Plan Discharge plan remains appropriate    Precautions / Restrictions Precautions Precautions: Fall Restrictions Weight Bearing Restrictions: No   Pertinent Vitals/Pain Pt denies pain    Mobility  Bed Mobility Bed Mobility: Supine to Sit;Sit to Supine Supine to Sit: 5: Supervision Sit to Supine: 5: Supervision Transfers Transfers: Sit to Stand;Stand to Sit;Stand Pivot Transfers Sit to Stand: From bed;From chair/3-in-1;4: Min guard;With armrests Stand to Sit: To bed;To chair/3-in-1;4: Min guard;With armrests Stand Pivot Transfers: 4: Min guard Details for Transfer Assistance: x 3. VCs safety, technique, hand placement. Assist to rise, stabilize, control descent. Stand pivot recliner<>BSC.  Ambulation/Gait Ambulation/Gait Assistance: 4: Min assist Ambulation Distance (Feet): 20 Feet Assistive device: Rolling walker Ambulation/Gait Assistance Details: Fatigues easily. VCs safety, technique, distance from RW. Ambulated on 2L O2-sats dropped to 89%.  Gait Pattern: Decreased stride length;Trunk flexed    Exercises     PT Diagnosis:    PT Problem List:   PT Treatment Interventions:     PT Goals Acute Rehab PT Goals Pt will go Supine/Side to Sit: with modified independence PT Goal:  Supine/Side to Sit - Progress: Updated due to goal met Pt will go Sit to Supine/Side: with modified independence PT Goal: Sit to Supine/Side - Progress: Updated due to goal met Pt will go Sit to Stand: with supervision PT Goal: Sit to Stand - Progress: Progressing toward goal Pt will Ambulate: 51 - 150 feet;with supervision;with least restrictive assistive device PT Goal: Ambulate - Progress: Progressing toward goal  Visit Information  Last PT Received On: 08/19/12 Assistance Needed: + (1.5) for equipment    Subjective Data  Subjective: I get tired Patient Stated Goal: none stated   Cognition  Cognition Arousal/Alertness: Awake/alert Behavior During Therapy: WFL for tasks assessed/performed Overall Cognitive Status: Within Functional Limits for tasks assessed    Balance     End of Session PT - End of Session Equipment Utilized During Treatment: Gait belt;Oxygen Activity Tolerance: Patient limited by fatigue Patient left: in chair;with call bell/phone within reach   GP     Rebeca Alert, MPT Pager: [phone]

## 2012-08-19 NOTE — Progress Notes (Signed)
[facility] PROGRESS NOTE  [name]:[id number] DOB: 03-25-1940 DOA: 08/17/2012 PCP: No primary provider on file.  Assessment/Plan: 1-Acute on chronic Hypoxemic respiratory failure: Secondary to mucus plug. Continue with Levaquin day 3, Guaifenesin, flutter valve, albuterol, ipratropium treatments. Will change solumedrol to prednisone. Check BNP.  2-COPD (chronic obstructive pulmonary disease): continue with albuterol, ipratropium.  3-UTI (lower urinary tract infection): UA negative. Was diagnose with UTI outpatient. Levaquin should cover.  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 sodium decrease further. I will check urine osmolality. I will give one dose of lasix. Repeat B-met this afternoon. Check BNP to evaluate for heart failure.  5-Back pain, chronic: pain management.  6-HTN (hypertension): DC lisinopril to avoid worsening hyponatremia. Continue with Norvasc.  7-Leukocytosis: patient afebrile. Could be secondary to steroids. Repeat cbc in am. Continue with Levaquin.  8-Chronic Diastolic Heart Failure: Check bnp, ECHO.    Code Status: Full  Family Communication: none at bedside.  Disposition Plan: Remain in step down. Need PT, will probably need SNF.    Consultants:  None  Procedures:  none  Antibiotics:  Levaquin 5-27  HPI/Subjective: She is breathing better. She feels better this morning after going to bathroom.   Objective: Filed Vitals:   08/19/12 0000 08/19/12 0246 08/19/12 0403 08/19/12 0627  BP:  141/74  166/96  Pulse: 87 85  107  Temp: 97.8 F (36.6 C)  97.4 F (36.3 C)   TempSrc: Oral     Resp: 22 22  24   Height:      Weight:   46.5 kg (102 lb 8.2 oz)   SpO2: 96% 94% 93% 93%    Intake/Output Summary (Last 24 hours) at 08/19/12 0758 Last data filed at 08/19/12 0600  Gross per 24 hour  Intake    255 ml  Output   1420 ml  Net  -1165 ml   Filed Weights   08/17/12 1808 08/19/12 0403  Weight: 44.7 kg  (98 lb 8.7 oz) 46.5 kg (102 lb 8.2 oz)    Exam:   General:  No distress.   Cardiovascular: S 1, S 2 RRR  Respiratory: Bilateral ronchus and crackles.   Abdomen: Bs present, soft, NT  Musculoskeletal: no edema  Data Reviewed: Basic Metabolic Panel:  Recent Labs Lab 08/17/12 0942 08/18/12 0342 08/19/12 0335  NA 128* 126* 123*  K 4.0 3.9 3.5  CL 87* 88* 87*  CO2 27 27 27   GLUCOSE 98 148* 138*  BUN 11 14 16   CREATININE 0.58 0.55 0.61  CALCIUM 9.6 9.2 8.9   Liver Function Tests: No results found for this basename: AST, ALT, ALKPHOS, BILITOT, PROT, ALBUMIN,  in the last 168 hours No results found for this basename: LIPASE, AMYLASE,  in the last 168 hours No results found for this basename: AMMONIA,  in the last 168 hours CBC:  Recent Labs Lab 08/17/12 0942 08/18/12 0342 08/19/12 0335  WBC 10.6* 6.9 20.2*  NEUTROABS 8.6*  --   --   HGB 18.3* 16.3* 15.5*  HCT 51.6* 45.8 43.5  MCV 92.3 91.6 90.2  PLT 350 326 368   Cardiac Enzymes:  Recent Labs Lab 08/17/12 0942  TROPONINI <0.30   BNP (last 3 results)  Recent Labs  08/17/12 0942  PROBNP 1142.0*   CBG: No results found for this basename: GLUCAP,  in the last 168 hours  Recent Results (from the past 240 hour(s))  MRSA PCR SCREENING     Status: None   Collection Time  08/17/12  6:23 PM      Result Value Range Status   MRSA by PCR NEGATIVE  NEGATIVE Final   Comment:            The GeneXpert MRSA Assay (FDA     approved for NASAL specimens     only), is one component of a     comprehensive MRSA colonization     surveillance program. It is not     intended to diagnose MRSA     infection nor to guide or     monitor treatment for     MRSA infections.     Studies: Dg Chest 2 View  08/17/2012   *RADIOLOGY REPORT*  Clinical Data: Shortness of breath, cough.  CHEST - 2 VIEW  Comparison: 06/07/2012  Findings: Mild hyperinflation of the lungs.  Heart is upper limits normal in size.  Tortuosity of the  thoracic aorta.  Lungs are clear.  No effusions.  No acute bony abnormality.  Stable compression fractures in the mid and lower thoracic spine.  IMPRESSION: No acute cardiopulmonary disease.   Original Report Authenticated By: Charlett Nose, M.D.   Dg Thoracic Spine 2 View  08/17/2012   *RADIOLOGY REPORT*  Clinical Data: Fall, back pain  THORACIC SPINE - 2 VIEW  Comparison: CT chest dated 06/07/2012  Findings: Exaggerated thoracic kyphosis.  No evidence of acute fracture or dislocation.  Moderate compression deformity at T6 and T10, stable.  Prior vertebral augmentation at T10.  Mild multilevel degenerative changes.  Visualized lungs are clear.  IMPRESSION: No evidence of acute fracture or dislocation.  Prior vertebral augmentation at T6.  Moderate compression deformity at T10, stable.   Original Report Authenticated By: Charline Bills, M.D.   Dg Lumbar Spine Complete  08/17/2012   *RADIOLOGY REPORT*  Clinical Data: Fall, back pain  LUMBAR SPINE - COMPLETE 4+ VIEW  Comparison: 08/20/2010  Findings: Five lumbar-type vertebral bodies.  Exaggerated lumbar lordosis.  No evidence of fracture or dislocation.  Vertebral body heights are maintained.  Mild multilevel degenerative changes.  Grade 1 anterolisthesis of L3 on L4 and L4 on L5, stable.  Visualized bony pelvis appears intact.  Vascular calcifications.  IMPRESSION: No fracture or dislocation is seen.  Mild degenerative changes with stable alignment.   Original Report Authenticated By: Charline Bills, M.D.   Ct Angio Chest Pe W/cm &/or Wo Cm  08/17/2012   *RADIOLOGY REPORT*  Clinical Data: Shortness of breath, cough.  CT ANGIOGRAPHY CHEST  Technique:  Multidetector CT imaging of the chest using the standard protocol during bolus administration of intravenous contrast. Multiplanar reconstructed images including MIPs were obtained and reviewed to evaluate the vascular anatomy.  Contrast: 100 ml Omnipaque 300 IV.  Comparison: 06/07/2012  Findings: No filling  defects in the pulmonary arteries to suggest pulmonary emboli.  There is mucus plugging within the lower lobe bronchi bilaterally.  And right basilar and dependent atelectasis. Lungs otherwise clear.  No pleural effusions.  Heart is mildly enlarged.  Scattered coronary artery calcifications. No mediastinal, hilar, or axillary adenopathy.  Visualized thyroid and chest wall soft tissues unremarkable. Imaging into the upper abdomen shows no acute findings.    Stable multiple old thoracic spine compression fractures and sternal fracture.  IMPRESSION: No evidence of pulmonary embolus.  Mucus plugging in the lower lobe bronchi bilaterally.   Original Report Authenticated By: Charlett Nose, M.D.    Scheduled Meds: . ipratropium  0.5 mg Nebulization Q4H   And  . albuterol  2.5 mg Nebulization  Q4H  . furosemide  20 mg Intravenous Once  . guaiFENesin  1,200 mg Oral BID  . heparin  5,000 Units Subcutaneous Q8H  . levofloxacin  750 mg Oral Daily  . multivitamin with minerals  1 tablet Oral Daily  . potassium chloride  40 mEq Oral Once  . predniSONE  50 mg Oral Q breakfast  . sodium chloride  3 mL Intravenous Q12H   Continuous Infusions:   Active Problems:   Hypoxemic respiratory failure, chronic   Chronic hyponatremia   UTI (lower urinary tract infection)   Back pain   COPD (chronic obstructive pulmonary disease)   HTN (hypertension)    Time spent: 35 minutes.     Luster Hechler  [facility] Pager [phone]. If 7PM-7AM, please contact night-coverage at [email or web address], password [facility] 08/19/2012, 7:58 AM  LOS: 2 days

## 2012-08-19 NOTE — Progress Notes (Signed)
INITIAL NUTRITION ASSESSMENT  DOCUMENTATION CODES Per approved criteria  -Underweight   INTERVENTION: Provide Ensure Complete once daily Encourage PO intake >50% of meals plus snacks  NUTRITION DIAGNOSIS: Inaequate oral intake related to poor appetite as evidenced by pt eating less than 50% of most meals and BMI of 17.   Goal: Pt to meet >/= 90% of their estimated nutrition needs  Monitor:  PO intake Weight Labs  Reason for Assessment: Low BMI  71 y.o. female  Admitting Dx: Back Pain  ASSESSMENT: 72 year old female, with known history of HTN, previous T6 and T10 compression fractures, s/p T10 vertebroplasty, chronic back pain, s/p back surgery, dyslipidemia, anxiety, COPD and chronic hyponatremia, secondary to psychogenic polydipsia, recurrent falls. Patient fell on 08/11/12, and has since been occasionally troubled by back pain, although not incapacitated. She was diagnosed with a UTI a few days ago, and prescribed Nitrofurantoin. According to patient she has become progressively SOB with an occasional cough over the last 3-4 days, without chest pain, fever or chills.   Pt reports that she does not have a big appetite usually but, has been eating less than she normally does since admission. Pt reports eating well PTA with 3 small meals daily. Pt states that she used to weigh 132 lbs many years ago and has been slowly losing weight since but, pt does not know how much she usually weighs now. Pt reports eating approximately 50% of meals today and per nursing notes pt ate 25% of meals yesterday. Encouraged pt to consume >50% of meals and pt made aware that snacks are available upon request.  Height: Ht Readings from Last 1 Encounters:  08/17/12 5\' 4"  (1.626 m)    Weight: Wt Readings from Last 1 Encounters:  08/19/12 102 lb 8.2 oz (46.5 kg)    Ideal Body Weight: 120 lbs  % Ideal Body Weight: 85%  Wt Readings from Last 10 Encounters:  08/19/12 102 lb 8.2 oz (46.5 kg)     Usual Body Weight: unknown  % Usual Body Weight: NA  BMI:  Body mass index is 17.59 kg/(m^2).  Estimated Nutritional Needs: Kcal: 1400-1630 Protein: 55-65 grams Fluid: 1.4-1.9 L  Skin: intact; ecchymosis on back and buttocks with minimal peelin  Diet Order: General  EDUCATION NEEDS: -No education needs identified at this time   Intake/Output Summary (Last 24 hours) at 08/19/12 1613 Last data filed at 08/19/12 0600  Gross per 24 hour  Intake      0 ml  Output   1205 ml  Net  -1205 ml    Last BM: 5/26  Labs:   Recent Labs Lab 08/18/12 0342 08/19/12 0335 08/19/12 1458  NA 126* 123* 124*  K 3.9 3.5 4.0  CL 88* 87* 86*  CO2 27 27 29   BUN 14 16 16   CREATININE 0.55 0.61 0.73  CALCIUM 9.2 8.9 9.7  GLUCOSE 148* 138* 167*    CBG (last 3)  No results found for this basename: GLUCAP,  in the last 72 hours  Scheduled Meds: . ipratropium  0.5 mg Nebulization Q4H   And  . albuterol  2.5 mg Nebulization Q4H  . folic acid  1 mg Oral Daily  . guaiFENesin  1,200 mg Oral BID  . heparin  5,000 Units Subcutaneous Q8H  . levofloxacin  750 mg Oral Daily  . multivitamin with minerals  1 tablet Oral Daily  . predniSONE  50 mg Oral Q breakfast  . sodium chloride  3 mL Intravenous Q12H  .  thiamine  100 mg Oral Daily    Continuous Infusions:   Past Medical History  Diagnosis Date  . Back pain   . Hypertension   . COPD (chronic obstructive pulmonary disease)   . Anxiety   . Hyperlipemia   . Compression fracture     Past Surgical History  Procedure Laterality Date  . Back surgery      Ian Malkin RD, LDN Inpatient Clinical Dietitian Pager: [phone] After Hours Pager: [phone]

## 2012-08-20 LAB — BASIC METABOLIC PANEL
BUN: 18 mg/dL (ref 6–23)
BUN: 20 mg/dL (ref 6–23)
CO2: 29 mEq/L (ref 19–32)
Calcium: 8.8 mg/dL (ref 8.4–10.5)
Creatinine, Ser: 0.88 mg/dL (ref 0.50–1.10)
GFR calc Af Amer: 73 mL/min — ABNORMAL LOW (ref >90)
GFR calc non Af Amer: 63 mL/min — ABNORMAL LOW (ref >90)
Glucose, Bld: 95 mg/dL (ref 70–99)
Sodium: 122 mEq/L — ABNORMAL LOW (ref 135–145)

## 2012-08-20 LAB — CBC
HCT: 43.5 % (ref 36.0–46.0)
Hemoglobin: 14.8 g/dL (ref 12.0–15.0)
MCH: 31.1 pg (ref 26.0–34.0)
MCHC: 34 g/dL (ref 30.0–36.0)
MCV: 91.4 fL (ref 78.0–100.0)
RBC: 4.76 MIL/uL (ref 3.87–5.11)

## 2012-08-20 MED ORDER — LORAZEPAM 2 MG/ML IJ SOLN: 1.0000 mg | Freq: Four times a day (QID) | INTRAMUSCULAR | Status: AC | PRN

## 2012-08-20 MED ORDER — NICOTINE 14 MG/24HR TD PT24
14.0000 mg | MEDICATED_PATCH | Freq: Every day | TRANSDERMAL | Status: DC
  Administered 2012-08-21 – 2012-08-23 (×3): 14 mg via TRANSDERMAL
  Filled 2012-08-20 (×4): qty 1

## 2012-08-20 MED ORDER — METOPROLOL TARTRATE 1 MG/ML IV SOLN
INTRAVENOUS | Status: AC
  Administered 2012-08-20: 5 mg
  Filled 2012-08-20: qty 5

## 2012-08-20 MED ORDER — LORAZEPAM 1 MG PO TABS
1.0000 mg | ORAL_TABLET | Freq: Four times a day (QID) | ORAL | Status: AC | PRN
  Administered 2012-08-20 – 2012-08-22 (×3): 1 mg via ORAL
  Filled 2012-08-20 (×3): qty 1

## 2012-08-20 MED ORDER — METOPROLOL TARTRATE 1 MG/ML IV SOLN: 5.0000 mg | Freq: Once | INTRAVENOUS | Status: AC

## 2012-08-20 MED ORDER — FUROSEMIDE 10 MG/ML IJ SOLN
40.0000 mg | Freq: Once | INTRAMUSCULAR | Status: AC
  Administered 2012-08-20: 40 mg via INTRAVENOUS
  Filled 2012-08-20: qty 4

## 2012-08-20 MED ORDER — LEVOFLOXACIN 250 MG PO TABS
250.0000 mg | ORAL_TABLET | Freq: Every day | ORAL | Status: DC
  Administered 2012-08-20 – 2012-08-22 (×3): 250 mg via ORAL
  Filled 2012-08-20 (×4): qty 1

## 2012-08-20 MED ORDER — POTASSIUM CHLORIDE CRYS ER 20 MEQ PO TBCR
40.0000 meq | EXTENDED_RELEASE_TABLET | Freq: Once | ORAL | Status: AC
  Administered 2012-08-20: 40 meq via ORAL
  Filled 2012-08-20 (×2): qty 2

## 2012-08-20 NOTE — Progress Notes (Signed)
Pt heart rate elevated into 140s and sustaining; pt also very anxious at that time; notified Dr. Sunnie Nielsen, ordered metoprolol IV times 1 dose and prn ativan for anxiety; after metoprolol pts heart rate now in 90s which is baseline and b/p currently 154/88; pt is up in bed reading paper and seems in less distress and anxious at this time; will continue to monitor patient closely

## 2012-08-20 NOTE — Clinical Social Work Placement (Signed)
Clinical Social Work Department CLINICAL SOCIAL WORK PLACEMENT NOTE 08/20/2012  Patient:  [name], [name]  Account Number:  [id number] Admit date:  08/17/2012  Clinical Social Worker:  Jodelle Red  Date/time:  08/20/2012 12:00 N  Clinical Social Work is seeking post-discharge placement for this patient at the following level of care:   SKILLED NURSING   (*CSW will update this form in Epic as items are completed)   08/20/2012  Patient/family provided with [facility] Department of Clinical Social Work's list of facilities offering this level of care within the geographic area requested by the patient (or if unable, by the patient's family).  08/20/2012  Patient/family informed of their freedom to choose among providers that offer the needed level of care, that participate in Medicare, Medicaid or managed care program needed by the patient, have an available bed and are willing to accept the patient.  08/20/2012  Patient/family informed of [facility]' ownership interest in [facility], as well as of the fact that they are under no obligation to receive care at this facility.  PASARR submitted to EDS on 08/20/2012 PASARR number received from [employer] on 08/20/2012  FL2 transmitted to all facilities in geographic area requested by pt/family on   FL2 transmitted to all facilities within larger geographic area on   Patient informed that his/her managed care company has contracts with or will negotiate with  certain facilities, including the following:     Patient/family informed of bed offers received:   Patient chooses bed at  Physician recommends and patient chooses bed at    Patient to be transferred to  on   Patient to be transferred to facility by   The following physician request were entered in Epic:   Additional Comments: Doreen Salvage, LCSW ICU/Stepdown Clinical Social Worker [facility] Cell [phone] Hours 8am-1200pm M-F

## 2012-08-20 NOTE — Progress Notes (Signed)
[facility] PROGRESS NOTE  [name]:[id number] DOB: 05-06-1940 DOA: 08/17/2012 PCP: No primary provider on file.  Assessment/Plan: 1-Acute on chronic Hypoxemic respiratory failure: Secondary to mucus plug. Continue with Levaquin day 4/7, Guaifenesin, flutter valve, albuterol, ipratropium treatments. Continue with prednisone. Mildly increase BNP. Lasix one dose. Respiratory status stable.  2-COPD (chronic obstructive pulmonary disease): continue with albuterol, ipratropium. Prednisone taper.  3-UTI (lower urinary tract infection): UA negative. Was diagnose with UTI outpatient. Levaquin should cover.  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 sodium decrease further. urine osmolality 250. I will repeat dose of lasix. BNP mildly elevated. Water restriction.  5-Back pain, chronic: pain management.  6-HTN (hypertension): DC lisinopril to avoid worsening hyponatremia. Continue with Norvasc.  7-Leukocytosis: patient afebrile. Could be secondary to steroids. WBC trending down. Continue with Levaquin.  8-Chronic Diastolic Heart Failure: Patient does not appears fluid overload on physical exam.  bnp at 624, ECHO with EF 65 %.    Code Status: Full  Family Communication: none at bedside.  Disposition Plan:  need SNF. Transfer to telemetry.    Consultants:  None  Procedures:  none  Antibiotics:  Levaquin 5-27  HPI/Subjective: She is breathing better. Had BM yesterday. She is sleepy. No complaints.   Objective: Filed Vitals:   08/19/12 2246 08/20/12 0000 08/20/12 0010 08/20/12 0400  BP:  170/115 164/98 155/86  Pulse:  80 94 73  Temp: 97.6 F (36.4 C)     TempSrc: Oral     Resp:  15 24 16   Height:      Weight:      SpO2:  99% 97% 99%    Intake/Output Summary (Last 24 hours) at 08/20/12 0743 Last data filed at 08/20/12 0600  Gross per 24 hour  Intake    560 ml  Output   1160 ml  Net   -600 ml   Filed Weights   08/17/12 1808  08/19/12 0403  Weight: 44.7 kg (98 lb 8.7 oz) 46.5 kg (102 lb 8.2 oz)    Exam:   General:  No distress.   Cardiovascular: S 1, S 2 RRR  Respiratory: Bilateral ronchus and crackles.   Abdomen: Bs present, soft, NT  Musculoskeletal: no edema  Data Reviewed: Basic Metabolic Panel:  Recent Labs Lab 08/17/12 0942 08/18/12 0342 08/19/12 0335 08/19/12 1458 08/20/12 0335  NA 128* 126* 123* 124* 122*  K 4.0 3.9 3.5 4.0 3.8  CL 87* 88* 87* 86* 86*  CO2 27 27 27 29 29   GLUCOSE 98 148* 138* 167* 95  BUN 11 14 16 16 18   CREATININE 0.58 0.55 0.61 0.73 0.67  CALCIUM 9.6 9.2 8.9 9.7 8.8   Liver Function Tests: No results found for this basename: AST, ALT, ALKPHOS, BILITOT, PROT, ALBUMIN,  in the last 168 hours No results found for this basename: LIPASE, AMYLASE,  in the last 168 hours No results found for this basename: AMMONIA,  in the last 168 hours CBC:  Recent Labs Lab 08/17/12 0942 08/18/12 0342 08/19/12 0335 08/20/12 0335  WBC 10.6* 6.9 20.2* 17.8*  NEUTROABS 8.6*  --   --   --   HGB 18.3* 16.3* 15.5* 14.8  HCT 51.6* 45.8 43.5 43.5  MCV 92.3 91.6 90.2 91.4  PLT 350 326 368 390   Cardiac Enzymes:  Recent Labs Lab 08/17/12 0942  TROPONINI <0.30   BNP (last 3 results)  Recent Labs  08/17/12 0942 08/19/12 0335  PROBNP 1142.0* 624.2*   CBG: No results found  for this basename: GLUCAP,  in the last 168 hours  Recent Results (from the past 240 hour(s))  MRSA PCR SCREENING     Status: None   Collection Time    08/17/12  6:23 PM      Result Value Range Status   MRSA by PCR NEGATIVE  NEGATIVE Final   Comment:            The GeneXpert MRSA Assay (FDA     approved for NASAL specimens     only), is one component of a     comprehensive MRSA colonization     surveillance program. It is not     intended to diagnose MRSA     infection nor to guide or     monitor treatment for     MRSA infections.     Studies: No results found.  Scheduled Meds: .  ipratropium  0.5 mg Nebulization TID   And  . albuterol  2.5 mg Nebulization TID  . feeding supplement  237 mL Oral Q24H  . folic acid  1 mg Oral Daily  . furosemide  40 mg Intravenous Once  . guaiFENesin  1,200 mg Oral BID  . heparin  5,000 Units Subcutaneous Q8H  . levofloxacin  250 mg Oral Daily  . multivitamin with minerals  1 tablet Oral Daily  . predniSONE  50 mg Oral Q breakfast  . sodium chloride  3 mL Intravenous Q12H  . thiamine  100 mg Oral Daily   Continuous Infusions:   Active Problems:   Hypoxemic respiratory failure, chronic   Chronic hyponatremia   UTI (lower urinary tract infection)   Back pain   COPD (chronic obstructive pulmonary disease)   HTN (hypertension)    Time spent: 25 minutes.     Lecil Tapp  [facility] Pager [phone]. If 7PM-7AM, please contact night-coverage at [email or web address], password [facility] 08/20/2012, 7:43 AM  LOS: 3 days

## 2012-08-20 NOTE — Progress Notes (Signed)
Report called 4 west; pt stable at time of transfer

## 2012-08-20 NOTE — Clinical Social Work Psychosocial (Signed)
Clinical Social Work Department BRIEF PSYCHOSOCIAL ASSESSMENT 08/20/2012  Patient:  GERIANNE, SIMONET     Account Number:  [id number]     Admit date:  08/17/2012  Clinical Social Worker:  Jodelle Red  Date/Time:  08/20/2012 12:06 PM  Referred by:  Physician  Date Referred:  08/19/2012 Referred for  SNF Placement   Other Referral:   Interview type:  Patient Other interview type:   CHART REVIEW, DISCUSSION WITH RN    PSYCHOSOCIAL DATA Living Status:  ALONE Admitted from facility:   Level of care:   Primary support name:  [name] HEWETT Primary support relationship to patient:  FRIEND Degree of support available:   ADEQUATE, HAS CNAS THAT ASSIST AT HOME    CURRENT CONCERNS Current Concerns  Post-Acute Placement   Other Concerns:    SOCIAL WORK ASSESSMENT / PLAN Pt aware and agrees to SNF placement. Reports she has been to [facility] in the past and prefers that SNF. Pt lives alone and has home health aide that assists. CSW provided list and will begin SNF search.   Assessment/plan status:  Psychosocial Support/Ongoing Assessment of Needs Other assessment/ plan:   SNF placement   Information/referral to community resources:   SNF list    PATIENT'S/FAMILY'S RESPONSE TO PLAN OF CARE: Pt interested in SNF, prefers [facility]. CSW will prepare FL2 and fax out for SNF.       Doreen Salvage, LCSW ICU/Stepdown Clinical Social Worker [facility] Cell [phone] Hours 8am-1200pm M-F

## 2012-08-20 NOTE — Progress Notes (Signed)
Occupational Therapy Treatment Patient Details Name: [name] MRN: [id number] DOB: 20-Feb-1941 Today's Date: 08/20/2012 Time: 2841-3244 OT Time Calculation (min): 32 min  OT Assessment / Plan / Recommendation Comments on Treatment Session Pt participated in OT but c/o fatique.  Needed min to mod A to stand. Pt states her plan is to go home, with home instead.  She would need 24/7 assistance     Follow Up Recommendations  SNF (unless pt has 24/7)    Barriers to Discharge       Equipment Recommendations  3 in 1 bedside comode    Recommendations for Other Services    Frequency     Plan      Precautions / Restrictions Precautions Precautions: Fall Restrictions Weight Bearing Restrictions: No   Pertinent Vitals/Pain No c/o pain    ADL  Toilet Transfer: Moderate assistance Toilet Transfer Method: Stand pivot Toilet Transfer Equipment: Bedside commode Toileting - Clothing Manipulation and Hygiene: Maximal assistance Where Assessed - Toileting Clothing Manipulation and Hygiene: Sit to stand from 3-in-1 or toilet Transfers/Ambulation Related to ADLs: initially sat eob x 3 minutes and pt laid back down quickly stating that she was tired.  Later, transferred to 3:1 as pt had toileting needs.   ADL Comments: Pt had difficulty performing toilet hygiene--difficulty reaching in sitting and unable to maintain balance and wipe in standing.  Provided theraband (orange) and reviewed horizontal abduction as well as flexion.  Performed 1 set of 10--only did flexion with RUE then complained of fatique.  Encouraged her to start performing as much of UB adls as she can to increase strength and endurance.    OT Diagnosis:    OT Problem List:   OT Treatment Interventions:     OT Goals Acute Rehab OT Goals Time For Goal Achievement: 09/01/12 ADL Goals Pt Will Transfer to Toilet: with supervision;Ambulation;3-in-1;with DME ADL Goal: Toilet Transfer - Progress: Progressing toward goals Pt Will  Perform Toileting - Hygiene: with supervision;Sit to stand from 3-in-1/toilet ADL Goal: Toileting - Hygiene - Progress: Not progressing Arm Goals Pt Will Complete Theraband Exer: with supervision, verbal cues required/provided;2 sets;Level 2 Theraband;Bilateral upper extremities;to increase strength Arm Goal: Theraband Exercises - Progress: Progressing toward goal  Visit Information  Last OT Received On: 08/20/12 Assistance Needed: +1    Subjective Data      Prior Functioning       Cognition  Cognition Arousal/Alertness: Awake/alert Behavior During Therapy: WFL for tasks assessed/performed Overall Cognitive Status: Within Functional Limits for tasks assessed (did repeat questions a couple of times) Seemed wfls when talking to family member on the phone   Mobility  Bed Mobility Bed Mobility: Supine to Sit;Sit to Supine Rolling Left: 4: Min assist Supine to Sit: 4: Min assist;With rails Sit to Supine: 4: Min assist Transfers Sit to Stand: 4: Min assist;3: Mod assist (mod from bed; min from 3:1 commode) Stand to Sit: 4: Min guard;To chair/3-in-1;To bed Details for Transfer Assistance: cues for safety    Exercises  General Exercises - Upper Extremity Shoulder Flexion: 10 reps;Right;Strengthening Shoulder ADduction: Both;10 reps;Strengthening (horizontal abduction)   Balance     End of Session OT - End of Session Activity Tolerance: Patient limited by fatigue Patient left: in bed;with bed alarm set;with call bell/phone within reach  GO     Tyreek Clabo 08/20/2012, 4:09 PM Marica Otter, OTR/L [phone] 08/20/2012

## 2012-08-21 LAB — BASIC METABOLIC PANEL
BUN: 17 mg/dL (ref 6–23)
Calcium: 9.1 mg/dL (ref 8.4–10.5)
GFR calc Af Amer: >90 mL/min (ref >90)
GFR calc non Af Amer: 83 mL/min — ABNORMAL LOW (ref >90)
Potassium: 4.3 mEq/L (ref 3.5–5.1)
Sodium: 127 mEq/L — ABNORMAL LOW (ref 135–145)

## 2012-08-21 LAB — CBC
MCHC: 34.3 g/dL (ref 30.0–36.0)
RDW: 12.9 % (ref 11.5–15.5)

## 2012-08-21 LAB — T3, FREE: T3, Free: 2.3 pg/mL (ref 2.3–4.2)

## 2012-08-21 LAB — TSH: TSH: 0.574 u[IU]/mL (ref 0.350–4.500)

## 2012-08-21 MED ORDER — AMLODIPINE BESYLATE 5 MG PO TABS
5.0000 mg | ORAL_TABLET | Freq: Every day | ORAL | Status: DC
  Administered 2012-08-21 – 2012-08-23 (×3): 5 mg via ORAL
  Filled 2012-08-21 (×3): qty 1

## 2012-08-21 NOTE — Progress Notes (Signed)
Provided Pt with bed offers.  Weekday to f/u with any new offers on Monday.  Providence Crosby, LCSWA Clinical Social Work [phone]

## 2012-08-21 NOTE — Progress Notes (Signed)
[facility] PROGRESS NOTE  [name] NFA:[id number] DOB: 1940/10/16 DOA: 08/17/2012 PCP: No primary provider on file.  Assessment/Plan: 1-Acute on chronic Hypoxemic respiratory failure: Secondary to mucus plug. Continue with Levaquin day 5/7, Guaifenesin, flutter valve, albuterol, ipratropium treatments. Continue with prednisone. Mildly increase BNP. Received 2 doses of lasix. Respiratory status improved.   2-COPD (chronic obstructive pulmonary disease): continue with albuterol, ipratropium. Prednisone taper.  3-UTI (lower urinary tract infection): UA negative. Was diagnose with UTI outpatient. Levaquin should cover.  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 sodium decrease further. urine osmolality 250.  BNP mildly elevated. Water restriction. Sodium increase to 127 with IV lasix. Patient sodium baseline around 128. Repeat B-met in am.  5-Back pain, chronic: pain management.  6-HTN (hypertension): DC lisinopril to avoid worsening hyponatremia. Will add Norvasc.  7-Leukocytosis: patient afebrile. Could be secondary to steroids. WBC trending down. Continue with Levaquin.  8-Chronic Diastolic Heart Failure: Patient does not appears fluid overload on physical exam.  bnp at 624, ECHO with EF 65 %.    Code Status: Full  Family Communication: none at bedside.  Disposition Plan:  SNF when bed available.    Consultants:  None  Procedures:  none  Antibiotics:  Levaquin 5-27  HPI/Subjective: She is breathing better. No complaints or concern.   Objective: Filed Vitals:   08/20/12 2100 08/20/12 2159 08/21/12 0548 08/21/12 1000  BP:  140/81 150/84   Pulse:  82 71   Temp:  97.6 F (36.4 C) 97.8 F (36.6 C)   TempSrc:  Oral Oral   Resp:  22 20   Height:      Weight:      SpO2: 94% 98% 98% 97%    Intake/Output Summary (Last 24 hours) at 08/21/12 1234 Last data filed at 08/21/12 0900  Gross per 24 hour  Intake    480 ml  Output    450  ml  Net     30 ml   Filed Weights   08/17/12 1808 08/19/12 0403 08/20/12 1313  Weight: 44.7 kg (98 lb 8.7 oz) 46.5 kg (102 lb 8.2 oz) 46.857 kg (103 lb 4.8 oz)    Exam:   General:  No distress.   Cardiovascular: S 1, S 2 RRR  Respiratory: Bilateral ronchus and crackles.   Abdomen: Bs present, soft, NT  Musculoskeletal: no edema  Data Reviewed: Basic Metabolic Panel:  Recent Labs Lab 08/19/12 0335 08/19/12 1458 08/20/12 0335 08/20/12 1357 08/21/12 0540  NA 123* 124* 122* 126* 127*  K 3.5 4.0 3.8 3.8 4.3  CL 87* 86* 86* 84* 89*  CO2 27 29 29 31  34*  GLUCOSE 138* 167* 95 123* 88  BUN 16 16 18 20 17   CREATININE 0.61 0.73 0.67 0.88 0.70  CALCIUM 8.9 9.7 8.8 9.2 9.1   Liver Function Tests: No results found for this basename: AST, ALT, ALKPHOS, BILITOT, PROT, ALBUMIN,  in the last 168 hours No results found for this basename: LIPASE, AMYLASE,  in the last 168 hours No results found for this basename: AMMONIA,  in the last 168 hours CBC:  Recent Labs Lab 08/17/12 0942 08/18/12 0342 08/19/12 0335 08/20/12 0335 08/21/12 0540  WBC 10.6* 6.9 20.2* 17.8* 14.6*  NEUTROABS 8.6*  --   --   --   --   HGB 18.3* 16.3* 15.5* 14.8 15.9*  HCT 51.6* 45.8 43.5 43.5 46.3*  MCV 92.3 91.6 90.2 91.4 92.8  PLT 350 326 368 390 359   Cardiac Enzymes:  Recent Labs Lab 08/17/12 0942  TROPONINI <0.30   BNP (last 3 results)  Recent Labs  08/17/12 0942 08/19/12 0335  PROBNP 1142.0* 624.2*   CBG: No results found for this basename: GLUCAP,  in the last 168 hours  Recent Results (from the past 240 hour(s))  MRSA PCR SCREENING     Status: None   Collection Time    08/17/12  6:23 PM      Result Value Range Status   MRSA by PCR NEGATIVE  NEGATIVE Final   Comment:            The GeneXpert MRSA Assay (FDA     approved for NASAL specimens     only), is one component of a     comprehensive MRSA colonization     surveillance program. It is not     intended to diagnose MRSA      infection nor to guide or     monitor treatment for     MRSA infections.     Studies: No results found.  Scheduled Meds: . ipratropium  0.5 mg Nebulization TID   And  . albuterol  2.5 mg Nebulization TID  . feeding supplement  237 mL Oral Q24H  . folic acid  1 mg Oral Daily  . guaiFENesin  1,200 mg Oral BID  . heparin  5,000 Units Subcutaneous Q8H  . levofloxacin  250 mg Oral Daily  . multivitamin with minerals  1 tablet Oral Daily  . nicotine  14 mg Transdermal Daily  . predniSONE  50 mg Oral Q breakfast  . thiamine  100 mg Oral Daily   Continuous Infusions:   Active Problems:   Hypoxemic respiratory failure, chronic   Chronic hyponatremia   UTI (lower urinary tract infection)   Back pain   COPD (chronic obstructive pulmonary disease)   HTN (hypertension)    Time spent: 25 minutes.     [name]  [facility] Pager [phone]. If 7PM-7AM, please contact night-coverage at [email or web address], password [facility] 08/21/2012, 12:34 PM  LOS: 4 days

## 2012-08-22 LAB — BASIC METABOLIC PANEL
BUN: 15 mg/dL (ref 6–23)
CO2: 32 mEq/L (ref 19–32)
Calcium: 8.9 mg/dL (ref 8.4–10.5)
Chloride: 90 mEq/L — ABNORMAL LOW (ref 96–112)
Creatinine, Ser: 0.57 mg/dL (ref 0.50–1.10)
GFR calc Af Amer: >90 mL/min (ref >90)
GFR calc non Af Amer: 89 mL/min — ABNORMAL LOW (ref >90)
Glucose, Bld: 84 mg/dL (ref 70–99)
Potassium: 3.8 mEq/L (ref 3.5–5.1)
Sodium: 126 mEq/L — ABNORMAL LOW (ref 135–145)

## 2012-08-22 LAB — CBC
Hemoglobin: 15.8 g/dL — ABNORMAL HIGH (ref 12.0–15.0)
MCHC: 33.9 g/dL (ref 30.0–36.0)
Platelets: 367 10*3/uL (ref 150–400)
RBC: 5.03 MIL/uL (ref 3.87–5.11)

## 2012-08-22 MED ORDER — FUROSEMIDE 10 MG/ML IJ SOLN
40.0000 mg | Freq: Once | INTRAMUSCULAR | Status: AC
  Administered 2012-08-22: 40 mg via INTRAVENOUS
  Filled 2012-08-22: qty 4

## 2012-08-22 MED ORDER — IPRATROPIUM BROMIDE 0.02 % IN SOLN: 0.5000 mg | Freq: Four times a day (QID) | RESPIRATORY_TRACT | Status: DC | PRN

## 2012-08-22 MED ORDER — PREDNISONE 20 MG PO TABS
40.0000 mg | ORAL_TABLET | Freq: Every day | ORAL | Status: DC
  Administered 2012-08-23: 40 mg via ORAL
  Filled 2012-08-22 (×2): qty 2

## 2012-08-22 MED ORDER — ALBUTEROL SULFATE (5 MG/ML) 0.5% IN NEBU: 2.5000 mg | INHALATION_SOLUTION | Freq: Three times a day (TID) | RESPIRATORY_TRACT | Status: DC

## 2012-08-22 NOTE — Progress Notes (Signed)
[facility] PROGRESS NOTE  [name] ZOX:[id number] DOB: [birth year] DOA: 08/17/2012 PCP: No primary provider on file.  Assessment/Plan: 1-Acute on chronic Hypoxemic respiratory failure: Secondary to mucus plug. Continue with Levaquin day 6/7, Guaifenesin, flutter valve, albuterol, ipratropium treatments. Continue with prednisone. Mildly increase BNP. Received 2 doses of lasix. Respiratory status improved.   2-COPD (chronic obstructive pulmonary disease): continue with albuterol, ipratropium. Prednisone taper.  3-UTI (lower urinary tract infection): UA negative. Was diagnose with UTI outpatient. Levaquin should cover.  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 sodium decrease further. urine osmolality 250.  BNP mildly elevated. Water restriction. Sodium increase to 127 with IV lasix. Patient sodium baseline around 128. Repeat lasix dose today. If sodium stable tomorrow will plan to discharge to SNF.  5-Back pain, chronic: pain management.  6-HTN (hypertension): DC lisinopril to avoid worsening hyponatremia. Continue with  Norvasc.  7-Leukocytosis: patient afebrile. Could be secondary to steroids. WBC trending down. Continue with Levaquin.  8-Chronic Diastolic Heart Failure: Patient does not appears fluid overload on physical exam.  bnp at 624, ECHO with EF 65 %.    Code Status: Full  Family Communication: none at bedside.  Disposition Plan:  SNF when bed available.    Consultants:  None  Procedures:  none  Antibiotics:  Levaquin 5-27  HPI/Subjective: She is breathing better. No complaints or concern.   Objective: Filed Vitals:   08/21/12 2136 08/22/12 0500 08/22/12 0539 08/22/12 0544  BP: 148/86  167/96 170/88  Pulse: 118  97 85  Temp: 98.2 F (36.8 C)  97.6 F (36.4 C)   TempSrc: Oral  Oral   Resp: 20  18   Height:      Weight:  46.72 kg (103 lb)    SpO2: 98%  99%     Intake/Output Summary (Last 24 hours) at 08/22/12  1351 Last data filed at 08/22/12 1100  Gross per 24 hour  Intake    200 ml  Output     50 ml  Net    150 ml   Filed Weights   08/19/12 0403 08/20/12 1313 08/22/12 0500  Weight: 46.5 kg (102 lb 8.2 oz) 46.857 kg (103 lb 4.8 oz) 46.72 kg (103 lb)    Exam:   General:  No distress.   Cardiovascular: S 1, S 2 RRR  Respiratory: Bilateral ronchus and crackles.   Abdomen: Bs present, soft, NT  Musculoskeletal: no edema  Data Reviewed: Basic Metabolic Panel:  Recent Labs Lab 08/19/12 1458 08/20/12 0335 08/20/12 1357 08/21/12 0540 08/22/12 0532  NA 124* 122* 126* 127* 126*  K 4.0 3.8 3.8 4.3 3.8  CL 86* 86* 84* 89* 90*  CO2 29 29 31  34* 32  GLUCOSE 167* 95 123* 88 84  BUN 16 18 20 17 15   CREATININE 0.73 0.67 0.88 0.70 0.57  CALCIUM 9.7 8.8 9.2 9.1 8.9   Liver Function Tests: No results found for this basename: AST, ALT, ALKPHOS, BILITOT, PROT, ALBUMIN,  in the last 168 hours No results found for this basename: LIPASE, AMYLASE,  in the last 168 hours No results found for this basename: AMMONIA,  in the last 168 hours CBC:  Recent Labs Lab 08/17/12 0942 08/18/12 0342 08/19/12 0335 08/20/12 0335 08/21/12 0540 08/22/12 0532  WBC 10.6* 6.9 20.2* 17.8* 14.6* 15.1*  NEUTROABS 8.6*  --   --   --   --   --   HGB 18.3* 16.3* 15.5* 14.8 15.9* 15.8*  HCT 51.6* 45.8 43.5 43.5  46.3* 46.6*  MCV 92.3 91.6 90.2 91.4 92.8 92.6  PLT 350 326 368 390 359 367   Cardiac Enzymes:  Recent Labs Lab 08/17/12 0942  TROPONINI <0.30   BNP (last 3 results)  Recent Labs  08/17/12 0942 08/19/12 0335  PROBNP 1142.0* 624.2*   CBG: No results found for this basename: GLUCAP,  in the last 168 hours  Recent Results (from the past 240 hour(s))  MRSA PCR SCREENING     Status: None   Collection Time    08/17/12  6:23 PM      Result Value Range Status   MRSA by PCR NEGATIVE  NEGATIVE Final   Comment:            The GeneXpert MRSA Assay (FDA     approved for NASAL specimens      only), is one component of a     comprehensive MRSA colonization     surveillance program. It is not     intended to diagnose MRSA     infection nor to guide or     monitor treatment for     MRSA infections.     Studies: No results found.  Scheduled Meds: . ipratropium  0.5 mg Nebulization TID   And  . albuterol  2.5 mg Nebulization TID  . amLODipine  5 mg Oral Daily  . feeding supplement  237 mL Oral Q24H  . folic acid  1 mg Oral Daily  . furosemide  40 mg Intravenous Once  . guaiFENesin  1,200 mg Oral BID  . heparin  5,000 Units Subcutaneous Q8H  . levofloxacin  250 mg Oral Daily  . multivitamin with minerals  1 tablet Oral Daily  . nicotine  14 mg Transdermal Daily  . predniSONE  50 mg Oral Q breakfast  . thiamine  100 mg Oral Daily   Continuous Infusions:   Active Problems:   Hypoxemic respiratory failure, chronic   Chronic hyponatremia   UTI (lower urinary tract infection)   Back pain   COPD (chronic obstructive pulmonary disease)   HTN (hypertension)    Time spent: 25 minutes.     [name]  [facility] Pager [phone]. If 7PM-7AM, please contact night-coverage at [email or web address], password [facility] 08/22/2012, 1:51 PM  LOS: 5 days

## 2012-08-23 LAB — CBC
Hemoglobin: 16.6 g/dL — ABNORMAL HIGH (ref 12.0–15.0)
MCH: 31.4 pg (ref 26.0–34.0)
Platelets: 388 10*3/uL (ref 150–400)
RBC: 5.29 MIL/uL — ABNORMAL HIGH (ref 3.87–5.11)
WBC: 15.7 10*3/uL — ABNORMAL HIGH (ref 4.0–10.5)

## 2012-08-23 LAB — BASIC METABOLIC PANEL
BUN: 17 mg/dL (ref 6–23)
Chloride: 89 mEq/L — ABNORMAL LOW (ref 96–112)
Creatinine, Ser: 0.61 mg/dL (ref 0.50–1.10)
GFR calc Af Amer: >90 mL/min (ref >90)
GFR calc non Af Amer: 87 mL/min — ABNORMAL LOW (ref >90)
Glucose, Bld: 111 mg/dL — ABNORMAL HIGH (ref 70–99)
Potassium: 3.6 mEq/L (ref 3.5–5.1)

## 2012-08-23 MED ORDER — NICOTINE 14 MG/24HR TD PT24: 1.0000 | MEDICATED_PATCH | Freq: Every day | TRANSDERMAL | Status: DC

## 2012-08-23 MED ORDER — IPRATROPIUM BROMIDE 0.02 % IN SOLN: 0.5000 mg | Freq: Four times a day (QID) | RESPIRATORY_TRACT | Status: DC | PRN

## 2012-08-23 MED ORDER — GUAIFENESIN ER 600 MG PO TB12: 1200.0000 mg | ORAL_TABLET | Freq: Two times a day (BID) | ORAL | Status: DC

## 2012-08-23 MED ORDER — ALBUTEROL SULFATE (5 MG/ML) 0.5% IN NEBU: 2.5000 mg | INHALATION_SOLUTION | Freq: Four times a day (QID) | RESPIRATORY_TRACT | Status: DC | PRN

## 2012-08-23 MED ORDER — PREDNISONE 20 MG PO TABS: 40.0000 mg | ORAL_TABLET | Freq: Every day | ORAL | Status: DC

## 2012-08-23 MED ORDER — AMLODIPINE BESYLATE 5 MG PO TABS: 5.0000 mg | ORAL_TABLET | Freq: Every day | ORAL | Status: DC

## 2012-08-23 NOTE — Progress Notes (Signed)
08/23/2012 Colleen Can BSN RN CCM [phone] Concurrent review completed. Plans are Snf placemnt with anticipation of discharge today.

## 2012-08-23 NOTE — Progress Notes (Addendum)
Patient is set to discharge to [facility] today. Patient & POA, [name] aware. Discharge packet in [place]. [facility] called for 3:15 pickup.   Clinical Social Work Department CLINICAL SOCIAL WORK PLACEMENT NOTE 08/23/2012  Patient:  [name], [name]  Account Number:  [id number] Admit date:  08/17/2012  Clinical Social Worker:  Jodelle Red  Date/time:  08/20/2012 12:00 N  Clinical Social Work is seeking post-discharge placement for this patient at the following level of care:   SKILLED NURSING   (*CSW will update this form in Epic as items are completed)   08/20/2012  Patient/family provided with [facility] Department of Clinical Social Work's list of facilities offering this level of care within the geographic area requested by the patient (or if unable, by the patient's family).  08/20/2012  Patient/family informed of their freedom to choose among providers that offer the needed level of care, that participate in Medicare, Medicaid or managed care program needed by the patient, have an available bed and are willing to accept the patient.  08/20/2012  Patient/family informed of [facility]' ownership interest in [facility], as well as of the fact that they are under no obligation to receive care at this facility.  PASARR submitted to EDS on 08/20/2012 PASARR number received from [employer] on 08/20/2012  FL2 transmitted to all facilities in geographic area requested by pt/family on   FL2 transmitted to all facilities within larger geographic area on   Patient informed that his/her managed care company has contracts with or will negotiate with  certain facilities, including the following:     Patient/family informed of bed offers received:  08/23/2012 Patient chooses bed at [facility] Physician recommends and patient chooses bed at    Patient to be transferred to [facility] on  08/23/2012 Patient to be transferred to facility by [facility]  The following physician  request were entered in Epic:   Additional Comments:   Unice Bailey, LCSW [facility] Clinical Social Worker cell #: [phone]

## 2012-08-23 NOTE — Discharge Summary (Signed)
Physician Discharge Summary  [name] ZOX:[id number] DOB: 01/28/1941 DOA: 08/17/2012  PCP: No primary provider on file.  Admit date: 08/17/2012 Discharge date: 08/23/2012  Time spent: 35 minutes  Recommendations for Outpatient Follow-up:  1. Need frequent B-met to follow up sodium level, water restriction diet. Could consider lasix for hyponatremia.   Discharge Diagnoses:    Hypoxemic respiratory failure, Acute   Chronic hyponatremia   UTI (lower urinary tract infection)   Back pain   COPD (chronic obstructive pulmonary disease)   HTN (hypertension)   Discharge Condition: Stable  Diet recommendation: Regular diet  Filed Weights   08/20/12 1313 08/22/12 0500 08/23/12 0500  Weight: 46.857 kg (103 lb 4.8 oz) 46.72 kg (103 lb) 46.085 kg (101 lb 9.6 oz)    History of present illness:  This is a 72 year old female, with known history of HTN, previous T6 and T10 compression fractures, s/p T10 vertebroplasty, chronic back pain, s/p back surgery, dyslipidemia, anxiety, COPD and chronic hyponatremia, secondary to psychogenic polydipsia, recurrent falls. Patient fell on 08/11/12, and has since been occasionally troubled by back pain, although not incapacitated. She was diagnosed with a UTI a few days ago, and prescribed Nitrofurantoin. According to patient she has become progressively SOB with an occasional cough over the last 3-4 days, without chest pain, fever or chills. Her care-giver got concerned today, and called 911. In the ED, ABG demonstrated PH 7.513, PCO2 33.9, PO2 56.1, and Bicarb 27.1.    Hospital Course:  1-Acute on chronic Hypoxemic respiratory failure: Patient admitted with hypoxemia, respiratory distress. Secondary to mucus plug. Continue with Levaquin day 7/7, Guaifenesin, flutter valve, albuterol, ipratropium treatments. Continue with prednisone. Mildly increase BNP. Received 2 doses of lasix. Respiratory status improved. Patient stable to be transfer to SNF.   2-COPD  (chronic obstructive pulmonary disease): continue with albuterol, ipratropium. Prednisone taper.   3-UTI (lower urinary tract infection): UA negative. Was diagnose with UTI outpatient. Levaquin should cover.   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 sodium decrease further. urine osmolality 250. BNP mildly elevated. Water restriction. Sodium increase to 127 with IV lasix. Patient sodium baseline around 128.   5-Back pain, chronic: pain management.   6-HTN (hypertension): DC lisinopril to avoid worsening hyponatremia. Continue with Norvasc.   7-Leukocytosis: patient afebrile. Could be secondary to steroids. WBC trending down. Continue with Levaquin.   8-Chronic Diastolic Heart Failure: Patient does not appears fluid overload on physical exam. bnp at 624, ECHO with EF 65 %.    Procedures:  none  Consultations:  none  Discharge Exam: Filed Vitals:   08/23/12 0500 08/23/12 0502 08/23/12 0645 08/23/12 1004  BP:  165/109 150/84 145/84  Pulse:  106    Temp:  98.3 F (36.8 C)    TempSrc:  Oral    Resp:  22    Height:      Weight: 46.085 kg (101 lb 9.6 oz)     SpO2:  92%      General: No distress. Cardiovascular: S 1, S 2 RRR Respiratory: Crackles bases.   Discharge Instructions  Discharge Orders   Future Orders Complete By Expires     Increase activity slowly  As directed         Medication List    STOP taking these medications       lisinopril 40 MG tablet  Commonly known as:  PRINIVIL,ZESTRIL     nitrofurantoin 100 MG capsule  Commonly known as:  MACRODANTIN  TAKE these medications       albuterol (5 MG/ML) 0.5% nebulizer solution  Commonly known as:  PROVENTIL  Take 0.5 mLs (2.5 mg total) by nebulization every 6 (six) hours as needed for wheezing.     amLODipine 5 MG tablet  Commonly known as:  NORVASC  Take 1 tablet (5 mg total) by mouth daily.     cholecalciferol 1000 UNITS tablet  Commonly known as:  VITAMIN D   Take 1,000 Units by mouth daily.     guaiFENesin 600 MG 12 hr tablet  Commonly known as:  MUCINEX  Take 2 tablets (1,200 mg total) by mouth 2 (two) times daily.     ipratropium 0.02 % nebulizer solution  Commonly known as:  ATROVENT  Take 2.5 mLs (0.5 mg total) by nebulization every 6 (six) hours as needed.     multivitamin with minerals tablet  Take 1 tablet by mouth daily.     nicotine 14 mg/24hr patch  Commonly known as:  NICODERM CQ - dosed in mg/24 hours  Place 1 patch onto the skin daily.     predniSONE 20 MG tablet  Commonly known as:  DELTASONE  Take 2 tablets (40 mg total) by mouth daily with breakfast.     traMADol 50 MG tablet  Commonly known as:  ULTRAM  Take 50 mg by mouth every 6 (six) hours as needed (pain).       No Known Allergies    The results of significant diagnostics from this hospitalization (including imaging, microbiology, ancillary and laboratory) are listed below for reference.    Significant Diagnostic Studies: Dg Chest 2 View  08/17/2012   *RADIOLOGY REPORT*  Clinical Data: Shortness of breath, cough.  CHEST - 2 VIEW  Comparison: 06/07/2012  Findings: Mild hyperinflation of the lungs.  Heart is upper limits normal in size.  Tortuosity of the thoracic aorta.  Lungs are clear.  No effusions.  No acute bony abnormality.  Stable compression fractures in the mid and lower thoracic spine.  IMPRESSION: No acute cardiopulmonary disease.   Original Report Authenticated By: Charlett Nose, M.D.   Dg Thoracic Spine 2 View  08/17/2012   *RADIOLOGY REPORT*  Clinical Data: Fall, back pain  THORACIC SPINE - 2 VIEW  Comparison: CT chest dated 06/07/2012  Findings: Exaggerated thoracic kyphosis.  No evidence of acute fracture or dislocation.  Moderate compression deformity at T6 and T10, stable.  Prior vertebral augmentation at T10.  Mild multilevel degenerative changes.  Visualized lungs are clear.  IMPRESSION: No evidence of acute fracture or dislocation.  Prior  vertebral augmentation at T6.  Moderate compression deformity at T10, stable.   Original Report Authenticated By: Charline Bills, M.D.   Dg Lumbar Spine Complete  08/17/2012   *RADIOLOGY REPORT*  Clinical Data: Fall, back pain  LUMBAR SPINE - COMPLETE 4+ VIEW  Comparison: 08/20/2010  Findings: Five lumbar-type vertebral bodies.  Exaggerated lumbar lordosis.  No evidence of fracture or dislocation.  Vertebral body heights are maintained.  Mild multilevel degenerative changes.  Grade 1 anterolisthesis of L3 on L4 and L4 on L5, stable.  Visualized bony pelvis appears intact.  Vascular calcifications.  IMPRESSION: No fracture or dislocation is seen.  Mild degenerative changes with stable alignment.   Original Report Authenticated By: Charline Bills, M.D.   Ct Angio Chest Pe W/cm &/or Wo Cm  08/17/2012   *RADIOLOGY REPORT*  Clinical Data: Shortness of breath, cough.  CT ANGIOGRAPHY CHEST  Technique:  Multidetector CT imaging of the chest  using the standard protocol during bolus administration of intravenous contrast. Multiplanar reconstructed images including MIPs were obtained and reviewed to evaluate the vascular anatomy.  Contrast: 100 ml Omnipaque 300 IV.  Comparison: 06/07/2012  Findings: No filling defects in the pulmonary arteries to suggest pulmonary emboli.  There is mucus plugging within the lower lobe bronchi bilaterally.  And right basilar and dependent atelectasis. Lungs otherwise clear.  No pleural effusions.  Heart is mildly enlarged.  Scattered coronary artery calcifications. No mediastinal, hilar, or axillary adenopathy.  Visualized thyroid and chest wall soft tissues unremarkable. Imaging into the upper abdomen shows no acute findings.    Stable multiple old thoracic spine compression fractures and sternal fracture.  IMPRESSION: No evidence of pulmonary embolus.  Mucus plugging in the lower lobe bronchi bilaterally.   Original Report Authenticated By: Charlett Nose, M.D.     Microbiology: Recent Results (from the past 240 hour(s))  MRSA PCR SCREENING     Status: None   Collection Time    08/17/12  6:23 PM      Result Value Range Status   MRSA by PCR NEGATIVE  NEGATIVE Final   Comment:            The GeneXpert MRSA Assay (FDA     approved for NASAL specimens     only), is one component of a     comprehensive MRSA colonization     surveillance program. It is not     intended to diagnose MRSA     infection nor to guide or     monitor treatment for     MRSA infections.     Labs: Basic Metabolic Panel:  Recent Labs Lab 08/20/12 0335 08/20/12 1357 08/21/12 0540 08/22/12 0532 08/23/12 0453  NA 122* 126* 127* 126* 127*  K 3.8 3.8 4.3 3.8 3.6  CL 86* 84* 89* 90* 89*  CO2 29 31 34* 32 31  GLUCOSE 95 123* 88 84 111*  BUN 18 20 17 15 17   CREATININE 0.67 0.88 0.70 0.57 0.61  CALCIUM 8.8 9.2 9.1 8.9 9.0   Liver Function Tests: No results found for this basename: AST, ALT, ALKPHOS, BILITOT, PROT, ALBUMIN,  in the last 168 hours No results found for this basename: LIPASE, AMYLASE,  in the last 168 hours No results found for this basename: AMMONIA,  in the last 168 hours CBC:  Recent Labs Lab 08/17/12 0942  08/19/12 0335 08/20/12 0335 08/21/12 0540 08/22/12 0532 08/23/12 0453  WBC 10.6*  < > 20.2* 17.8* 14.6* 15.1* 15.7*  NEUTROABS 8.6*  --   --   --   --   --   --   HGB 18.3*  < > 15.5* 14.8 15.9* 15.8* 16.6*  HCT 51.6*  < > 43.5 43.5 46.3* 46.6* 49.0*  MCV 92.3  < > 90.2 91.4 92.8 92.6 92.6  PLT 350  < > 368 390 359 367 388  < > = values in this interval not displayed. Cardiac Enzymes:  Recent Labs Lab 08/17/12 0942  TROPONINI <0.30   BNP: BNP (last 3 results)  Recent Labs  08/17/12 0942 08/19/12 0335  PROBNP 1142.0* 624.2*   CBG: No results found for this basename: GLUCAP,  in the last 168 hours     Signed:  REGALADO,BELKYS  [facility] 08/23/2012, 12:45 PM

## 2012-08-23 NOTE — Progress Notes (Signed)
Occupational Therapy Treatment Patient Details Name: [name] MRN: [id number] DOB: 1941/02/21 Today's Date: 08/23/2012 Time: 4098-1191 OT Time Calculation (min): 27 min  OT Assessment / Plan / Recommendation                Frequency     Plan Discharge plan remains appropriate    Precautions / Restrictions Precautions Precautions: Fall Restrictions Weight Bearing Restrictions: No       ADL  Grooming: Performed;Wash/dry face;Set up Where Assessed - Grooming: Supported sitting Upper Body Dressing: Performed;Minimal assistance Where Assessed - Upper Body Dressing: Unsupported sitting Toilet Transfer: Performed;Moderate assistance Toilet Transfer Method: Sit to stand;Stand pivot Toilet Transfer Equipment: Bedside commode Toileting - Clothing Manipulation and Hygiene: Performed;Moderate assistance Where Assessed - Toileting Clothing Manipulation and Hygiene: Standing Transfers/Ambulation Related to ADLs: Pt needed max encouragement      OT Goals ADL Goals ADL Goal: Grooming - Progress: Progressing toward goals ADL Goal: Upper Body Dressing - Progress: Progressing toward goals ADL Goal: Toilet Transfer - Progress: Progressing toward goals ADL Goal: Toileting - Clothing Manipulation - Progress: Progressing toward goals ADL Goal: Toileting - Hygiene - Progress: Progressing toward goals  Visit Information  Last OT Received On: 08/23/12 Assistance Needed: +1    Subjective Data  Subjective: I want the bed pan. (with encouragement pt did use BSC)      Cognition  Cognition Arousal/Alertness: Awake/alert Behavior During Therapy: WFL for tasks assessed/performed Overall Cognitive Status: Within Functional Limits for tasks assessed (did repeat questions a couple of times)    Mobility  Bed Mobility Bed Mobility: Supine to Sit;Sit to Supine Rolling Left: 4: Min assist Supine to Sit: 4: Min assist;With rails Sit to Supine: 4: Min assist Transfers Transfers: Sit to  Stand;Stand to Sit Sit to Stand: 3: Mod assist (mod from bed; min from 3:1 commode) Stand to Sit: 4: Min guard;To chair/3-in-1;To bed;3: Mod assist Details for Transfer Assistance: cues for safety          End of Session OT - End of Session Activity Tolerance: Patient limited by fatigue Patient left: in chair;with call bell/phone within reach  GO     [facility], [place] 08/23/2012, 9:42 AM

## 2012-08-23 NOTE — Progress Notes (Signed)
Patient has a bed @ [facility] - patient aware & completing admission paperwork right now with Eber Jones from [facility]. Anticipating discharge this afternoon per Dr. Sunnie Nielsen.   Unice Bailey, LCSW [facility] Clinical Social Worker cell #: [phone]

## 2012-08-26 ENCOUNTER — Non-Acute Institutional Stay (SKILLED_NURSING_FACILITY): Payer: Medicare Other | Admitting: Internal Medicine

## 2012-08-26 DIAGNOSIS — E871 Hypo-osmolality and hyponatremia: Secondary | ICD-10-CM

## 2012-08-26 DIAGNOSIS — I509 Heart failure, unspecified: Secondary | ICD-10-CM

## 2012-08-26 DIAGNOSIS — J449 Chronic obstructive pulmonary disease, unspecified: Secondary | ICD-10-CM

## 2012-08-26 DIAGNOSIS — I10 Essential (primary) hypertension: Secondary | ICD-10-CM

## 2012-09-14 NOTE — Progress Notes (Signed)
Patient ID: [name], female   DOB: 1941-02-06, 72 y.o.   MRN: [id number]        HISTORY & PHYSICAL  DATE: 08/26/2012   FACILITY: [facility]  LEVEL OF CARE: SNF (31)  ALLERGIES:  No Known Allergies  CHIEF COMPLAINT:  Manage COPD, hypertension, and CHF.    HISTORY OF PRESENT ILLNESS:  The patient is a 72 year-old, Caucasian female who was hospitalized for acute respiratory failure and then after hospitalization admitted to this facility for short-term rehabilitation.    COPD: the COPD remains stable.  Pt denies sob, cough, wheezing or declining exercise tolerance.  No complications from the medications presently being used.   HTN: Pt 's HTN remains stable.  Denies CP, sob, DOE, pedal edema, headaches, dizziness or visual disturbances.  No complications from the medications currently being used.  Last BP : 112/58, 127/64.  CHF:The patient does not relate significant weight changes, denies sob, DOE, orthopnea, PNDs, pedal edema, palpitations or chest pain.  CHF remains stable.  No complications form the medications being used.  2D-echo showed EF of 65%.    PAST MEDICAL HISTORY :  Past Medical History  Diagnosis Date  . Back pain   . Hypertension   . COPD (chronic obstructive pulmonary disease)   . Anxiety   . Hyperlipemia   . Compression fracture     PAST SURGICAL HISTORY: Past Surgical History  Procedure Laterality Date  . Back surgery      SOCIAL HISTORY:  reports that she has been smoking Cigarettes.  She has been smoking about 0.00 packs per day. She has never used smokeless tobacco. She reports that  drinks alcohol. She reports that she does not use illicit drugs.  FAMILY HISTORY: None  CURRENT MEDICATIONS: Reviewed per MAR  REVIEW OF SYSTEMS:  See HPI otherwise 14 point ROS is negative.  PHYSICAL EXAMINATION  VS:  T 97       P 63      RR 18      BP 112/58      POX 97%        WT (Lb)  GENERAL: no acute distress, thin body habitus SKIN:  warm & dry, no suspicious lesions or rashes, no excessive dryness EYES: conjunctivae normal, sclerae normal, normal eye lids MOUTH/THROAT: lips without lesions,no lesions in the mouth,tongue is without lesions,uvula elevates in midline NECK: supple, trachea midline, no neck masses, no thyroid tenderness, no thyromegaly LYMPHATICS: no LAN in the neck, no supraclavicular LAN RESPIRATORY: breathing is even & unlabored, BS CTAB CARDIAC: RRR, no murmur,no extra heart sounds, no edema GI:  ABDOMEN: abdomen soft, normal BS, no masses, no tenderness  LIVER/SPLEEN: no hepatomegaly, no splenomegaly MUSCULOSKELETAL: HEAD: normal to inspection & palpation BACK: no kyphosis, scoliosis or spinal processes tenderness EXTREMITIES: LEFT UPPER EXTREMITY: full range of motion, normal strength & tone RIGHT UPPER EXTREMITY:  full range of motion, normal strength & tone LEFT LOWER EXTREMITY: strength intact, range of motion moderate  RIGHT LOWER EXTREMITY: strength intact, range of motion moderate  PSYCHIATRIC: the patient is alert & oriented to person, affect & behavior appropriate  LABS/RADIOLOGY: MRSA by PCR negative.    Sodium 127, glucose 111, chloride 89, otherwise BMP normal.    WBC 15.7, hemoglobin 16.6, otherwise CBC normal.    Troponin-I less than 0.03.    Chest x-ray, thoracic spine x-ray, lumbar spine x-ray, and CT of the chest all negative.    ASSESSMENT/PLAN:  COPD.  Well compensated.  CHF.  Well compensated.   Hypertension.  Well controlled.   Chronic hyponatremia.  We will reassess sodium level.    Chronic low back pain.  Continue pain medications.    Tobacco abuse.  Taper off nicotine patch.    Check CBC and BMP.   Leukocytosis.  Likely secondary to prednisone.  Patient is asymptomatic.    I have reviewed patient's medical records received at admission/from hospitalization.  CPT CODE: 16109

## 2012-09-16 ENCOUNTER — Non-Acute Institutional Stay (SKILLED_NURSING_FACILITY): Payer: Medicare Other | Admitting: Adult Health

## 2012-09-16 DIAGNOSIS — J9610 Chronic respiratory failure, unspecified whether with hypoxia or hypercapnia: Secondary | ICD-10-CM

## 2012-09-16 DIAGNOSIS — H6120 Impacted cerumen, unspecified ear: Secondary | ICD-10-CM

## 2012-09-16 DIAGNOSIS — M549 Dorsalgia, unspecified: Secondary | ICD-10-CM

## 2012-09-16 DIAGNOSIS — E871 Hypo-osmolality and hyponatremia: Secondary | ICD-10-CM

## 2012-09-16 DIAGNOSIS — I10 Essential (primary) hypertension: Secondary | ICD-10-CM

## 2012-09-16 DIAGNOSIS — J4489 Other specified chronic obstructive pulmonary disease: Secondary | ICD-10-CM

## 2012-09-16 DIAGNOSIS — J9611 Chronic respiratory failure with hypoxia: Secondary | ICD-10-CM

## 2012-09-16 DIAGNOSIS — J449 Chronic obstructive pulmonary disease, unspecified: Secondary | ICD-10-CM

## 2012-09-16 DIAGNOSIS — H6123 Impacted cerumen, bilateral: Secondary | ICD-10-CM

## 2012-09-17 ENCOUNTER — Encounter: Payer: Self-pay | Admitting: Adult Health

## 2012-09-17 DIAGNOSIS — H6120 Impacted cerumen, unspecified ear: Secondary | ICD-10-CM | POA: Insufficient documentation

## 2012-09-17 NOTE — Progress Notes (Signed)
  Subjective:    Patient ID: [name], female    DOB: 1940/06/06, 72 y.o.   MRN: [id number]  HPI This is a 72 year old female who has been admitted to [facility] on 08/23/12 from [facility] with discharge diagnoses of Hypoxemic respiratory failure, chronic hyponatremia, back pain, COPD and hypertension. She has been admitted for a short-term rehabilitation. She has been complaining of her bilateral ears being"plugged" and asked for it to be evaluated. She has completed SNF rehabilitation and therapy has cleared the patient for discharge. She will have Home health Nursing, PT and OT.   Review of Systems  Constitutional: Negative.   HENT: Negative.   Eyes: Negative.   Respiratory: Negative for cough, chest tightness, shortness of breath and wheezing.   Cardiovascular: Negative for leg swelling.  Gastrointestinal: Negative for abdominal pain and abdominal distention.  Endocrine: Negative.   Genitourinary: Negative.   Neurological: Negative.   Hematological: Negative for adenopathy. Does not bruise/bleed easily.  Psychiatric/Behavioral: Negative.        Objective:   Physical Exam  Nursing note and vitals reviewed. Constitutional: She is oriented to person, place, and time. She appears well-developed and well-nourished.  HENT:  Head: Normocephalic and atraumatic.  Bilateral ears noted to have moderate moist yellowish earwax; no erythema noted.  Eyes: Conjunctivae and EOM are normal. Pupils are equal, round, and reactive to light.  Neck: Normal range of motion. Neck supple.  Cardiovascular: Normal rate, regular rhythm, normal heart sounds and intact distal pulses.   Pulmonary/Chest: Effort normal and breath sounds normal. No respiratory distress. She has no wheezes.  Abdominal: Soft. Bowel sounds are normal. She exhibits no distension.  Musculoskeletal: Normal range of motion. She exhibits no edema and no tenderness.  Neurological: She is alert and oriented to person,  place, and time.  Skin: Skin is warm and dry.  Psychiatric: She has a normal mood and affect. Her behavior is normal. Judgment and thought content normal.    LABS: 09/08/12  Wbc 12.4  hgb 15.4  hct 44.9  NA 134  K 4.2  Glucose 64  BUN 9  Creatinine 0.53  Calcium 9.3 08/27/12  Wbc 10.8  hgb 15.9  hct 44.6  NA 129  K 4.9  Glucose 60  BUN 10  Creatinine 0.40   Calcium 8.5   Medications reviewed per [facility]    Assessment & Plan:   Impacted cerumen - start Debrox otic gtts instill 6 gtts to bilateral ears BID x 3 days and irrigate on 4th day before discharge  Hypoxemic respiratory failure, chronic - stable; check O2 sat Q shift x 3 days  Chronic hyponatremia - stable  Back pain - stable  COPD (chronic obstructive pulmonary disease) - stable; taper off Prednisone; decrease Prednisone to 20 mg PO Q D x 3 days then Prednisone 10 mg PO Q D x 3 days then Prednisone 5 mg PO Q D x 3 days the discontinue; start Proventil 0.5 % neb sol take 0.5 ml via neb Q 10 AM  HTN (hypertension - well-controlled   I have filled out patient's discharge paperwork and written prescriptions. Patient will have Home health Nursing, PT and OT.    Total discharge time: >30 mins Discharge time involved coordination of the discharge process with social worker, nursing staff and therapy department. Medical justification for Home health services verified.    CPT CODE:   [phone]

## 2012-10-29 ENCOUNTER — Other Ambulatory Visit: Payer: Self-pay | Admitting: Internal Medicine

## 2012-12-19 IMAGING — CT CT HEAD W/O CM
2 series · 16 of 30 positions shown, 18 images · non-contrast
Comparison: No comparison studies available.

CT HEAD

CLINICAL DATA: Fall with head and facial trauma.

CT HEAD WITHOUT CONTRAST
CT MAXILLOFACIAL WITHOUT CONTRAST
TECHNIQUE: Multidetector CT imaging of the head and maxillofacial
structures were performed using the standard protocol without
intravenous contrast. Multiplanar CT image reconstructions of the
maxillofacial structures were also generated.

[Series 2: head 4.8 h37s · axial · 0.45mm/px · z∈[-151,-15]mm · 8 of 36 slices shown, 10 images]
[im 4/36  brain]
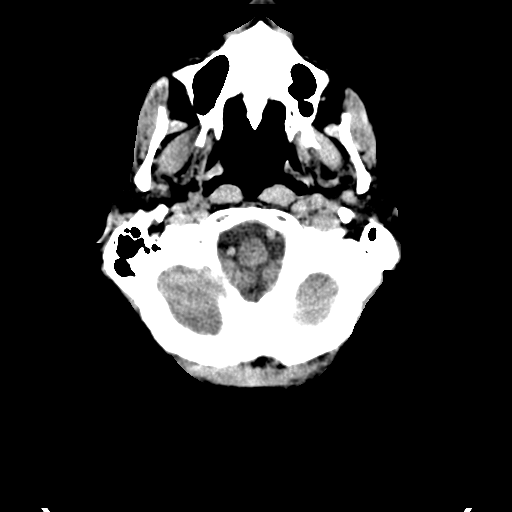
[im 4/36  bone]
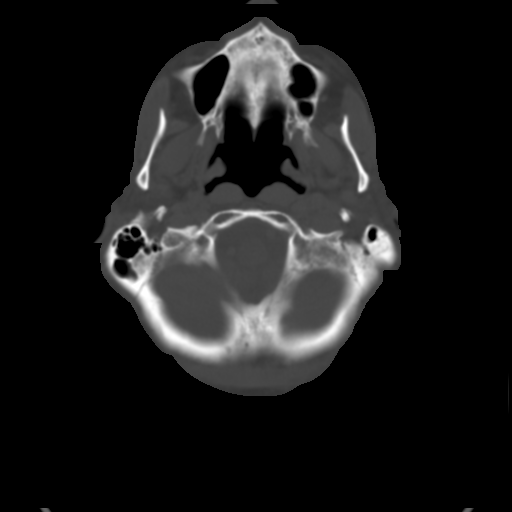
[im 8/36  brain]
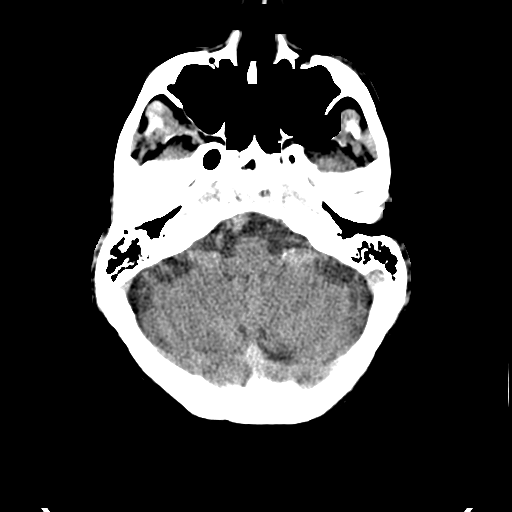
[im 12/36  brain]
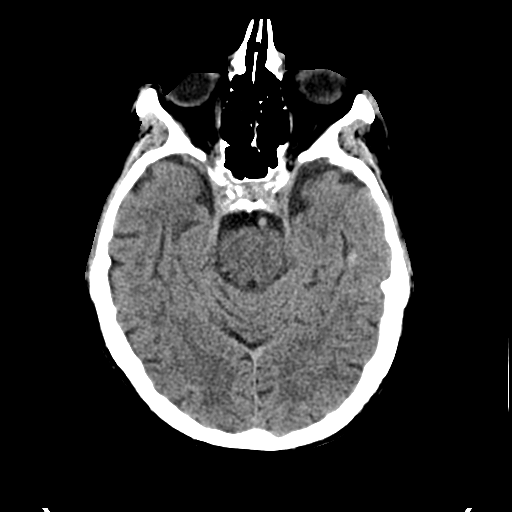
[im 16/36  brain]
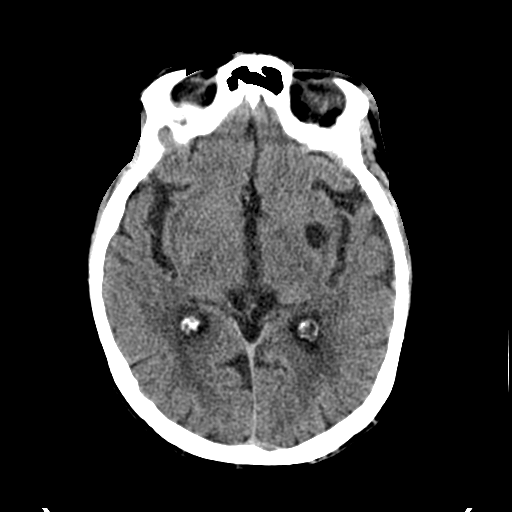
[im 20/36  brain]
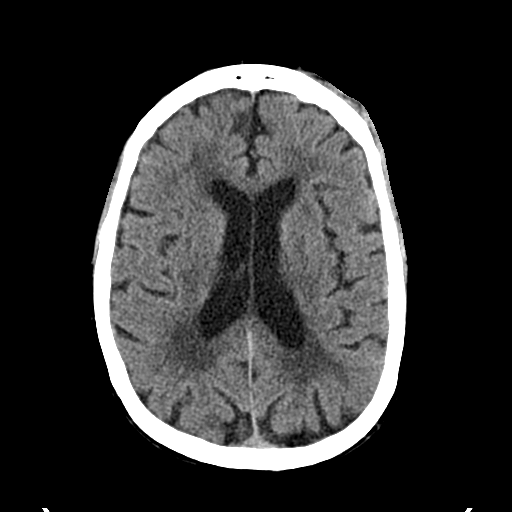
[im 20/36  bone]
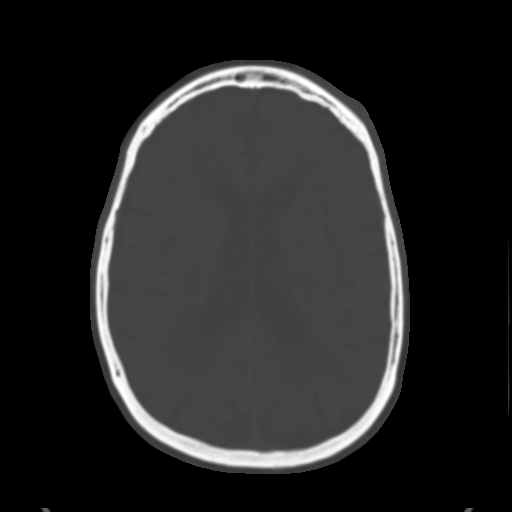
[im 24/36  brain]
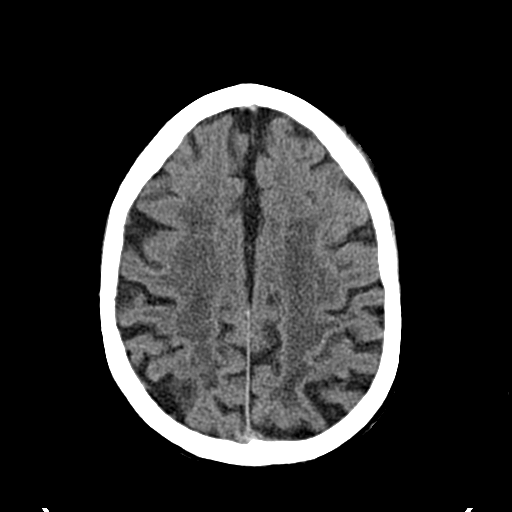
[im 28/36  brain]
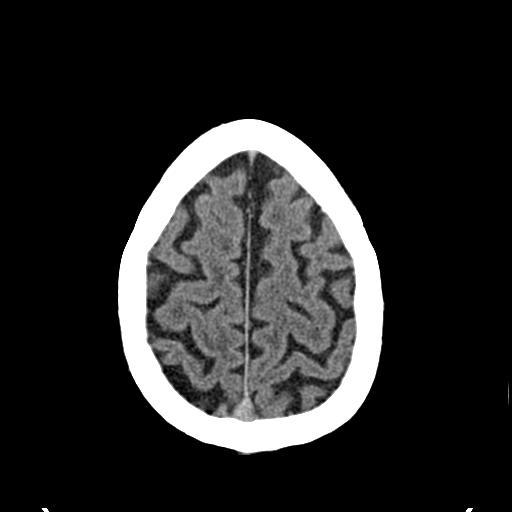
[im 32/36  brain]
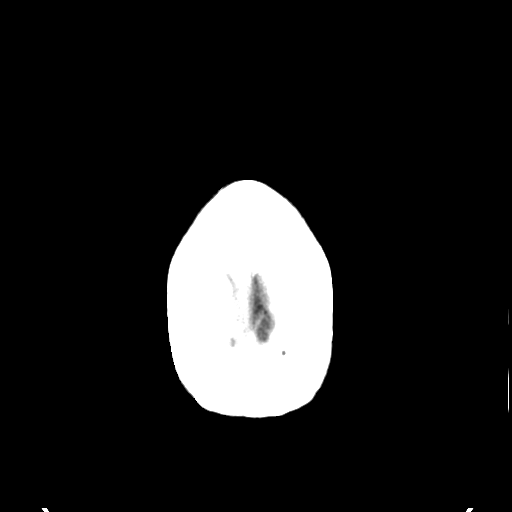

[Series 3: head 2.4 h60s bone · axial · 0.45mm/px · z∈[-150,-13]mm · 8 of 72 slices shown]
[im 8/72  bone]
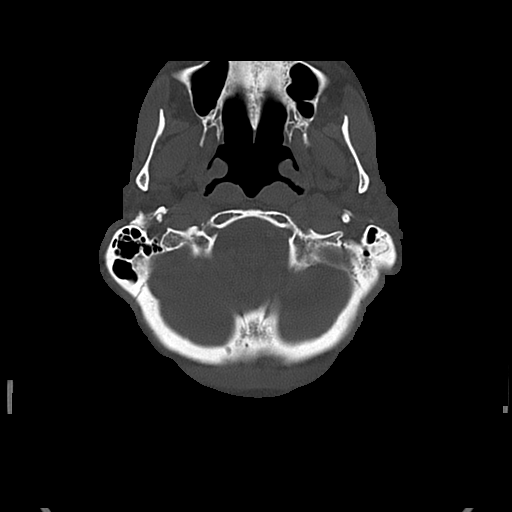
[im 15/72  bone]
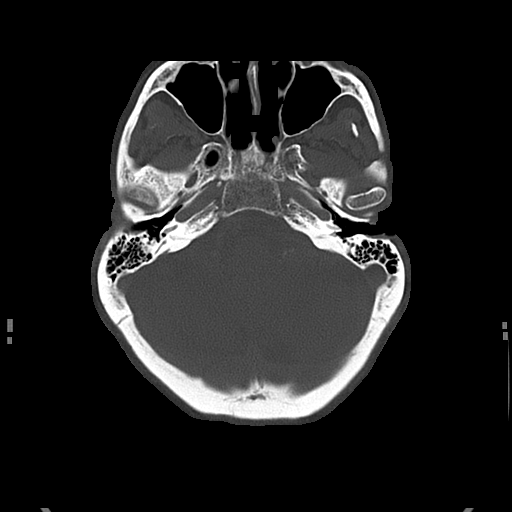
[im 23/72  bone]
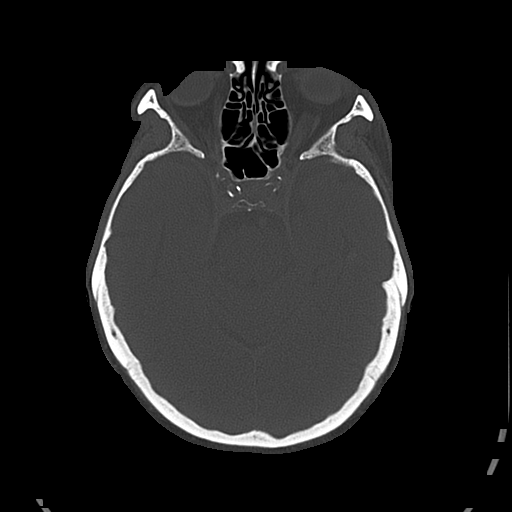
[im 30/72  bone]
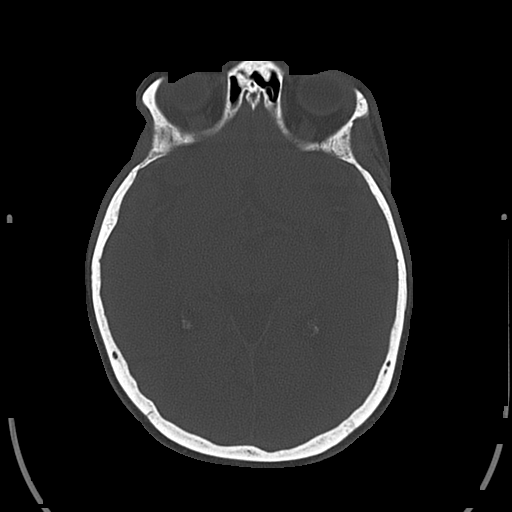
[im 42/72  bone]
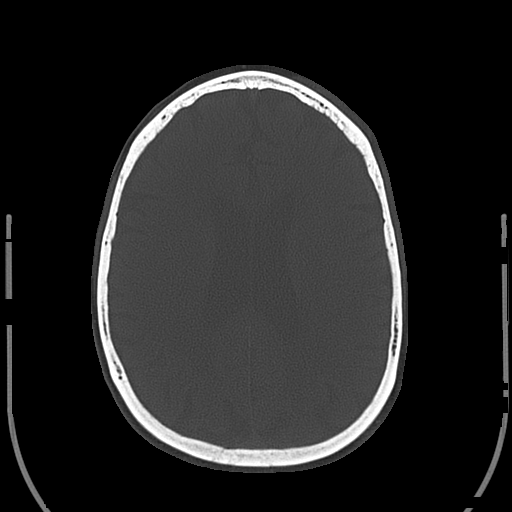
[im 49/72  bone]
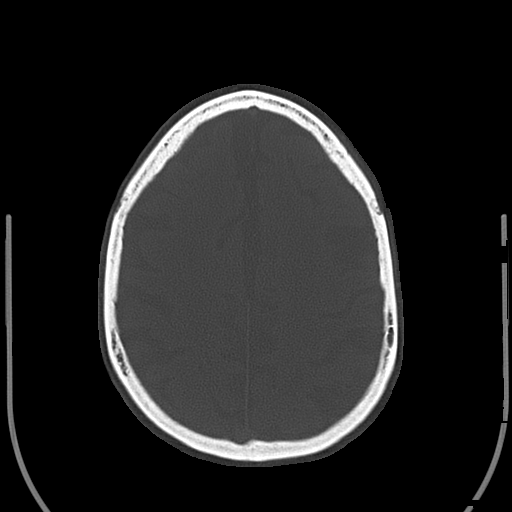
[im 57/72  bone]
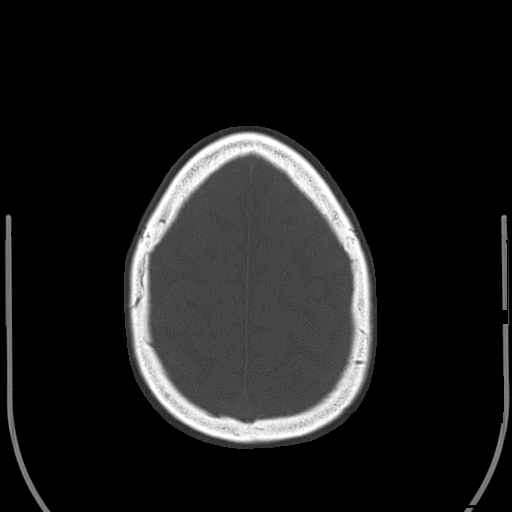
[im 64/72  bone]
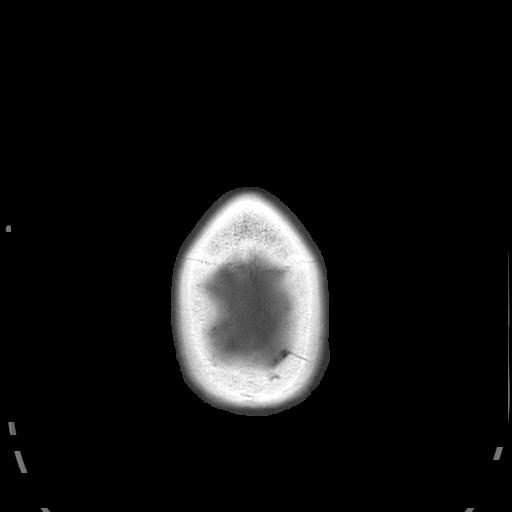

[16 of 30 positions shown; findings below may reference images not displayed]

FINDINGS: There is no evidence for acute hemorrhage, hydrocephalus,
mass lesion, or abnormal extra-axial fluid collection.  No definite
CT evidence for acute infarction.  Diffuse loss of parenchymal
volume is consistent with atrophy. Patchy low attenuation in the
deep hemispheric and periventricular white matter is nonspecific,
but likely reflects chronic microvascular ischemic demyelination.
Left frontal scalp contusion is evident.
IMPRESSION: No acute intracranial abnormality.

Atrophy with chronic small vessel white matter ischemic
demyelination.

Left frontal scalp contusion without underlying skull fracture.

CT MAXILLOFACIAL
FINDINGS: There is no evidence for an acute facial bone fracture.
Specifically, the mandible is intact and the temporomandibular
joints are located.  There is no inferior or medial orbital wall
blowout fracture.  Zygomatic arches are intact.  No air-fluid
levels in the paranasal sinuses to suggest hemorrhage.
IMPRESSION: No evidence for an acute facial fracture.

## 2013-01-19 ENCOUNTER — Emergency Department (HOSPITAL_COMMUNITY): Payer: Medicare Other

## 2013-01-19 ENCOUNTER — Emergency Department (HOSPITAL_COMMUNITY)
Admission: EM | Admit: 2013-01-19 | Discharge: 2013-01-19 | Disposition: A | Discharge status: Home / Self Care | Payer: Medicare Other | Attending: Emergency Medicine | Admitting: Emergency Medicine

## 2013-01-19 ENCOUNTER — Encounter (HOSPITAL_COMMUNITY): Payer: Self-pay | Admitting: Emergency Medicine

## 2013-01-19 DIAGNOSIS — Z8639 Personal history of other endocrine, nutritional and metabolic disease: Secondary | ICD-10-CM | POA: Insufficient documentation

## 2013-01-19 DIAGNOSIS — Z8659 Personal history of other mental and behavioral disorders: Secondary | ICD-10-CM | POA: Insufficient documentation

## 2013-01-19 DIAGNOSIS — I10 Essential (primary) hypertension: Secondary | ICD-10-CM | POA: Insufficient documentation

## 2013-01-19 DIAGNOSIS — Z862 Personal history of diseases of the blood and blood-forming organs and certain disorders involving the immune mechanism: Secondary | ICD-10-CM | POA: Insufficient documentation

## 2013-01-19 DIAGNOSIS — Z8739 Personal history of other diseases of the musculoskeletal system and connective tissue: Secondary | ICD-10-CM | POA: Insufficient documentation

## 2013-01-19 DIAGNOSIS — Z8781 Personal history of (healed) traumatic fracture: Secondary | ICD-10-CM | POA: Insufficient documentation

## 2013-01-19 DIAGNOSIS — J441 Chronic obstructive pulmonary disease with (acute) exacerbation: Secondary | ICD-10-CM | POA: Insufficient documentation

## 2013-01-19 DIAGNOSIS — J449 Chronic obstructive pulmonary disease, unspecified: Secondary | ICD-10-CM

## 2013-01-19 DIAGNOSIS — F17200 Nicotine dependence, unspecified, uncomplicated: Secondary | ICD-10-CM | POA: Insufficient documentation

## 2013-01-19 LAB — CBC
HCT: 48.5 % — ABNORMAL HIGH (ref 36.0–46.0)
MCHC: 34 g/dL (ref 30.0–36.0)
MCV: 87.4 fL (ref 78.0–100.0)
Platelets: 450 10*3/uL — ABNORMAL HIGH (ref 150–400)
RDW: 12.8 % (ref 11.5–15.5)
WBC: 12.1 10*3/uL — ABNORMAL HIGH (ref 4.0–10.5)

## 2013-01-19 LAB — BASIC METABOLIC PANEL
BUN: 11 mg/dL (ref 6–23)
CO2: 24 mEq/L (ref 19–32)
Calcium: 10 mg/dL (ref 8.4–10.5)
Chloride: 91 mEq/L — ABNORMAL LOW (ref 96–112)
Creatinine, Ser: 0.58 mg/dL (ref 0.50–1.10)

## 2013-01-19 MED ORDER — IPRATROPIUM BROMIDE 0.02 % IN SOLN
0.5000 mg | Freq: Once | RESPIRATORY_TRACT | Status: AC
  Administered 2013-01-19: 0.5 mg via RESPIRATORY_TRACT
  Filled 2013-01-19: qty 2.5

## 2013-01-19 MED ORDER — ALBUTEROL SULFATE (5 MG/ML) 0.5% IN NEBU
5.0000 mg | INHALATION_SOLUTION | Freq: Once | RESPIRATORY_TRACT | Status: AC
Start: 2013-01-19 — End: 2013-01-19
  Administered 2013-01-19: 5 mg via RESPIRATORY_TRACT
  Filled 2013-01-19: qty 1

## 2013-01-19 MED ORDER — ALBUTEROL SULFATE HFA 108 (90 BASE) MCG/ACT IN AERS
2.0000 | INHALATION_SPRAY | RESPIRATORY_TRACT | Status: DC | PRN
  Administered 2013-01-19: 2 via RESPIRATORY_TRACT
  Filled 2013-01-19: qty 6.7

## 2013-01-19 NOTE — ED Notes (Signed)
Radford Pax, MD, aware of HR 130.

## 2013-01-19 NOTE — Progress Notes (Signed)
Patient confirms her pcp is Dr. Selena Batten.  System updated.

## 2013-01-19 NOTE — ED Notes (Signed)
Pt states went to PCP this morning d/t shortness of breath, was told has some wheezing and needed to come here to be checked out.

## 2013-01-19 NOTE — ED Notes (Signed)
Dr Luciana Axe from [place] [phone]) sending pt pov for suspected COPD exacerbation. Hx of hospitalization in June for respiratory failure. Pt has hx of COPD, worsening SOB. Does not wear oxygen at home. 92% on room air. Normally 98% on room air. R 24-26, HR 118.  Pt has poor short term memory, caregiver will be with pt.

## 2013-01-19 NOTE — ED Provider Notes (Addendum)
CSN: [id number]     Arrival date & time 01/19/13  1420 History   First MD Initiated Contact with Patient 01/19/13 1535     Chief Complaint  Patient presents with  . Wheezing  . Shortness of Breath    HPI Dr Luciana Axe from [place] [phone]) sending pt pov for suspected COPD exacerbation. Hx of hospitalization in June for respiratory failure. Pt has hx of COPD, worsening SOB. Does not wear oxygen at home. 92% on room air. Normally 98% on room air. R 24-26, HR 118.   Past Medical History  Diagnosis Date  . Back pain   . Hypertension   . COPD (chronic obstructive pulmonary disease)   . Anxiety   . Hyperlipemia   . Compression fracture    Past Surgical History  Procedure Laterality Date  . Back surgery     No family history on file. History  Substance Use Topics  . Smoking status: Current Some Day Smoker    Types: Cigarettes  . Smokeless tobacco: Never Used  . Alcohol Use: Yes     Comment: wine socially   OB History   Grav Para Term Preterm Abortions TAB SAB Ect Mult Living                 Review of Systems All other systems reviewed and are negative Allergies  Review of patient's allergies indicates no known allergies.  Home Medications  No current outpatient prescriptions on file. BP 139/92  Pulse 117  Temp(Src) 97.8 F (36.6 C) (Oral)  Resp 19  SpO2 94% Physical Exam  Nursing note and vitals reviewed. Constitutional: She is oriented to person, place, and time. She appears well-developed and well-nourished. No distress.  HENT:  Head: Normocephalic and atraumatic.  Eyes: Pupils are equal, round, and reactive to light.  Neck: Normal range of motion.  Cardiovascular: Normal rate and intact distal pulses.   Pulmonary/Chest: She is in respiratory distress (mild). She has wheezes.  Abdominal: Normal appearance. She exhibits no distension.  Musculoskeletal: Normal range of motion.  Neurological: She is alert and oriented to person, place, and time. No cranial nerve  deficit.  Skin: Skin is warm and dry. No rash noted.  Psychiatric: She has a normal mood and affect. Her behavior is normal.    ED Course  Procedures (including critical care time) Medications  albuterol (PROVENTIL HFA;VENTOLIN HFA) 108 (90 BASE) MCG/ACT inhaler 2 puff (not administered)  ipratropium (ATROVENT) nebulizer solution 0.5 mg (0.5 mg Nebulization Given 01/19/13 1621)  albuterol (PROVENTIL) (5 MG/ML) 0.5% nebulizer solution 5 mg (5 mg Nebulization Given 01/19/13 1621)     Labs Review Labs Reviewed  BASIC METABOLIC PANEL - Abnormal; Notable for the following:    Sodium 126 (*)    Chloride 91 (*)    Glucose, Bld 122 (*)    GFR calc non Af Amer 89 (*)    All other components within normal limits  CBC - Abnormal; Notable for the following:    WBC 12.1 (*)    RBC 5.55 (*)    Hemoglobin 16.5 (*)    HCT 48.5 (*)    Platelets 450 (*)    All other components within normal limits  POCT I-STAT TROPONIN I   Imaging Review Dg Chest 2 View (if Patient Has Fever And/or Copd)  01/19/2013   CLINICAL DATA:  Cough and shortness of Breath  EXAM: CHEST  2 VIEW  COMPARISON:  08/17/2012  FINDINGS: The heart size and mediastinal contours are within normal limits.  Tortuous aorta with atherosclerotic calcifications identified. Both lungs are clear. The thoracic spine is kyphotic and there are several compression deformities noted.  IMPRESSION: 1.  No acute cardiopulmonary abnormality.  2.  Atherosclerotic disease.   Electronically Signed   By: Signa Kell M.D.   On: 01/19/2013 15:19    EKG Interpretation     Ventricular Rate:  119 PR Interval:  172 QRS Duration: 68 QT Interval:  296 QTC Calculation: 416 R Axis:   6 Text Interpretation:  Sinus tachycardia Atrial premature complex Right atrial enlargement No significant change since last tracing            MDM   1. COPD (chronic obstructive pulmonary disease)     After treatment in the ED the patient feels back to  baseline and wants to go home.  Pulse ox at time of discharge ranged between 93 and 97% on room air.   Nelia Shi, MD 01/19/13 1707  Nelia Shi, MD 01/30/13 [id number]

## 2013-10-19 ENCOUNTER — Encounter: Payer: Self-pay | Admitting: *Deleted

## 2013-12-06 ENCOUNTER — Emergency Department (HOSPITAL_COMMUNITY): Payer: Medicare Other

## 2013-12-06 ENCOUNTER — Encounter (HOSPITAL_COMMUNITY): Payer: Self-pay | Admitting: Emergency Medicine

## 2013-12-06 ENCOUNTER — Inpatient Hospital Stay (HOSPITAL_COMMUNITY)
Admission: EM | Admit: 2013-12-06 | Discharge: 2013-12-12 | DRG: 469 | Disposition: A | Discharge status: Skilled Nursing Facility | Payer: Medicare Other | Attending: Internal Medicine | Admitting: Internal Medicine

## 2013-12-06 DIAGNOSIS — J189 Pneumonia, unspecified organism: Secondary | ICD-10-CM | POA: Diagnosis not present

## 2013-12-06 DIAGNOSIS — M25559 Pain in unspecified hip: Secondary | ICD-10-CM | POA: Diagnosis not present

## 2013-12-06 DIAGNOSIS — M549 Dorsalgia, unspecified: Secondary | ICD-10-CM | POA: Diagnosis present

## 2013-12-06 DIAGNOSIS — Z9181 History of falling: Secondary | ICD-10-CM

## 2013-12-06 DIAGNOSIS — F1010 Alcohol abuse, uncomplicated: Secondary | ICD-10-CM | POA: Diagnosis present

## 2013-12-06 DIAGNOSIS — Z91199 Patient's noncompliance with other medical treatment and regimen due to unspecified reason: Secondary | ICD-10-CM

## 2013-12-06 DIAGNOSIS — E871 Hypo-osmolality and hyponatremia: Secondary | ICD-10-CM | POA: Diagnosis present

## 2013-12-06 DIAGNOSIS — J9620 Acute and chronic respiratory failure, unspecified whether with hypoxia or hypercapnia: Secondary | ICD-10-CM | POA: Diagnosis present

## 2013-12-06 DIAGNOSIS — Y92009 Unspecified place in unspecified non-institutional (private) residence as the place of occurrence of the external cause: Secondary | ICD-10-CM

## 2013-12-06 DIAGNOSIS — W19XXXA Unspecified fall, initial encounter: Secondary | ICD-10-CM

## 2013-12-06 DIAGNOSIS — S72002B Fracture of unspecified part of neck of left femur, initial encounter for open fracture type I or II: Secondary | ICD-10-CM | POA: Diagnosis present

## 2013-12-06 DIAGNOSIS — J438 Other emphysema: Secondary | ICD-10-CM

## 2013-12-06 DIAGNOSIS — Z681 Body mass index (BMI) 19 or less, adult: Secondary | ICD-10-CM

## 2013-12-06 DIAGNOSIS — Z720 Tobacco use: Secondary | ICD-10-CM | POA: Diagnosis present

## 2013-12-06 DIAGNOSIS — M545 Low back pain, unspecified: Secondary | ICD-10-CM | POA: Diagnosis present

## 2013-12-06 DIAGNOSIS — S72033A Displaced midcervical fracture of unspecified femur, initial encounter for closed fracture: Principal | ICD-10-CM | POA: Diagnosis present

## 2013-12-06 DIAGNOSIS — D72829 Elevated white blood cell count, unspecified: Secondary | ICD-10-CM | POA: Diagnosis present

## 2013-12-06 DIAGNOSIS — J4489 Other specified chronic obstructive pulmonary disease: Secondary | ICD-10-CM | POA: Diagnosis present

## 2013-12-06 DIAGNOSIS — E785 Hyperlipidemia, unspecified: Secondary | ICD-10-CM | POA: Diagnosis present

## 2013-12-06 DIAGNOSIS — I517 Cardiomegaly: Secondary | ICD-10-CM | POA: Diagnosis present

## 2013-12-06 DIAGNOSIS — F411 Generalized anxiety disorder: Secondary | ICD-10-CM | POA: Diagnosis present

## 2013-12-06 DIAGNOSIS — S72002K Fracture of unspecified part of neck of left femur, subsequent encounter for closed fracture with nonunion: Secondary | ICD-10-CM

## 2013-12-06 DIAGNOSIS — S72009A Fracture of unspecified part of neck of unspecified femur, initial encounter for closed fracture: Secondary | ICD-10-CM

## 2013-12-06 DIAGNOSIS — G8929 Other chronic pain: Secondary | ICD-10-CM | POA: Diagnosis present

## 2013-12-06 DIAGNOSIS — E440 Moderate protein-calorie malnutrition: Secondary | ICD-10-CM | POA: Diagnosis present

## 2013-12-06 DIAGNOSIS — J9611 Chronic respiratory failure with hypoxia: Secondary | ICD-10-CM | POA: Diagnosis present

## 2013-12-06 DIAGNOSIS — R222 Localized swelling, mass and lump, trunk: Secondary | ICD-10-CM | POA: Diagnosis present

## 2013-12-06 DIAGNOSIS — F17200 Nicotine dependence, unspecified, uncomplicated: Secondary | ICD-10-CM

## 2013-12-06 DIAGNOSIS — S72002A Fracture of unspecified part of neck of left femur, initial encounter for closed fracture: Secondary | ICD-10-CM

## 2013-12-06 DIAGNOSIS — R918 Other nonspecific abnormal finding of lung field: Secondary | ICD-10-CM | POA: Diagnosis present

## 2013-12-06 DIAGNOSIS — J449 Chronic obstructive pulmonary disease, unspecified: Secondary | ICD-10-CM

## 2013-12-06 DIAGNOSIS — J9601 Acute respiratory failure with hypoxia: Secondary | ICD-10-CM

## 2013-12-06 DIAGNOSIS — Z79899 Other long term (current) drug therapy: Secondary | ICD-10-CM

## 2013-12-06 DIAGNOSIS — W010XXA Fall on same level from slipping, tripping and stumbling without subsequent striking against object, initial encounter: Secondary | ICD-10-CM | POA: Diagnosis present

## 2013-12-06 DIAGNOSIS — Z9119 Patient's noncompliance with other medical treatment and regimen: Secondary | ICD-10-CM

## 2013-12-06 DIAGNOSIS — I10 Essential (primary) hypertension: Secondary | ICD-10-CM | POA: Diagnosis present

## 2013-12-06 LAB — CBC WITH DIFFERENTIAL/PLATELET
BASOS ABS: 0 10*3/uL (ref 0.0–0.1)
BASOS PCT: 0 % (ref 0–1)
Eosinophils Absolute: 0.1 10*3/uL (ref 0.0–0.7)
Eosinophils Relative: 0 % (ref 0–5)
HCT: 44.3 % (ref 36.0–46.0)
Hemoglobin: 14.8 g/dL (ref 12.0–15.0)
Lymphocytes Relative: 13 % (ref 12–46)
Lymphs Abs: 2 10*3/uL (ref 0.7–4.0)
MCH: 29.8 pg (ref 26.0–34.0)
MCHC: 33.4 g/dL (ref 30.0–36.0)
MCV: 89.3 fL (ref 78.0–100.0)
Monocytes Absolute: 1.1 10*3/uL — ABNORMAL HIGH (ref 0.1–1.0)
Monocytes Relative: 7 % (ref 3–12)
NEUTROS ABS: 12.7 10*3/uL — AB (ref 1.7–7.7)
Neutrophils Relative %: 80 % — ABNORMAL HIGH (ref 43–77)
PLATELETS: 443 10*3/uL — AB (ref 150–400)
RBC: 4.96 MIL/uL (ref 3.87–5.11)
RDW: 13.8 % (ref 11.5–15.5)
WBC: 15.9 10*3/uL — ABNORMAL HIGH (ref 4.0–10.5)

## 2013-12-06 LAB — BASIC METABOLIC PANEL
ANION GAP: 15 (ref 5–15)
BUN: 17 mg/dL (ref 6–23)
CALCIUM: 9.6 mg/dL (ref 8.4–10.5)
CO2: 26 meq/L (ref 19–32)
Chloride: 91 mEq/L — ABNORMAL LOW (ref 96–112)
Creatinine, Ser: 0.61 mg/dL (ref 0.50–1.10)
GFR calc Af Amer: >90 mL/min (ref >90)
GFR, EST NON AFRICAN AMERICAN: 87 mL/min — AB (ref >90)
Glucose, Bld: 86 mg/dL (ref 70–99)
Potassium: 3.9 mEq/L (ref 3.7–5.3)
SODIUM: 132 meq/L — AB (ref 137–147)

## 2013-12-06 LAB — TYPE AND SCREEN
ABO/RH(D): O POS
ANTIBODY SCREEN: NEGATIVE

## 2013-12-06 LAB — PROTIME-INR
INR: 1.26 (ref 0.00–1.49)
Prothrombin Time: 15.8 seconds — ABNORMAL HIGH (ref 11.6–15.2)

## 2013-12-06 LAB — ABO/RH: ABO/RH(D): O POS

## 2013-12-06 LAB — ETHANOL: Alcohol, Ethyl (B): <11 mg/dL (ref 0–11)

## 2013-12-06 MED ORDER — THIAMINE HCL 100 MG/ML IJ SOLN
100.0000 mg | Freq: Every day | INTRAMUSCULAR | Status: DC
  Administered 2013-12-07: 100 mg via INTRAVENOUS
  Filled 2013-12-06 (×6): qty 1

## 2013-12-06 MED ORDER — METOPROLOL TARTRATE 1 MG/ML IV SOLN: 5.0000 mg | Freq: Four times a day (QID) | INTRAVENOUS | Status: DC

## 2013-12-06 MED ORDER — LEVALBUTEROL HCL 0.63 MG/3ML IN NEBU
0.6300 mg | INHALATION_SOLUTION | Freq: Four times a day (QID) | RESPIRATORY_TRACT | Status: DC
  Administered 2013-12-07 – 2013-12-08 (×5): 0.63 mg via RESPIRATORY_TRACT
  Filled 2013-12-06 (×13): qty 3

## 2013-12-06 MED ORDER — METOPROLOL TARTRATE 1 MG/ML IV SOLN
5.0000 mg | Freq: Four times a day (QID) | INTRAVENOUS | Status: DC
  Administered 2013-12-07 – 2013-12-08 (×8): 5 mg via INTRAVENOUS
  Filled 2013-12-06 (×10): qty 5

## 2013-12-06 MED ORDER — SODIUM CHLORIDE 0.9 % IV SOLN
INTRAVENOUS | Status: DC
  Administered 2013-12-07 – 2013-12-11 (×4): via INTRAVENOUS

## 2013-12-06 MED ORDER — DM-GUAIFENESIN ER 30-600 MG PO TB12
1.0000 | ORAL_TABLET | Freq: Two times a day (BID) | ORAL | Status: DC
  Administered 2013-12-07 – 2013-12-12 (×10): 1 via ORAL
  Filled 2013-12-06 (×13): qty 1

## 2013-12-06 MED ORDER — HEPARIN SODIUM (PORCINE) 5000 UNIT/ML IJ SOLN
5000.0000 [IU] | Freq: Three times a day (TID) | INTRAMUSCULAR | Status: AC
  Administered 2013-12-07 (×3): 5000 [IU] via SUBCUTANEOUS
  Filled 2013-12-06 (×5): qty 1

## 2013-12-06 MED ORDER — HYDROCODONE-ACETAMINOPHEN 5-325 MG PO TABS
1.0000 | ORAL_TABLET | Freq: Once | ORAL | Status: AC
  Administered 2013-12-06: 1 via ORAL
  Filled 2013-12-06: qty 1

## 2013-12-06 MED ORDER — LORAZEPAM 2 MG/ML IJ SOLN
1.0000 mg | Freq: Four times a day (QID) | INTRAMUSCULAR | Status: AC | PRN
Start: 2013-12-06 — End: 2013-12-09
  Administered 2013-12-07: 1 mg via INTRAVENOUS
  Filled 2013-12-06: qty 1

## 2013-12-06 MED ORDER — NICOTINE 21 MG/24HR TD PT24
21.0000 mg | MEDICATED_PATCH | Freq: Every day | TRANSDERMAL | Status: DC
  Administered 2013-12-07 – 2013-12-12 (×5): 21 mg via TRANSDERMAL
  Filled 2013-12-06 (×6): qty 1

## 2013-12-06 MED ORDER — LORAZEPAM 1 MG PO TABS
1.0000 mg | ORAL_TABLET | Freq: Four times a day (QID) | ORAL | Status: AC | PRN
  Administered 2013-12-07: 1 mg via ORAL
  Filled 2013-12-06: qty 1

## 2013-12-06 MED ORDER — VITAMIN B-1 100 MG PO TABS
100.0000 mg | ORAL_TABLET | Freq: Every day | ORAL | Status: DC
  Administered 2013-12-09 – 2013-12-12 (×4): 100 mg via ORAL
  Filled 2013-12-06 (×6): qty 1

## 2013-12-06 MED ORDER — ADULT MULTIVITAMIN W/MINERALS CH
1.0000 | ORAL_TABLET | Freq: Every day | ORAL | Status: DC
  Administered 2013-12-07 – 2013-12-12 (×5): 1 via ORAL
  Filled 2013-12-06 (×6): qty 1

## 2013-12-06 MED ORDER — FOLIC ACID 1 MG PO TABS
1.0000 mg | ORAL_TABLET | Freq: Every day | ORAL | Status: DC
  Administered 2013-12-07 – 2013-12-12 (×5): 1 mg via ORAL
  Filled 2013-12-06 (×6): qty 1

## 2013-12-06 MED ORDER — IOHEXOL 300 MG/ML  SOLN
80.0000 mL | Freq: Once | INTRAMUSCULAR | Status: AC | PRN
  Administered 2013-12-06: 80 mL via INTRAVENOUS

## 2013-12-06 NOTE — H&P (Signed)
[facility] History and Physical  [name] ZOX:[id number] DOB: 1940/06/04 DOA: 12/06/2013  Referring physician: PCP: Gweneth Dimitri, MD  Specialists:   Chief Complaint: Left hip fracture  HPI: [name] is a 73 y.o. female  PMHx anxiety, HTN, HLD COPD, chronic hypoxic respiratory failure, chronic hyponatremia, chronic back pain. Presented reporting that she reports tripping over her shoes causing her to fall at approximately 1530. She hit her left side. Complains of moderate pain in her left low back. She denies head trauma or neck pain. No loss of consciousness. No lower extremity weakness. Patient fell 3 weeks ago and struck chest on floor. Lives by herself but has 24-hour home health. Her home health patient drinks 3 glasses of wine per day, and has been counseled that this most likely is also playing into her frequent falls. States she does not use home O2 but positive DOE, however has a cough x1 year productive (green) which has not been evaluated. States is supposed to be on an inhaler but does not use. States on 9/14 all bottom teeth removed secondary to chips in teeth.   Review of Systems: The patient denies anorexia, fever, weight loss,, vision loss, decreased hearing, hoarseness, chest pain, syncope, peripheral edema,hemoptysis, abdominal pain, melena, hematochezia, severe indigestion/heartburn, hematuria, incontinence, genital sores, muscle weakness, suspicious skin lesions, transient blindness, depression, unusual weight change, abnormal bleeding, enlarged lymph nodes, angioedema, and breast masses.    TRAVEL HISTORY: NA   Consultants:  Dr. Jene Every (orthopedic surgery)    Procedure/Significant Events:  5/29/ 2014 echocardiogram; eft ventricle: moderate LVH.-LVEF= 65% to 70%.  - Pulmonary arteries: PA peak pressure: 34mm Hg (S). 9/15 DG left hip complete; Mldly displaced subcapital fracture of the left femoral neck. 9/15 PCXR; Cardiomegaly w/o pulmonary  edema.-Bronchitic changes.  -Right hilar mass or adenopathy?     Culture  NA   Antibiotics:  NA   DVT prophylaxis:  Subcutaneous heparin  Devices  NA   LINES / TUBES:     Past Medical History  Diagnosis Date  . Back pain   . Hypertension   . COPD (chronic obstructive pulmonary disease)   . Anxiety   . Hyperlipemia   . Compression fracture   . Hypercalcemia    Past Surgical History  Procedure Laterality Date  . Back surgery    . Dental surgery     Social History:  1/2 PPD x30 years (continues to smoke), positive EtOH (3 glasses of wine/night), negative illicit drugs. Lives at home by herself but has 24-hour home health care   No Known Allergies  History reviewed. No pertinent family history.   Prior to Admission medications   Medication Sig Start Date End Date Taking? Authorizing Provider  acetaminophen (TYLENOL) 325 MG tablet Take 650 mg by mouth every 6 (six) hours as needed for mild pain.   Yes Historical Provider, MD   Physical Exam: Filed Vitals:   12/06/13 1700 12/06/13 2015 12/06/13 2234  BP: 139/103 175/76 178/88  Pulse: 86 88 92  Temp: 97.6 F (36.4 C) 98 F (36.7 C) 98.1 F (36.7 C)  TempSrc: Oral Oral Oral  Resp: SpO2: 96% 90% 92%     General:  A./O. x4, NAD, negative acute respiratory failure  Eyes: Pupils equal round reactive to light and accommodation  Neck: Negative JVD, negative lymphadenopathy  Cardiovascular: Regular rhythm and rate, negative murmurs rubs or gallops, normal S1/S2  Respiratory: Positive diffuse rhonchi, decreased breath sounds RML  Abdomen: Soft, nontender, nondistended,  plus bowel sound  Musculoskeletal: Negative pain to palpation in the left hip area patient able to move both legs bilaterally; did not ambulate patient    Labs on Admission:  Basic Metabolic Panel:  Recent Labs Lab 12/06/13 1842  NA 132*  K 3.9  CL 91*  CO2 26  GLUCOSE 86  BUN 17  CREATININE 0.61  CALCIUM 9.6    Liver Function Tests: No results found for this basename: AST, ALT, ALKPHOS, BILITOT, PROT, ALBUMIN,  in the last 168 hours No results found for this basename: LIPASE, AMYLASE,  in the last 168 hours No results found for this basename: AMMONIA,  in the last 168 hours CBC:  Recent Labs Lab 12/06/13 1842  WBC 15.9*  NEUTROABS 12.7*  HGB 14.8  HCT 44.3  MCV 89.3  PLT 443*   Cardiac Enzymes: No results found for this basename: CKTOTAL, CKMB, CKMBINDEX, TROPONINI,  in the last 168 hours  BNP (last 3 results) No results found for this basename: PROBNP,  in the last 8760 hours CBG: No results found for this basename: GLUCAP,  in the last 168 hours  Radiological Exams on Admission: Dg Hip Complete Left  12/06/2013   CLINICAL DATA:  Left hip pain after falling today.  EXAM: LEFT HIP - COMPLETE 2+ VIEW  COMPARISON:  None.  FINDINGS: The bones are demineralized. There is a subcapital fracture of the left femur which appears is mildly displaced. There is no evidence of dislocation or pelvic fracture. The right femoral neck appears intact.  IMPRESSION: Mildly displaced subcapital fracture of the left femoral neck.   Electronically Signed   By: Roxy Horseman M.D.   On: 12/06/2013 18:07   Dg Chest Port 1 View  12/06/2013   CLINICAL DATA:  Fall.  Smoker.  Cough.  EXAM: PORTABLE CHEST - 1 VIEW  COMPARISON:  01/19/2013  FINDINGS: Heart is mildly enlarged. There is prominence of interstitial markings. Perihilar peribronchial thickening is present. There are no focal consolidations. No pleural effusion. No pneumothorax. There is question of a mass in the right hilar region. Further evaluation with chest CT is recommended. Intravenous contrast is recommended unless contraindicated. No acute, displaced fracture.  IMPRESSION: 1. Cardiomegaly without pulmonary edema. 2. Bronchitic changes. 3. Question of right hilar mass or adenopathy. Further evaluation with CT of the chest is recommended.   Electronically  Signed   By: Rosalie Gums M.D.   On: 12/06/2013 20:00    EKG: Pending  Assessment/Plan Principal Problem:   Fracture of hip, left, open Active Problems:   Hypoxemic respiratory failure, chronic   Chronic hyponatremia   Back pain   COPD (chronic obstructive pulmonary disease)   HTN (hypertension)   Hilar mass   Alcohol abuse   Nicotine abuse   Cardiomegaly   Leukocytosis    left hip fracture -Review of x-ray appears that most likely will be a non-operative hip fracture, however still waiting to hear from orthopedics. -Continue patient n.p.o. except for ice chips -Currently patient's pain is controlled rating pain at 0/10  Alcohol abuse -Patient admits to drinking 3 glasses of wine per night will place patient on CIWA protocol -Continue alcohol withdrawal monitoring -Counseled patient that this could be causing/exacerbating her multiple falls at home.  Chronic hypoxic respiratory failure/new onset call x1 year -Patient states not on home O2 -however is on an inhaler unknown name; not inpatient MAR - Xopenex nebulizer QID -Continue O2 to maintain SpO2>92 percent -Mucinex DM -Flutter valve q 4hr while awake  Right hilar mass? -Obtain stat chest CT with contrast; given patient's long history of smoking neoplasm, TOPS the list  COPD -See chronic hypoxic respiratory failure  Nicotine abuse -Nicotine patch  HTN -Metoprolol IV 5 mg QID -Would start lisinopril after orthopedic surgery decides if surgery required  Cardiomegaly/CHF? -Echocardiogram pending  Chronic hyponatremia -Most likely secondary to patient's alcoholism, will continue to monitor  Leukocytosis -Most likely reactive secondary to having patient's lower teeth removed on 9/14, however if patient spikes a fever would start antibiotics -Obtain blood cultures and urine cultures    Code Status: Full Family Communication: Home health care nurse present Disposition Plan: Per orthopedic surgery  Time  spent: 60 minutes  Labib Cwynar, Roselind Messier [facility] Pager [phone]  If 7PM-7AM, please contact night-coverage [email or web address] Password [facility] 12/06/2013, 11:46 PM

## 2013-12-06 NOTE — ED Notes (Signed)
Per EMS: pt. [name] this afternoon. Pt. Had dental surgery yesterday and was under anesthesia. Pt. Did not eat this AM. Pt. Lost balance when she stood up to put shoes on. Complaints of Left hip pain and pain near spine. EMS found no shortness, rotation, or deformity of left leg. Pt. Is not on blood thinners, denies LOC, denies hitting head. BP 176/96 HR 81 O2 96% RA CBG 96

## 2013-12-06 NOTE — ED Provider Notes (Signed)
CSN: [id number]     Arrival date & time 12/06/13  1636 History   First MD Initiated Contact with Patient 12/06/13 1657     Chief Complaint  Patient presents with  . Fall  . Hip Pain     (Consider location/radiation/quality/duration/timing/severity/associated sxs/prior Treatment) HPI Comments: 73 year old female who reports tripping over her shoes causing her to fall. She hit her left side. Complains of moderate pain in her left low back. She denies head trauma or neck pain. No loss of consciousness. No lower extremity weakness.  Patient is a 73 y.o. female presenting with fall and hip pain.  Fall This is a new problem. The current episode started 1 to 2 hours ago. Episode frequency: once. The problem has been resolved. Pertinent negatives include no chest pain, no abdominal pain and no shortness of breath. Associated symptoms comments: Left low back pain. Exacerbated by: movement. Nothing relieves the symptoms. She has tried nothing for the symptoms.  Hip Pain Pertinent negatives include no chest pain, no abdominal pain and no shortness of breath.    Past Medical History  Diagnosis Date  . Back pain   . Hypertension   . COPD (chronic obstructive pulmonary disease)   . Anxiety   . Hyperlipemia   . Compression fracture   . Hypercalcemia    Past Surgical History  Procedure Laterality Date  . Back surgery    . Dental surgery     No family history on file. History  Substance Use Topics  . Smoking status: Current Some Day Smoker    Types: Cigarettes  . Smokeless tobacco: Never Used  . Alcohol Use: 1.2 oz/week    2 Glasses of wine per week     Comment: wine socially   OB History   Grav Para Term Preterm Abortions TAB SAB Ect Mult Living                 Review of Systems  Respiratory: Negative for shortness of breath.   Cardiovascular: Negative for chest pain.  Gastrointestinal: Negative for abdominal pain.  All other systems reviewed and are negative.     Allergies   Review of patient's allergies indicates no known allergies.  Home Medications   Prior to Admission medications   Medication Sig Start Date End Date Taking? Authorizing Provider  acetaminophen (TYLENOL) 325 MG tablet Take 650 mg by mouth every 6 (six) hours as needed for mild pain.   Yes Historical Provider, MD   BP 139/103  Pulse 86  Temp(Src) 97.6 F (36.4 C) (Oral)  Resp 18  SpO2 96% Physical Exam  Nursing note and vitals reviewed. Constitutional: She is oriented to person, place, and time. She appears well-developed and well-nourished. No distress.  HENT:  Head: Normocephalic and atraumatic. Head is without raccoon's eyes and without Battle's sign.  Nose: Nose normal.  Eyes: Conjunctivae and EOM are normal. Pupils are equal, round, and reactive to light. No scleral icterus.  Neck: No spinous process tenderness and no muscular tenderness present.  Cardiovascular: Normal rate, regular rhythm, normal heart sounds and intact distal pulses.   No murmur heard. Pulmonary/Chest: Effort normal and breath sounds normal. She has no rales. She exhibits no tenderness.  Abdominal: Soft. There is no tenderness. There is no rebound and no guarding.  Musculoskeletal: Normal range of motion. She exhibits no edema.       Thoracic back: She exhibits no tenderness and no bony tenderness.       Lumbar back: She exhibits tenderness. She  exhibits normal range of motion (pain with range of motion), no bony tenderness and normal pulse.       Back:  No evidence of trauma to extremities, except as noted.  2+ distal pulses.    Neurological: She is alert and oriented to person, place, and time.  Skin: Skin is warm and dry. No rash noted.  Psychiatric: She has a normal mood and affect.    ED Course  Procedures (including critical care time) Labs Review Labs Reviewed  BASIC METABOLIC PANEL - Abnormal; Notable for the following:    Sodium 132 (*)    Chloride 91 (*)    GFR calc non Af Amer 87 (*)     All other components within normal limits  CBC WITH DIFFERENTIAL - Abnormal; Notable for the following:    WBC 15.9 (*)    Platelets 443 (*)    Neutrophils Relative % 80 (*)    Neutro Abs 12.7 (*)    Monocytes Absolute 1.1 (*)    All other components within normal limits  PROTIME-INR - Abnormal; Notable for the following:    Prothrombin Time 15.8 (*)    All other components within normal limits  ETHANOL  URINE RAPID DRUG SCREEN (HOSP PERFORMED)  TYPE AND SCREEN  ABO/RH    Imaging Review Dg Hip Complete Left  12/06/2013   CLINICAL DATA:  Left hip pain after falling today.  EXAM: LEFT HIP - COMPLETE 2+ VIEW  COMPARISON:  None.  FINDINGS: The bones are demineralized. There is a subcapital fracture of the left femur which appears is mildly displaced. There is no evidence of dislocation or pelvic fracture. The right femoral neck appears intact.  IMPRESSION: Mildly displaced subcapital fracture of the left femoral neck.   Electronically Signed   By: Roxy Horseman M.D.   On: 12/06/2013 18:07   Dg Chest Port 1 View  12/06/2013   CLINICAL DATA:  Fall.  Smoker.  Cough.  EXAM: PORTABLE CHEST - 1 VIEW  COMPARISON:  01/19/2013  FINDINGS: Heart is mildly enlarged. There is prominence of interstitial markings. Perihilar peribronchial thickening is present. There are no focal consolidations. No pleural effusion. No pneumothorax. There is question of a mass in the right hilar region. Further evaluation with chest CT is recommended. Intravenous contrast is recommended unless contraindicated. No acute, displaced fracture.  IMPRESSION: 1. Cardiomegaly without pulmonary edema. 2. Bronchitic changes. 3. Question of right hilar mass or adenopathy. Further evaluation with CT of the chest is recommended.   Electronically Signed   By: Rosalie Gums M.D.   On: 12/06/2013 20:00   Unable to personally view images secondary to computer malfunction   EKG Interpretation   Date/Time:  Tuesday December 06 2013 22:44:31  EDT Ventricular Rate:  82 PR Interval:  136 QRS Duration: 78 QT Interval:  376 QTC Calculation: 439 R Axis:   47 Text Interpretation:  Sinus rhythm Right atrial enlargement compared to  prior, rate improved Confirmed by [facility]  MD, TREY ([id number]) on 12/07/2013  1:17:15 AM      MDM   Final diagnoses:  Closed left hip fracture, initial encounter  Fall, initial encounter    73 year old female who fell complaining of pain in her left hip/left low back.  No other injuries by history or exam. Plan plain films and Norco for pain.  Films indicate left subcapital hip fracture.  No other injuries identified by history or exam.  I was unable to reach Dr. Carola Frost (orthopedics), but I did discuss her  case with Dr. Shelle Iron who will arrange her orthopedic evaluation.  [facility] have admitted.     Candyce Churn III, MD 12/07/13 [phone]

## 2013-12-06 NOTE — ED Notes (Signed)
Bed: [facility] Expected date:  Expected time:  Means of arrival:  Comments: ems  

## 2013-12-07 ENCOUNTER — Encounter (HOSPITAL_COMMUNITY): Payer: Self-pay | Admitting: Radiology

## 2013-12-07 ENCOUNTER — Ambulatory Visit (HOSPITAL_COMMUNITY): Payer: Medicare Other

## 2013-12-07 DIAGNOSIS — Z9181 History of falling: Secondary | ICD-10-CM | POA: Diagnosis not present

## 2013-12-07 DIAGNOSIS — J9620 Acute and chronic respiratory failure, unspecified whether with hypoxia or hypercapnia: Secondary | ICD-10-CM | POA: Diagnosis present

## 2013-12-07 DIAGNOSIS — F17200 Nicotine dependence, unspecified, uncomplicated: Secondary | ICD-10-CM | POA: Diagnosis present

## 2013-12-07 DIAGNOSIS — E871 Hypo-osmolality and hyponatremia: Secondary | ICD-10-CM | POA: Diagnosis present

## 2013-12-07 DIAGNOSIS — E440 Moderate protein-calorie malnutrition: Secondary | ICD-10-CM | POA: Insufficient documentation

## 2013-12-07 DIAGNOSIS — S72002A Fracture of unspecified part of neck of left femur, initial encounter for closed fracture: Secondary | ICD-10-CM | POA: Diagnosis present

## 2013-12-07 DIAGNOSIS — F411 Generalized anxiety disorder: Secondary | ICD-10-CM | POA: Diagnosis present

## 2013-12-07 DIAGNOSIS — J438 Other emphysema: Secondary | ICD-10-CM

## 2013-12-07 DIAGNOSIS — I10 Essential (primary) hypertension: Secondary | ICD-10-CM | POA: Diagnosis present

## 2013-12-07 DIAGNOSIS — Z681 Body mass index (BMI) 19 or less, adult: Secondary | ICD-10-CM | POA: Diagnosis not present

## 2013-12-07 DIAGNOSIS — Z91199 Patient's noncompliance with other medical treatment and regimen due to unspecified reason: Secondary | ICD-10-CM | POA: Diagnosis not present

## 2013-12-07 DIAGNOSIS — J189 Pneumonia, unspecified organism: Secondary | ICD-10-CM | POA: Diagnosis not present

## 2013-12-07 DIAGNOSIS — G8929 Other chronic pain: Secondary | ICD-10-CM | POA: Diagnosis present

## 2013-12-07 DIAGNOSIS — M25559 Pain in unspecified hip: Secondary | ICD-10-CM | POA: Diagnosis present

## 2013-12-07 DIAGNOSIS — R222 Localized swelling, mass and lump, trunk: Secondary | ICD-10-CM | POA: Diagnosis present

## 2013-12-07 DIAGNOSIS — S72033A Displaced midcervical fracture of unspecified femur, initial encounter for closed fracture: Secondary | ICD-10-CM | POA: Diagnosis present

## 2013-12-07 DIAGNOSIS — M545 Low back pain, unspecified: Secondary | ICD-10-CM | POA: Diagnosis present

## 2013-12-07 DIAGNOSIS — F1010 Alcohol abuse, uncomplicated: Secondary | ICD-10-CM | POA: Diagnosis present

## 2013-12-07 DIAGNOSIS — W010XXA Fall on same level from slipping, tripping and stumbling without subsequent striking against object, initial encounter: Secondary | ICD-10-CM | POA: Diagnosis present

## 2013-12-07 DIAGNOSIS — Z79899 Other long term (current) drug therapy: Secondary | ICD-10-CM | POA: Diagnosis not present

## 2013-12-07 DIAGNOSIS — Y92009 Unspecified place in unspecified non-institutional (private) residence as the place of occurrence of the external cause: Secondary | ICD-10-CM | POA: Diagnosis not present

## 2013-12-07 DIAGNOSIS — E785 Hyperlipidemia, unspecified: Secondary | ICD-10-CM | POA: Diagnosis present

## 2013-12-07 DIAGNOSIS — J449 Chronic obstructive pulmonary disease, unspecified: Secondary | ICD-10-CM | POA: Diagnosis present

## 2013-12-07 DIAGNOSIS — I517 Cardiomegaly: Secondary | ICD-10-CM | POA: Diagnosis present

## 2013-12-07 DIAGNOSIS — I519 Heart disease, unspecified: Secondary | ICD-10-CM

## 2013-12-07 LAB — URINALYSIS, ROUTINE W REFLEX MICROSCOPIC
BILIRUBIN URINE: NEGATIVE
Glucose, UA: NEGATIVE mg/dL
Hgb urine dipstick: NEGATIVE
KETONES UR: 40 mg/dL — AB
LEUKOCYTES UA: NEGATIVE
NITRITE: NEGATIVE
PROTEIN: NEGATIVE mg/dL
Specific Gravity, Urine: 1.022 (ref 1.005–1.030)
UROBILINOGEN UA: 1 mg/dL (ref 0.0–1.0)
pH: 5.5 (ref 5.0–8.0)

## 2013-12-07 LAB — RAPID URINE DRUG SCREEN, HOSP PERFORMED
AMPHETAMINES: NOT DETECTED
BENZODIAZEPINES: NOT DETECTED
Barbiturates: NOT DETECTED
Cocaine: NOT DETECTED
Opiates: NOT DETECTED
TETRAHYDROCANNABINOL: NOT DETECTED

## 2013-12-07 LAB — SURGICAL PCR SCREEN
MRSA, PCR: NEGATIVE
Staphylococcus aureus: NEGATIVE

## 2013-12-07 MED ORDER — IPRATROPIUM BROMIDE 0.02 % IN SOLN
0.5000 mg | Freq: Four times a day (QID) | RESPIRATORY_TRACT | Status: DC
  Administered 2013-12-07 – 2013-12-08 (×3): 0.5 mg via RESPIRATORY_TRACT
  Filled 2013-12-07 (×3): qty 2.5

## 2013-12-07 MED ORDER — METOPROLOL TARTRATE 1 MG/ML IV SOLN
2.5000 mg | INTRAVENOUS | Status: AC
  Administered 2013-12-08: 2.5 mg via INTRAVENOUS
  Filled 2013-12-07: qty 5

## 2013-12-07 MED ORDER — HYDRALAZINE HCL 20 MG/ML IJ SOLN
5.0000 mg | Freq: Four times a day (QID) | INTRAMUSCULAR | Status: DC | PRN
  Administered 2013-12-07: 5 mg via INTRAVENOUS
  Filled 2013-12-07: qty 1

## 2013-12-07 MED ORDER — MORPHINE SULFATE 2 MG/ML IJ SOLN
1.0000 mg | INTRAMUSCULAR | Status: DC | PRN
  Administered 2013-12-07 – 2013-12-08 (×2): 1 mg via INTRAVENOUS
  Filled 2013-12-07: qty 1

## 2013-12-07 MED ORDER — CEFAZOLIN SODIUM-DEXTROSE 2-3 GM-% IV SOLR
2.0000 g | INTRAVENOUS | Status: AC
  Administered 2013-12-08: 2 g via INTRAVENOUS
  Filled 2013-12-07: qty 50

## 2013-12-07 MED ORDER — MORPHINE SULFATE 2 MG/ML IJ SOLN
INTRAMUSCULAR | Status: AC
  Filled 2013-12-07: qty 1

## 2013-12-07 MED ORDER — HYDROCODONE-ACETAMINOPHEN 5-325 MG PO TABS: 1.0000 | ORAL_TABLET | ORAL | Status: DC | PRN

## 2013-12-07 NOTE — Progress Notes (Signed)
CT scan L hip reviewed by Dr. Beane, discussed with partners L femoral neck fx displaced and impacted, would recommend proceeding with hemiarthroplasty as opposed to ORIF Will plan for surgery tomorrow- L hip hemiarthroplasty- due to OR schedule today. Posted for tomorrow. Will resume heart healthy diet now, keep NPO after MN for OR tomorrow Heparin and any other anticoagulation will need to be held accordingly for OR 

## 2013-12-07 NOTE — Care Management Note (Addendum)
    Page 1 of 1   12/09/2013     4:16:06 PM CARE MANAGEMENT NOTE 12/09/2013  Patient:  [name], [name]   Account Number:  [id number]  Date Initiated:  12/07/2013  Documentation initiated by:  Lanier Clam  Subjective/Objective Assessment:   73 Y/O F ADMITTED W/FALL.L HIP FX.XL:KGMW.     Action/Plan:   FROM HOME.   Anticipated DC Date:  12/10/2013   Anticipated DC Plan:  SKILLED NURSING FACILITY      DC Planning Services  CM consult      Choice offered to / List presented to:             Status of service:  In process, will continue to follow Medicare Important Message given?  YES (If response is "NO", the following Medicare IM given date fields will be blank) Date Medicare IM given:  12/09/2013 Medicare IM given by:  [facility] Date Additional Medicare IM given:   Additional Medicare IM given by:    Discharge Disposition:    Per UR Regulation:  Reviewed for med. necessity/level of care/duration of stay  If discussed at Long Length of Stay Meetings, dates discussed:    Comments:  12/09/13 [facility] RN,BSN NCM [phone] PT-SNF.  12/08/13 Jayli Fogleman RN,BSN NCM [phone] S/P L HIP HEMI.AWAIT PT RECOMMENDATIONS.  12/07/13 Royetta Probus RN,BSN NCM [phone] ORTHO-L HEMIARTHROPLASTY IN AM.

## 2013-12-07 NOTE — Progress Notes (Signed)
PROGRESS NOTE  [name]:[id number] DOB: [birth year] DOA: 12/06/2013 PCP: Gweneth Dimitri, MD  Assessment/Plan: Left subcapital femoral neck fracture -Secondary to mechanical fall -Patient is medically stable to go to surgery from internal med stand point -appreciate Dr. Shelle Iron -Pain control Dyspnea on exertion/tobacco abuse -Likely secondary to underlying COPD -Continue bronchodilators  -tobacco cessation discussed -Nicoderm patch  Hypertension  -Continue intravenous metoprolol while the patient is n.p.o.  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  EXAM: LEFT HIP - COMPLETE 2+ VIEW  COMPARISON:  None.  FINDINGS: The bones are demineralized. There is a subcapital fracture of the left femur which appears is mildly displaced. There is no evidence of dislocation or pelvic fracture. The right femoral neck appears intact.  IMPRESSION: Mildly displaced subcapital fracture of the left femoral neck.   Electronically Signed   By: Roxy Horseman M.D.   On: 12/06/2013 18:07   Ct Chest W Contrast  12/07/2013   CLINICAL DATA:  Right hilar prominence noted on chest radiograph.  EXAM: CT CHEST WITH CONTRAST  TECHNIQUE: Multidetector CT imaging of the chest was performed during intravenous  contrast administration.  CONTRAST:  80mL OMNIPAQUE IOHEXOL 300 MG/ML  SOLN  COMPARISON:  Chest radiograph performed earlier today at 7:31 p.m., and CTA of the chest performed 08/17/2012  FINDINGS: Minimal bibasilar atelectasis is noted. The lungs are otherwise clear. No suspicious masses are seen. There is no evidence of pleural effusion or pneumothorax.  Prominence of the right hilum reflects normal vasculature. There is no evidence of mediastinal lymphadenopathy. Scattered calcification is noted at the aortic and mitral valves. The great vessels are grossly unremarkable in appearance, aside from mild calcific atherosclerotic disease. No pericardial effusion is identified.  The thyroid gland is unremarkable in appearance. No axillary lymphadenopathy is seen  Minimal hypodensities within the periphery of the liver may reflect tiny cysts, but are nonspecific in appearance. The spleen is unremarkable in appearance.  No acute osseous abnormalities are identified. Multiple mild chronic compression deformities are noted throughout the thoracic spine, most prominent at T6 and T10. These are stable from 2014. The patient is status post vertebroplasty at T6.  IMPRESSION: 1. Apparent prominence of the right hilum reflects normal vasculature. No mass is seen. 2. Minimal bibasilar atelectasis noted; lungs otherwise clear. 3. Scattered calcification at the aortic and mitral valves. 4. Question of tiny hepatic cysts. 5. Multiple mild compression deformities along the thoracic spine, relatively stable in appearance from 2014.   Electronically Signed   By: Roanna Raider M.D.   On: 12/07/2013 00:36   Dg Chest Port 1 View  12/06/2013   CLINICAL DATA:  Fall.  Smoker.  Cough.  EXAM: PORTABLE CHEST - 1 VIEW  COMPARISON:  01/19/2013  FINDINGS: Heart is mildly enlarged. There is prominence of interstitial markings. Perihilar peribronchial thickening is present. There are no focal consolidations. No pleural effusion. No pneumothorax.  There is question of a mass in the right hilar region. Further evaluation with chest CT is recommended. Intravenous contrast is recommended unless contraindicated. No acute, displaced fracture.  IMPRESSION: 1. Cardiomegaly without pulmonary edema. 2. Bronchitic changes. 3. Question of right hilar mass or adenopathy. Further evaluation with CT of the chest is recommended.   Electronically Signed   By: Rosalie Gums M.D.   On: 12/06/2013 20:00         Subjective: Patient complaint of nonproductive cough. Denies any fevers, chills, chest pain, shortness breath, nausea, vomiting, diarrhea, abdominal pain. No headaches or rashes. No dysuria.  Objective: Filed Vitals:   12/07/13 0333 12/07/13 0452 12/07/13 0601 12/07/13 0751  BP: 169/85 182/80 156/80   Pulse:  87    Temp:  98.2 F (36.8 C)    TempSrc:  Oral    Resp:  22    Height:      Weight:      SpO2:  93%  90%    Intake/Output Summary (Last 24 hours) at 12/07/13 1610 Last data filed at 12/07/13 0453  Gross per 24 hour  Intake      0 ml  Output    500 ml  Net   -500 ml   Weight change:  Exam:   General:  Pt is alert, follows commands appropriately, not in acute distress  HEENT: No icterus, No thrush,[place]/AT  Cardiovascular: RRR, S1/S2, no rubs, no gallops  Respiratory: Bilateral rales without any wheezing. Good air movement.  Abdomen: Soft/+BS, non tender, non distended, no guarding  Extremities: No edema, No lymphangitis, No petechiae, No rashes, no synovitis  Data Reviewed: Basic Metabolic Panel:  Recent Labs Lab 12/06/13 1842  NA 132*  K 3.9  CL 91*  CO2 26  GLUCOSE 86  BUN 17  CREATININE 0.61  CALCIUM 9.6   Liver Function Tests: No results found for this basename: AST, ALT, ALKPHOS, BILITOT, PROT, ALBUMIN,  in the last 168 hours No results found for this basename: LIPASE, AMYLASE,  in the last 168 hours No results found for this basename: AMMONIA,  in the last 168 hours CBC:  Recent Labs Lab  12/06/13 1842  WBC 15.9*  NEUTROABS 12.7*  HGB 14.8  HCT 44.3  MCV 89.3  PLT 443*   Cardiac Enzymes: No results found for this basename: CKTOTAL, CKMB, CKMBINDEX, TROPONINI,  in the last 168 hours BNP: No components found with this basename: POCBNP,  CBG: No results found for this basename: GLUCAP,  in the last 168 hours  No results found for this or any previous visit (from the past 240 hour(s)).   Scheduled Meds: . dextromethorphan-guaiFENesin  1 tablet Oral BID  . folic acid  1 mg Oral Daily  . heparin subcutaneous  5,000 Units Subcutaneous 3 times per day  . levalbuterol  0.63 mg Nebulization Q6H  . metoprolol  5 mg Intravenous 4 times per day  . multivitamin with minerals  1 tablet Oral Daily  . nicotine  21 mg Transdermal Daily  . thiamine  100 mg Oral Daily   Or  . thiamine  100 mg Intravenous Daily   Continuous Infusions: . sodium chloride 75 mL/hr at 12/07/13 0132     Barnard Sharps, DO  [facility] Pager [phone]  If 7PM-7AM, please contact night-coverage [email or web address] Password [email or web address] 12/07/2013, 9:07 AM  LOS: 1 day

## 2013-12-07 NOTE — Consult Note (Signed)
Reason for Consult: Left hip fracture Referring Physician: EDP   CADINCE [name] is an 73 y.o. female.   HPI: [place] on left hip yesterday. Denies any other injury from her fall and no other c/o this AM.   Past Medical History  Diagnosis Date  . Back pain   . Hypertension   . COPD (chronic obstructive pulmonary disease)   . Anxiety   . Hyperlipemia   . Compression fracture   . Hypercalcemia     Past Surgical History  Procedure Laterality Date  . Back surgery    . Dental surgery      History reviewed. No pertinent family history.  Social History:  reports that she has been smoking Cigarettes.  She has a 5 pack-year smoking history. She has never used smokeless tobacco. She reports that she drinks about 1.2 ounces of alcohol per week. She reports that she does not use illicit drugs.  Allergies: No Known Allergies  Medications: I have reviewed the patient's current medications.  Results for orders placed during the hospital encounter of 12/06/13 (from the past 48 hour(s))  BASIC METABOLIC PANEL     Status: Abnormal   Collection Time    12/06/13  6:42 PM      Result Value Ref Range   Sodium 132 (*) 137 - 147 mEq/L   Potassium 3.9  3.7 - 5.3 mEq/L   Chloride 91 (*) 96 - 112 mEq/L   CO2 26  19 - 32 mEq/L   Glucose, Bld 86  70 - 99 mg/dL   BUN 17  6 - 23 mg/dL   Creatinine, Ser 0.61  0.50 - 1.10 mg/dL   Calcium 9.6  8.4 - 10.5 mg/dL   GFR calc non Af Amer 87 (*) >90 mL/min   GFR calc Af Amer >90  >90 mL/min   Comment: (NOTE)     The eGFR has been calculated using the CKD EPI equation.     This calculation has not been validated in all clinical situations.     eGFR's persistently <90 mL/min signify possible Chronic Kidney     Disease.   Anion gap 15  5 - 15  CBC WITH DIFFERENTIAL     Status: Abnormal   Collection Time    12/06/13  6:42 PM      Result Value Ref Range   WBC 15.9 (*) 4.0 - 10.5 K/uL   RBC 4.96  3.87 - 5.11 MIL/uL   Hemoglobin 14.8  12.0 - 15.0 g/dL   HCT  44.3  36.0 - 46.0 %   MCV 89.3  78.0 - 100.0 fL   MCH 29.8  26.0 - 34.0 pg   MCHC 33.4  30.0 - 36.0 g/dL   RDW 13.8  11.5 - 15.5 %   Platelets 443 (*) 150 - 400 K/uL   Neutrophils Relative % 80 (*) 43 - 77 %   Neutro Abs 12.7 (*) 1.7 - 7.7 K/uL   Lymphocytes Relative 13  12 - 46 %   Lymphs Abs 2.0  0.7 - 4.0 K/uL   Monocytes Relative 7  3 - 12 %   Monocytes Absolute 1.1 (*) 0.1 - 1.0 K/uL   Eosinophils Relative 0  0 - 5 %   Eosinophils Absolute 0.1  0.0 - 0.7 K/uL   Basophils Relative 0  0 - 1 %   Basophils Absolute 0.0  0.0 - 0.1 K/uL  PROTIME-INR     Status: Abnormal   Collection Time  12/06/13  6:42 PM      Result Value Ref Range   Prothrombin Time 15.8 (*) 11.6 - 15.2 seconds   INR 1.26  0.00 - 1.49  TYPE AND SCREEN     Status: None   Collection Time    12/06/13  6:42 PM      Result Value Ref Range   ABO/RH(D) O POS     Antibody Screen NEG     Sample Expiration 12/09/2013    ABO/RH     Status: None   Collection Time    12/06/13  6:47 PM      Result Value Ref Range   ABO/RH(D) O POS    ETHANOL     Status: None   Collection Time    12/06/13 10:41 PM      Result Value Ref Range   Alcohol, Ethyl (B) <11  0 - 11 mg/dL   Comment:            LOWEST DETECTABLE LIMIT FOR     SERUM ALCOHOL IS 11 mg/dL     FOR MEDICAL PURPOSES ONLY    Dg Hip Complete Left  12/06/2013   CLINICAL DATA:  Left hip pain after falling today.  EXAM: LEFT HIP - COMPLETE 2+ VIEW  COMPARISON:  None.  FINDINGS: The bones are demineralized. There is a subcapital fracture of the left femur which appears is mildly displaced. There is no evidence of dislocation or pelvic fracture. The right femoral neck appears intact.  IMPRESSION: Mildly displaced subcapital fracture of the left femoral neck.   Electronically Signed   By: Camie Patience M.D.   On: 12/06/2013 18:07   Dg Chest Port 1 View  12/06/2013   CLINICAL DATA:  Fall.  Smoker.  Cough.  EXAM: PORTABLE CHEST - 1 VIEW  COMPARISON:  01/19/2013  FINDINGS:  Heart is mildly enlarged. There is prominence of interstitial markings. Perihilar peribronchial thickening is present. There are no focal consolidations. No pleural effusion. No pneumothorax. There is question of a mass in the right hilar region. Further evaluation with chest CT is recommended. Intravenous contrast is recommended unless contraindicated. No acute, displaced fracture.  IMPRESSION: 1. Cardiomegaly without pulmonary edema. 2. Bronchitic changes. 3. Question of right hilar mass or adenopathy. Further evaluation with CT of the chest is recommended.   Electronically Signed   By: Shon Hale M.D.   On: 12/06/2013 20:00    Review of Systems  Musculoskeletal: Positive for joint pain.  All other systems reviewed and are negative.  Blood pressure 178/88, pulse 92, temperature 98.1 F (36.7 C), temperature source Oral, resp. rate 16, SpO2 92.00%. Physical Exam  Constitutional: She appears well-developed.  HENT:  Head: Normocephalic.  Eyes: Pupils are equal, round, and reactive to light.  Neck: Normal range of motion.  Cardiovascular: Normal rate.   Respiratory: Effort normal.  GI: Soft.  Musculoskeletal:  Pain with motion left hip. NVI. Compartments soft.  Neurological: She is alert.  Skin: Skin is warm and dry.  Psychiatric: She has a normal mood and affect.    Assessment/Plan: Left hip femoral neck fracture Plan ORIF vs hemiarthroplasty after medical clearance CT scan hip to evaluate fx pattern and likely displacement which would require hemi Risks discussed  Remain NPO for surgery later today  Lacie Draft PA-C for BEANE,JEFFREY C 12/07/2013, 12:13 AM

## 2013-12-07 NOTE — Progress Notes (Signed)
Echo Lab  2D Echocardiogram completed.  Lional Icenogle L Chanah Tidmore, RDCS 12/07/2013 11:24 AM

## 2013-12-07 NOTE — Progress Notes (Signed)
INITIAL NUTRITION ASSESSMENT  DOCUMENTATION CODES Per approved criteria  -Non-severe (moderate) malnutrition in the context of chronic illness  Pt meets criteria for moderate MALNUTRITION in the context of chronic illness as evidenced by moderate muscle wasting and subcutaneous fat loss, PO intake likely < 75% for > one month.   INTERVENTION: -Recommend Ensure Complete once daily -Consider Dys3/chopped meats once diet advanced d/t recent dental surgery -Encouraged intake of protein/kcal snacks -RD to follow  NUTRITION DIAGNOSIS: Increased nutrient needs (protein/kcal) related to increased demand for nutrients as evidenced by BMI < 18.5, hip fx.   Goal: Pt to meet >/= 90% of their estimated nutrition needs    Monitor:  Total protein/energy intake, labs, weights, diet order, dental profile  Reason for Assessment: Underweight BMI/MST  73 y.o. female  Admitting Dx: Fracture of hip, left, open  ASSESSMENT: [name] is a 73 y.o. female PMHx anxiety, HTN, HLD COPD, chronic hypoxic respiratory failure, chronic hyponatremia, chronic back pain. Presented reporting that she reports tripping over her shoes causing her to fall at approximately 1530  -Pt confused, restless, eager for diet advancement. Food hx obtained from home health nurse -Home Health nurse reported pt with decreased intake for past 2-3 days d/t recent dental surgery. Had bottom teeth extracted and has complained of mouth soreness -Diet recall indicates pt consumes cereal and coffee for breakfast, lunch typically sandwich or half of 6" sub, and dinner is a light meal of seafood salad or shrimp. Cannot tolerate hot foods. Snacks on candies and sweets. Consumes three glasses of wine daily, which MD noted likely attributes to pt frequent falls -Nurse denied any changes in weight; reported pt has always been small framed; however hx of ETOH abuse, and muscle and fat wasting indicate pt likely some degree of  malnourished -NPO for possible ORIF vs hemiarthroplasty. Discussed with RN importance of balanced snacks and nutrition for post op. Will order Ensure Complete once daily. Would also benefit from softer diet d/t recent dental extractions Nutrition Focused Physical Exam:  Subcutaneous Fat:  Orbital Region: moderate wasting Upper Arm Region: moderate Thoracic and Lumbar Region: n/a  Muscle:  Temple Region: moderate Clavicle Bone Region: moderate Clavicle and Acromion Bone Region: moderate Scapular Bone Region: moderate Dorsal Hand: severe Patellar Region: WDL Anterior Thigh Region: moderate Posterior Calf Region: moderate  Edema: none noted    Height: Ht Readings from Last 1 Encounters:  12/07/13  (1.702 m)    Weight: Wt Readings from Last 1 Encounters:  12/07/13 107 lb 12.9 oz (48.9 kg)    Ideal Body Weight: 135 lbs  % Ideal Body Weight: 79%  Wt Readings from Last 10 Encounters:  12/07/13 107 lb 12.9 oz (48.9 kg)  09/16/12 111 lb 9.6 oz (50.621 kg)  08/23/12 101 lb 9.6 oz (46.085 kg)    Usual Body Weight: 110 lbs per previous medical records  % Usual Body Weight: 97%  BMI:  Body mass index is 16.88 kg/(m^2).  Estimated Nutritional Needs: Kcal: 1500-1700 Protein: 60-70 gram Fluid: >/=1500 ml/daily  Skin: WDL  Diet Order: NPO  EDUCATION NEEDS: -Education needs addressed   Intake/Output Summary (Last 24 hours) at 12/07/13 1124 Last data filed at 12/07/13 0453  Gross per 24 hour  Intake      0 ml  Output    500 ml  Net   -500 ml    Last BM: 9/14   Labs:   Recent Labs Lab 12/06/13 1842  NA 132*  K 3.9  CL 91*  CO2 26  BUN 17  CREATININE 0.61  CALCIUM 9.6  GLUCOSE 86    CBG (last 3)  No results found for this basename: GLUCAP,  in the last 72 hours  Scheduled Meds: . dextromethorphan-guaiFENesin  1 tablet Oral BID  . folic acid  1 mg Oral Daily  . heparin subcutaneous  5,000 Units Subcutaneous 3 times per day  . levalbuterol   0.63 mg Nebulization Q6H  . metoprolol  5 mg Intravenous 4 times per day  . multivitamin with minerals  1 tablet Oral Daily  . nicotine  21 mg Transdermal Daily  . thiamine  100 mg Oral Daily   Or  . thiamine  100 mg Intravenous Daily    Continuous Infusions: . sodium chloride 75 mL/hr at 12/07/13 0132    Past Medical History  Diagnosis Date  . Back pain   . Hypertension   . COPD (chronic obstructive pulmonary disease)   . Anxiety   . Hyperlipemia   . Compression fracture   . Hypercalcemia     Past Surgical History  Procedure Laterality Date  . Back surgery    . Dental surgery      Lloyd Huger MS RD LDN Clinical Dietitian [phone]

## 2013-12-08 ENCOUNTER — Inpatient Hospital Stay (HOSPITAL_COMMUNITY): Payer: Medicare Other

## 2013-12-08 ENCOUNTER — Encounter (HOSPITAL_COMMUNITY)
Admission: EM | Disposition: A | Discharge status: Skilled Nursing Facility | Payer: Medicare Other | Source: Home / Self Care | Attending: Internal Medicine

## 2013-12-08 ENCOUNTER — Inpatient Hospital Stay (HOSPITAL_COMMUNITY): Payer: Medicare Other | Admitting: Anesthesiology

## 2013-12-08 ENCOUNTER — Encounter (HOSPITAL_COMMUNITY): Payer: Medicare Other | Admitting: Anesthesiology

## 2013-12-08 DIAGNOSIS — IMO0002 Reserved for concepts with insufficient information to code with codable children: Secondary | ICD-10-CM

## 2013-12-08 HISTORY — PX: HIP ARTHROPLASTY: SHX981

## 2013-12-08 LAB — COMPREHENSIVE METABOLIC PANEL
ALBUMIN: 3 g/dL — AB (ref 3.5–5.2)
ALT: 20 U/L (ref 0–35)
ANION GAP: 18 — AB (ref 5–15)
AST: 17 U/L (ref 0–37)
Alkaline Phosphatase: 88 U/L (ref 39–117)
BILIRUBIN TOTAL: 0.7 mg/dL (ref 0.3–1.2)
BUN: 10 mg/dL (ref 6–23)
CALCIUM: 9.2 mg/dL (ref 8.4–10.5)
CHLORIDE: 95 meq/L — AB (ref 96–112)
CO2: 20 mEq/L (ref 19–32)
CREATININE: 0.45 mg/dL — AB (ref 0.50–1.10)
GFR calc Af Amer: >90 mL/min (ref >90)
GFR calc non Af Amer: >90 mL/min (ref >90)
Glucose, Bld: 66 mg/dL — ABNORMAL LOW (ref 70–99)
Potassium: 3.7 mEq/L (ref 3.7–5.3)
Sodium: 133 mEq/L — ABNORMAL LOW (ref 137–147)
Total Protein: 6.5 g/dL (ref 6.0–8.3)

## 2013-12-08 LAB — CBC
HCT: 45.2 % (ref 36.0–46.0)
HCT: 44.5 % (ref 36.0–46.0)
Hemoglobin: 15.1 g/dL — ABNORMAL HIGH (ref 12.0–15.0)
Hemoglobin: 15.1 g/dL — ABNORMAL HIGH (ref 12.0–15.0)
MCH: 29.9 pg (ref 26.0–34.0)
MCH: 30.1 pg (ref 26.0–34.0)
MCHC: 33.4 g/dL (ref 30.0–36.0)
MCHC: 33.9 g/dL (ref 30.0–36.0)
MCV: 89.5 fL (ref 78.0–100.0)
MCV: 88.6 fL (ref 78.0–100.0)
PLATELETS: 393 10*3/uL (ref 150–400)
Platelets: 393 10*3/uL (ref 150–400)
RBC: 5.05 MIL/uL (ref 3.87–5.11)
RBC: 5.02 MIL/uL (ref 3.87–5.11)
RDW: 13.8 % (ref 11.5–15.5)
RDW: 13.9 % (ref 11.5–15.5)
WBC: 14.7 10*3/uL — ABNORMAL HIGH (ref 4.0–10.5)
WBC: 24.2 10*3/uL — AB (ref 4.0–10.5)

## 2013-12-08 LAB — URINE CULTURE
COLONY COUNT: NO GROWTH
CULTURE: NO GROWTH

## 2013-12-08 LAB — CREATININE, SERUM
Creatinine, Ser: 0.48 mg/dL — ABNORMAL LOW (ref 0.50–1.10)
GFR calc Af Amer: >90 mL/min (ref >90)
GFR calc non Af Amer: >90 mL/min (ref >90)

## 2013-12-08 SURGERY — HEMIARTHROPLASTY, HIP, DIRECT ANTERIOR APPROACH, FOR FRACTURE
Anesthesia: General | Site: Hip | Laterality: Left

## 2013-12-08 MED ORDER — ONDANSETRON HCL 4 MG/2ML IJ SOLN: 4.0000 mg | Freq: Four times a day (QID) | INTRAMUSCULAR | Status: DC | PRN

## 2013-12-08 MED ORDER — MENTHOL 3 MG MT LOZG
1.0000 | LOZENGE | OROMUCOSAL | Status: DC | PRN
  Filled 2013-12-08: qty 9

## 2013-12-08 MED ORDER — SODIUM CHLORIDE 0.9 % IJ SOLN
INTRAMUSCULAR | Status: AC
  Filled 2013-12-08: qty 10

## 2013-12-08 MED ORDER — PHENYLEPHRINE HCL 10 MG/ML IJ SOLN
INTRAMUSCULAR | Status: AC
  Filled 2013-12-08: qty 1

## 2013-12-08 MED ORDER — NEOSTIGMINE METHYLSULFATE 10 MG/10ML IV SOLN
INTRAVENOUS | Status: DC | PRN
  Administered 2013-12-08: 4 mg via INTRAVENOUS

## 2013-12-08 MED ORDER — CISATRACURIUM BESYLATE 20 MG/10ML IV SOLN
INTRAVENOUS | Status: AC
  Filled 2013-12-08: qty 10

## 2013-12-08 MED ORDER — HYDROCODONE-ACETAMINOPHEN 5-325 MG PO TABS
1.0000 | ORAL_TABLET | Freq: Four times a day (QID) | ORAL | Status: DC | PRN
  Administered 2013-12-08 – 2013-12-12 (×5): 1 via ORAL
  Filled 2013-12-08 (×5): qty 1

## 2013-12-08 MED ORDER — LACTATED RINGERS IV SOLN
INTRAVENOUS | Status: DC
  Administered 2013-12-08: 1000 mL via INTRAVENOUS

## 2013-12-08 MED ORDER — DOCUSATE SODIUM 100 MG PO CAPS
100.0000 mg | ORAL_CAPSULE | Freq: Two times a day (BID) | ORAL | Status: DC
  Administered 2013-12-08 – 2013-12-12 (×9): 100 mg via ORAL
  Filled 2013-12-08 (×11): qty 1

## 2013-12-08 MED ORDER — MEPERIDINE HCL 50 MG/ML IJ SOLN: 6.2500 mg | INTRAMUSCULAR | Status: DC | PRN

## 2013-12-08 MED ORDER — MIDAZOLAM HCL 2 MG/2ML IJ SOLN
INTRAMUSCULAR | Status: AC
  Filled 2013-12-08: qty 2

## 2013-12-08 MED ORDER — FENTANYL CITRATE 0.05 MG/ML IJ SOLN
INTRAMUSCULAR | Status: AC
  Filled 2013-12-08: qty 2

## 2013-12-08 MED ORDER — METOPROLOL TARTRATE 25 MG PO TABS
25.0000 mg | ORAL_TABLET | Freq: Two times a day (BID) | ORAL | Status: DC
  Administered 2013-12-08 – 2013-12-12 (×8): 25 mg via ORAL
  Filled 2013-12-08 (×9): qty 1

## 2013-12-08 MED ORDER — FENTANYL CITRATE 0.05 MG/ML IJ SOLN
INTRAMUSCULAR | Status: DC | PRN
  Administered 2013-12-08 (×3): 50 ug via INTRAVENOUS

## 2013-12-08 MED ORDER — OXYCODONE HCL 5 MG/5ML PO SOLN: 5.0000 mg | Freq: Once | ORAL | Status: DC | PRN

## 2013-12-08 MED ORDER — SODIUM CHLORIDE 0.9 % IR SOLN
Status: AC
  Filled 2013-12-08: qty 1

## 2013-12-08 MED ORDER — PROPOFOL 10 MG/ML IV BOLUS
INTRAVENOUS | Status: AC
  Filled 2013-12-08: qty 20

## 2013-12-08 MED ORDER — SODIUM CHLORIDE 0.9 % IV SOLN: INTRAVENOUS | Status: DC

## 2013-12-08 MED ORDER — ACETAMINOPHEN 325 MG PO TABS
650.0000 mg | ORAL_TABLET | Freq: Four times a day (QID) | ORAL | Status: DC | PRN
  Administered 2013-12-11: 650 mg via ORAL
  Filled 2013-12-08: qty 2

## 2013-12-08 MED ORDER — ENOXAPARIN SODIUM 30 MG/0.3ML ~~LOC~~ SOLN
30.0000 mg | SUBCUTANEOUS | Status: DC
  Administered 2013-12-09: 30 mg via SUBCUTANEOUS
  Filled 2013-12-08 (×2): qty 0.3

## 2013-12-08 MED ORDER — HYDROMORPHONE HCL 1 MG/ML IJ SOLN: 0.2500 mg | INTRAMUSCULAR | Status: DC | PRN

## 2013-12-08 MED ORDER — GLYCOPYRROLATE 0.2 MG/ML IJ SOLN
INTRAMUSCULAR | Status: AC
  Filled 2013-12-08: qty 2

## 2013-12-08 MED ORDER — PROPOFOL 10 MG/ML IV BOLUS
INTRAVENOUS | Status: DC | PRN
  Administered 2013-12-08: 100 mg via INTRAVENOUS

## 2013-12-08 MED ORDER — PHENOL 1.4 % MT LIQD
1.0000 | OROMUCOSAL | Status: DC | PRN
  Filled 2013-12-08: qty 177

## 2013-12-08 MED ORDER — METOCLOPRAMIDE HCL 10 MG PO TABS: 5.0000 mg | ORAL_TABLET | Freq: Three times a day (TID) | ORAL | Status: DC | PRN

## 2013-12-08 MED ORDER — ONDANSETRON HCL 4 MG/2ML IJ SOLN
INTRAMUSCULAR | Status: DC | PRN
  Administered 2013-12-08: 4 mg via INTRAVENOUS

## 2013-12-08 MED ORDER — ONDANSETRON HCL 4 MG PO TABS: 4.0000 mg | ORAL_TABLET | Freq: Four times a day (QID) | ORAL | Status: DC | PRN

## 2013-12-08 MED ORDER — CEFAZOLIN SODIUM-DEXTROSE 2-3 GM-% IV SOLR
2.0000 g | Freq: Four times a day (QID) | INTRAVENOUS | Status: AC
  Administered 2013-12-08 – 2013-12-09 (×3): 2 g via INTRAVENOUS
  Filled 2013-12-08 (×4): qty 50

## 2013-12-08 MED ORDER — DOCUSATE SODIUM 100 MG PO CAPS: 100.0000 mg | ORAL_CAPSULE | Freq: Two times a day (BID) | ORAL | Status: DC

## 2013-12-08 MED ORDER — HYDROCODONE-ACETAMINOPHEN 5-325 MG PO TABS: 1.0000 | ORAL_TABLET | Freq: Four times a day (QID) | ORAL | Status: DC | PRN

## 2013-12-08 MED ORDER — PHENYLEPHRINE 40 MCG/ML (10ML) SYRINGE FOR IV PUSH (FOR BLOOD PRESSURE SUPPORT)
PREFILLED_SYRINGE | INTRAVENOUS | Status: AC
  Filled 2013-12-08: qty 10

## 2013-12-08 MED ORDER — METHOCARBAMOL 500 MG PO TABS: 500.0000 mg | ORAL_TABLET | Freq: Four times a day (QID) | ORAL | Status: DC | PRN

## 2013-12-08 MED ORDER — HYDRALAZINE HCL 20 MG/ML IJ SOLN
INTRAMUSCULAR | Status: AC
  Filled 2013-12-08: qty 1

## 2013-12-08 MED ORDER — ENOXAPARIN SODIUM 30 MG/0.3ML ~~LOC~~ SOLN: 30.0000 mg | SUBCUTANEOUS | Status: DC

## 2013-12-08 MED ORDER — MORPHINE SULFATE 2 MG/ML IJ SOLN
0.5000 mg | INTRAMUSCULAR | Status: DC | PRN
Start: 2013-12-08 — End: 2013-12-12

## 2013-12-08 MED ORDER — GLYCOPYRROLATE 0.2 MG/ML IJ SOLN
INTRAMUSCULAR | Status: DC | PRN
  Administered 2013-12-08: 0.4 mg via INTRAVENOUS

## 2013-12-08 MED ORDER — MAGNESIUM CITRATE PO SOLN: 1.0000 | Freq: Once | ORAL | Status: AC | PRN

## 2013-12-08 MED ORDER — CISATRACURIUM BESYLATE (PF) 10 MG/5ML IV SOLN
INTRAVENOUS | Status: DC | PRN
  Administered 2013-12-08: 5 mg via INTRAVENOUS
  Administered 2013-12-08: 2 mg via INTRAVENOUS

## 2013-12-08 MED ORDER — ACETAMINOPHEN 650 MG RE SUPP: 650.0000 mg | Freq: Four times a day (QID) | RECTAL | Status: DC | PRN

## 2013-12-08 MED ORDER — CEFAZOLIN SODIUM-DEXTROSE 2-3 GM-% IV SOLR
INTRAVENOUS | Status: AC
  Filled 2013-12-08: qty 50

## 2013-12-08 MED ORDER — SODIUM CHLORIDE 0.9 % IV SOLN
INTRAVENOUS | Status: DC | PRN
  Administered 2013-12-08: 10:00:00 via INTRAVENOUS

## 2013-12-08 MED ORDER — LEVALBUTEROL HCL 0.63 MG/3ML IN NEBU
0.6300 mg | INHALATION_SOLUTION | Freq: Four times a day (QID) | RESPIRATORY_TRACT | Status: DC | PRN
  Administered 2013-12-11: 0.63 mg via RESPIRATORY_TRACT
  Filled 2013-12-08: qty 3

## 2013-12-08 MED ORDER — PROMETHAZINE HCL 25 MG/ML IJ SOLN: 6.2500 mg | INTRAMUSCULAR | Status: DC | PRN

## 2013-12-08 MED ORDER — SENNOSIDES-DOCUSATE SODIUM 8.6-50 MG PO TABS: 1.0000 | ORAL_TABLET | Freq: Every evening | ORAL | Status: DC | PRN

## 2013-12-08 MED ORDER — POLYMYXIN B SULFATE 500000 UNITS IJ SOLR
INTRAMUSCULAR | Status: DC | PRN
  Administered 2013-12-08: 11:00:00

## 2013-12-08 MED ORDER — OXYCODONE HCL 5 MG PO TABS: 5.0000 mg | ORAL_TABLET | Freq: Once | ORAL | Status: DC | PRN

## 2013-12-08 MED ORDER — METHOCARBAMOL 1000 MG/10ML IJ SOLN
500.0000 mg | Freq: Four times a day (QID) | INTRAVENOUS | Status: DC | PRN
  Filled 2013-12-08: qty 5

## 2013-12-08 MED ORDER — BISACODYL 5 MG PO TBEC: 5.0000 mg | DELAYED_RELEASE_TABLET | Freq: Every day | ORAL | Status: DC | PRN

## 2013-12-08 MED ORDER — SODIUM CHLORIDE 0.9 % IV SOLN
INTRAVENOUS | Status: DC
  Administered 2013-12-08: 17:00:00 via INTRAVENOUS
  Filled 2013-12-08 (×3): qty 1000

## 2013-12-08 MED ORDER — ONDANSETRON HCL 4 MG/2ML IJ SOLN
INTRAMUSCULAR | Status: AC
  Filled 2013-12-08: qty 2

## 2013-12-08 MED ORDER — METOCLOPRAMIDE HCL 5 MG/ML IJ SOLN: 5.0000 mg | Freq: Three times a day (TID) | INTRAMUSCULAR | Status: DC | PRN

## 2013-12-08 SURGICAL SUPPLY — 46 items
BAG ZIPLOCK 12X15 (MISCELLANEOUS) IMPLANT
BLADE SAW SGTL 18X1.27X75 (BLADE) ×2 IMPLANT
BLADE SAW SGTL 18X1.27X75MM (BLADE) ×1
BRUSH FEMORAL CANAL (MISCELLANEOUS) IMPLANT
CAPT HIP HD POR BIPOL/UNIPOL ×3 IMPLANT
CHLORAPREP W/TINT 26ML (MISCELLANEOUS) IMPLANT
CLOTH 2% CHLOROHEXIDINE 3PK (PERSONAL CARE ITEMS) ×3 IMPLANT
DRAPE INCISE IOBAN 66X45 STRL (DRAPES) IMPLANT
DRAPE POUCH INSTRU U-SHP 10X18 (DRAPES) ×3 IMPLANT
DRAPE SPLIT 77X100IN (DRAPES) ×3 IMPLANT
DRAPE U-SHAPE 47X51 STRL (DRAPES) ×3 IMPLANT
DRSG AQUACEL AG ADV 3.5X10 (GAUZE/BANDAGES/DRESSINGS) ×3 IMPLANT
DRSG EMULSION OIL 3X16 NADH (GAUZE/BANDAGES/DRESSINGS) IMPLANT
DRSG PAD ABDOMINAL 8X10 ST (GAUZE/BANDAGES/DRESSINGS) IMPLANT
DURAPREP 26ML APPLICATOR (WOUND CARE) ×3 IMPLANT
ELECT REM PT RETURN 9FT ADLT (ELECTROSURGICAL) ×3
ELECTRODE REM PT RTRN 9FT ADLT (ELECTROSURGICAL) ×1 IMPLANT
EVACUATOR 1/8 PVC DRAIN (DRAIN) IMPLANT
FACESHIELD WRAPAROUND (MASK) ×6 IMPLANT
GAUZE SPONGE 4X4 12PLY STRL (GAUZE/BANDAGES/DRESSINGS) IMPLANT
GLOVE BIOGEL PI IND STRL 7.5 (GLOVE) ×1 IMPLANT
GLOVE BIOGEL PI IND STRL 8 (GLOVE) ×1 IMPLANT
GLOVE BIOGEL PI INDICATOR 7.5 (GLOVE) ×2
GLOVE BIOGEL PI INDICATOR 8 (GLOVE) ×2
GLOVE SURG SS PI 7.5 STRL IVOR (GLOVE) ×3 IMPLANT
GLOVE SURG SS PI 8.0 STRL IVOR (GLOVE) ×3 IMPLANT
GOWN STRL REUS W/TWL XL LVL3 (GOWN DISPOSABLE) ×6 IMPLANT
HANDPIECE INTERPULSE COAX TIP (DISPOSABLE)
IMMOBILIZER KNEE 20 (SOFTGOODS) ×3
IMMOBILIZER KNEE 20 THIGH 36 (SOFTGOODS) ×1 IMPLANT
KIT BASIN OR (CUSTOM PROCEDURE TRAY) ×3 IMPLANT
MANIFOLD NEPTUNE II (INSTRUMENTS) ×3 IMPLANT
PACK TOTAL JOINT (CUSTOM PROCEDURE TRAY) ×3 IMPLANT
POSITIONER SURGICAL ARM (MISCELLANEOUS) ×3 IMPLANT
SET HNDPC FAN SPRY TIP SCT (DISPOSABLE) IMPLANT
STAPLER VISISTAT (STAPLE) IMPLANT
SUT ETHIBOND NAB CT1 #1 30IN (SUTURE) ×12 IMPLANT
SUT VIC AB 0 CT1 27 (SUTURE)
SUT VIC AB 0 CT1 27XBRD ANTBC (SUTURE) IMPLANT
SUT VIC AB 1 CT1 27 (SUTURE) ×4
SUT VIC AB 1 CT1 27XBRD ANTBC (SUTURE) ×2 IMPLANT
SUT VIC AB 2-0 CT1 27 (SUTURE) ×4
SUT VIC AB 2-0 CT1 TAPERPNT 27 (SUTURE) ×2 IMPLANT
SUT VLOC 180 0 24IN GS25 (SUTURE) ×3 IMPLANT
TOWER CARTRIDGE SMART MIX (DISPOSABLE) IMPLANT
WATER STERILE IRR 1500ML POUR (IV SOLUTION) ×3 IMPLANT

## 2013-12-08 NOTE — Transfer of Care (Signed)
Immediate Anesthesia Transfer of Care Note  Patient: [name]  Procedure(s) Performed: Procedure(s): LEFT HIP HEMI ARTHROPLASTY  (Left)  Patient Location: PACU  Anesthesia Type:General  Level of Consciousness: awake, sedated and patient cooperative  Airway & Oxygen Therapy: Patient Spontanous Breathing and Patient connected to face mask oxygen  Post-op Assessment: Report given to PACU RN and Post -op Vital signs reviewed and stable  Post vital signs: Reviewed and stable  Complications: No apparent anesthesia complications

## 2013-12-08 NOTE — Anesthesia Postprocedure Evaluation (Signed)
Anesthesia Post Note  Patient: [name]  Procedure(s) Performed: Procedure(s) (LRB): LEFT HIP HEMI ARTHROPLASTY  (Left)  Anesthesia type: General  Patient location: PACU  Post pain: Pain level controlled  Post assessment: Post-op Vital signs reviewed  Last Vitals: BP 113/60  Pulse 84  Temp(Src) 36.9 C (Oral)  Resp 17  Ht  (1.702 m)  Wt 107 lb 12.9 oz (48.9 kg)  BMI 16.88 kg/m2  SpO2 95%  Post vital signs: Reviewed  Level of consciousness: sedated  Complications: No apparent anesthesia complications

## 2013-12-08 NOTE — Progress Notes (Signed)
BP remained elevated-Lopressor 2.5 mg given as ordered by NP. Will cont to monitor. BP resting and breathing has improved since IVF decreased to  KVO rate. Srp, RN

## 2013-12-08 NOTE — Op Note (Signed)
NAMEANNESSA, SATRE                ACCOUNT NO.:  [id number]  MEDICAL RECORD NO.:  [id number]  LOCATION:  1445                         FACILITY:  [facility]  PHYSICIAN:  Jene Every, M.D.    DATE OF BIRTH:  [birth year]  DATE OF PROCEDURE:  12/08/2013 DATE OF DISCHARGE:                              OPERATIVE REPORT   PREOPERATIVE DIAGNOSIS:  Left femoral neck fracture, displaced.  POSTOPERATIVE DIAGNOSIS:  Left femoral neck fracture, displaced.  PROCEDURE PERFORMED:  Left hip hemiarthroplasty.  ANESTHESIA:  General.  ASSISTANT:  Lanna Poche, PA.  COMPONENTS:  DePuy AML 10.5, 47 bipolar assembly.  ANESTHESIA:  General.  BRIEF HISTORY:  A 73 year old female patient, fell, sustained a fracture.  We felt it was displaced.  She has a history of alcohol abuse.  We felt any attempt to allow reduction and fracture fixation was prone to failure.  We therefore proceeded with a hemiarthroplasty.  Risk and benefits were discussed including bleeding, infection, DVT, PE, anesthetic complications, dislocation, etc.  TECHNIQUE:  The patient in supine position after the induction of adequate general anesthesia, 2 g Kefzol, placed in a right lateral decubitus position with a hip holder.  All bony prominences were well padded.  Left hip peritrochanteric region was prepped and draped in usual sterile fashion.  Standard posterolateral approach to the hip was utilized.  I made an incision based over the trochanter.  Subcutaneous tissue was dissected.  Electrocautery was utilized to achieve hemostasis.  The fascia was identified and divided in line with the skin incision.  Self-retaining retractor was placed.  Bursectomy performed, identified the piriformis, detached it from its insertion, reflected it posteriorly to protect the sciatic nerve throughout the case.  I identified the capsule, performed a T-shaped capsulotomy, fracture hematoma expressed, fracture noted, tagged the capsule and  dislocated the hip, performed an osteotomy one fingerbreadth above the lesser trochanter.  Entered the canal with a canal finer box, chisel, and a reamer.  Fairly tight canals were reamed to a 10.5 with good bicortical contact.  I then broached it to 10.5 in the appropriate version. Following this, I removed the head, measured to a 47, trialed optimally to a 47, debrided the acetabulum, cauterized the vessels, irrigated the wound.  I impacted the 10.5 AML with excellent stability in the appropriate version.  A +1 neck and a 47 bipolar assembly was impacted onto the trunnion, reduced, and it was found to be stable throughout a full range of motion and good leg lengths.  No active bleeding.  I copiously irrigated the wound.  I closed the capsule with 1 Ethibond. The fascia with #1 Ethibond and a running V-Loc, subcu with 2-0, and skin with staples.  Wound was dressed sterilely.  The patient was placed supine on the hospital bed.  Leg lengths were equivalent, good pulses.  Knee immobilizer placed, extubated without difficulty, and transported to the recovery room in satisfactory condition.  The patient tolerated the procedure well.  No complications.  Blood loss, 50 mL.     Jene Every, M.D.     Cordelia Pen  D:  12/08/2013  T:  12/08/2013  Job:  [id number]

## 2013-12-08 NOTE — Discharge Instructions (Signed)
Partial weight bearing left leg with walker for assistance (50% weight bearing) Knee immobilizer to remain in place while in bed to prevent dislocation Posterior hip precautions to prevent dislocation Aquacel dressing may remain in place until follow up. May shower with aquacel dressing in place but do not soak/submerge in water. Pat dry after showering. If the dressing becomes saturated or peels off prior to follow up, may remove and place gauze and tape dressing which should be kept clean and dry and changed daily. Follow up 10-14 days post-op with Dr. Shelle Iron for staple removal and xrays

## 2013-12-08 NOTE — Brief Op Note (Signed)
12/06/2013 - 12/08/2013  11:53 AM  PATIENT:  Webb Silversmith  73 y.o. female  PRE-OPERATIVE DIAGNOSIS:  left hip fracture  POST-OPERATIVE DIAGNOSIS:  left hip fracture  PROCEDURE:  Procedure(s): LEFT HIP HEMI ARTHROPLASTY  (Left)  SURGEON:  Surgeon(s) and Role:    * Javier Docker, MD - Primary  PHYSICIAN ASSISTANT:   ASSISTANTS: Bissell   ANESTHESIA:   general  EBL:  Total I/O In: -  Out: 200 [Urine:200]  BLOOD ADMINISTERED:none  DRAINS: none   LOCAL MEDICATIONS USED:  MARCAINE     SPECIMEN:  No Specimen  DISPOSITION OF SPECIMEN:  N/A  COUNTS:  YES  TOURNIQUET:  * No tourniquets in log *  DICTATION: .Other Dictation: Dictation Number [id number]  PLAN OF CARE: Admit to inpatient   PATIENT DISPOSITION:  PACU - hemodynamically stable.   Delay start of Pharmacological VTE agent (>24hrs) due to surgical blood loss or risk of bleeding: no

## 2013-12-08 NOTE — H&P (View-Only) (Signed)
CT scan L hip reviewed by Dr. Shelle Iron, discussed with partners L femoral neck fx displaced and impacted, would recommend proceeding with hemiarthroplasty as opposed to ORIF Will plan for surgery tomorrow- L hip hemiarthroplasty- due to OR schedule today. Posted for tomorrow. Will resume heart healthy diet now, keep NPO after MN for OR tomorrow Heparin and any other anticoagulation will need to be held accordingly for OR

## 2013-12-08 NOTE — Progress Notes (Signed)
X-ray results noted. Dr. Renold Don called- aware of portable chest x-ray results.

## 2013-12-08 NOTE — Anesthesia Preprocedure Evaluation (Addendum)
Anesthesia Evaluation  Patient identified by MRN, date of birth, ID band Patient awake    Reviewed: Allergy & Precautions, H&P , NPO status , Patient's Chart, lab work & pertinent test results  Airway Mallampati: II TM Distance: >3 FB Neck ROM: Full    Dental no notable dental hx.    Pulmonary COPD COPD inhaler, Current Smoker,  + rhonchi     rales    Cardiovascular hypertension, Pt. on medications Rhythm:Regular Rate:Normal  Echo 12/06/2013 - Left ventricle: The cavity size was normal. Wall thickness was normal. Systolic function was vigorous. The estimated ejection fraction was in the range of 65% to 70%. There was a LV mid cavity gradient, peak 44 mmHg. Wall motion was normal; there were no regional wall motion abnormalities. Doppler parameters are consistent with abnormal left ventricular relaxation (grade 1 diastolic dysfunction). - Aortic valve: There was no stenosis. - Mitral valve: Mildly to moderately calcified annulus. Normal thickness leaflets . There was no significant regurgitation. - Right ventricle: The cavity size was normal. Systolic function was normal. - Pulmonary arteries: No complete TR doppler jet so unable to estimate PA systolic pressure. - Systemic veins: IVC not visualized.  Impressions:  - Normal LV size with mild LV hypertrophy, EF 65-70%. There was a  LV mid-cavity gradient to peak 44 mmHg. Normal RV size and  systolic function. No significant valvular abnormalities. Study  terminated early due to agitation.    Neuro/Psych PSYCHIATRIC DISORDERS Anxiety negative neurological ROS     GI/Hepatic negative GI ROS, Neg liver ROS,   Endo/Other  negative endocrine ROS  Renal/GU negative Renal ROS     Musculoskeletal negative musculoskeletal ROS (+)   Abdominal   Peds  Hematology negative hematology ROS (+)   Anesthesia Other Findings   Reproductive/Obstetrics negative OB ROS                         Anesthesia Physical Anesthesia Plan  ASA: III  Anesthesia Plan: General   Post-op Pain Management:    Induction: Intravenous  Airway Management Planned: Oral ETT  Additional Equipment:   Intra-op Plan:   Post-operative Plan: Extubation in OR  Informed Consent: I have reviewed the patients History and Physical, chart, labs and discussed the procedure including the risks, benefits and alternatives for the proposed anesthesia with the patient or authorized representative who has indicated his/her understanding and acceptance.   Dental advisory given  Plan Discussed with: CRNA  Anesthesia Plan Comments:       Anesthesia Quick Evaluation

## 2013-12-08 NOTE — Progress Notes (Signed)
Dr. Shelle Iron in to see patient- made aware of left foot being cool to the touch-left dorsalis pulse palpable- does not move l left foot toes  Upon request nor left foot- after much encouragement patient moved left foot toes and moved left foot

## 2013-12-08 NOTE — Progress Notes (Deleted)
BP remained elevated-Lopressor 2.5 mg given as ordered by NP. Will cont to monitor. BP resting and breathing has improved since IVF decreased to  KVO rate. Srp, RN 

## 2013-12-08 NOTE — Progress Notes (Signed)
Pt resting care giver at bedside, no acute distress. Will cont to monitor.SRP, RN

## 2013-12-08 NOTE — Progress Notes (Signed)
AP Pelvis and Lateral Left Hip  X-rays and Portable Chest X-ray done.

## 2013-12-08 NOTE — Progress Notes (Signed)
PROGRESS NOTE  [name]:[id number] DOB: 08/21/1940 DOA: 12/06/2013 PCP: Gweneth Dimitri, MD  Assessment/Plan: Left subcapital femoral neck fracture  -Secondary to mechanical fall  -appreciate Dr. Shelle Iron -12/08/2013--left hip hemiarthroplasty -Pain control  -PT/OT Dyspnea on exertion/tobacco abuse  -Likely secondary to underlying COPD  -Continue bronchodilators  -tobacco cessation discussed  -Nicoderm patch  -Flutter valve -12/08/2013 chest x-ray shows chronic interstitial markings Hypertension  -Discontinue intravenous metoprolol -Start po metoprolol Hyponatremia  -This appears to be chronic with baseline sodium 124-127  -Check TSH, and Am cortisol  -restart gentle hydration as pt has poor po intake /hr Leukocytosis  -Likely stress demargination  -Patient is afebrile and hemodynamically stable  -Follow blood cultures  Alcohol abuse  -Alcohol withdrawal protocol  Right hilar mass on chest x-ray  -12/06/2013 CT chest shows no lung masses, minimal basilar atelectasis -Right hilar prominence is likely due to vasculature  -CT chest also shows chronic thoracic spine compression deformities with T6 vertebroplasty  Family Communication: Updated sister in law--[name] Hewett Disposition Plan: SNF when cleared by ortho         Procedures/Studies: Dg Hip Complete Left  12/06/2013   CLINICAL DATA:  Left hip pain after falling today.  EXAM: LEFT HIP - COMPLETE 2+ VIEW  COMPARISON:  None.  FINDINGS: The bones are demineralized. There is a subcapital fracture of the left femur which appears is mildly displaced. There is no evidence of dislocation or pelvic fracture. The right femoral neck appears intact.  IMPRESSION: Mildly displaced subcapital fracture of the left femoral neck.   Electronically Signed   By: Roxy Horseman M.D.   On: 12/06/2013 18:07   Ct Chest W Contrast  12/07/2013   CLINICAL DATA:  Right hilar prominence noted on chest radiograph.  EXAM: CT CHEST  WITH CONTRAST  TECHNIQUE: Multidetector CT imaging of the chest was performed during intravenous contrast administration.  CONTRAST:  80mL OMNIPAQUE IOHEXOL 300 MG/ML  SOLN  COMPARISON:  Chest radiograph performed earlier today at 7:31 p.m., and CTA of the chest performed 08/17/2012  FINDINGS: Minimal bibasilar atelectasis is noted. The lungs are otherwise clear. No suspicious masses are seen. There is no evidence of pleural effusion or pneumothorax.  Prominence of the right hilum reflects normal vasculature. There is no evidence of mediastinal lymphadenopathy. Scattered calcification is noted at the aortic and mitral valves. The great vessels are grossly unremarkable in appearance, aside from mild calcific atherosclerotic disease. No pericardial effusion is identified.  The thyroid gland is unremarkable in appearance. No axillary lymphadenopathy is seen  Minimal hypodensities within the periphery of the liver may reflect tiny cysts, but are nonspecific in appearance. The spleen is unremarkable in appearance.  No acute osseous abnormalities are identified. Multiple mild chronic compression deformities are noted throughout the thoracic spine, most prominent at T6 and T10. These are stable from 2014. The patient is status post vertebroplasty at T6.  IMPRESSION: 1. Apparent prominence of the right hilum reflects normal vasculature. No mass is seen. 2. Minimal bibasilar atelectasis noted; lungs otherwise clear. 3. Scattered calcification at the aortic and mitral valves. 4. Question of tiny hepatic cysts. 5. Multiple mild compression deformities along the thoracic spine, relatively stable in appearance from 2014.   Electronically Signed   By: Roanna Raider M.D.   On: 12/07/2013 00:36   Dg Pelvis Portable  12/08/2013   CLINICAL DATA:  Postoperative from left hip joint replacement  EXAM: PORTABLE PELVIS 1-2 VIEWS  COMPARISON:  CT scan of the pelvis dated 07 December 2013 prior to hip joint replacement.  FINDINGS:  There is a prosthetic left hip joint in good position. The native bone of the proximal femur is normal in appearance. There is diffuse osteopenia of the visualized portions of the pelvis. There are skin staples present.  IMPRESSION: There is no immediate postprocedure complication following left hip joint replacement.   Electronically Signed   By: Meenakshi Sazama  [place]   On: 12/08/2013 13:16   Ct Hip Left Wo Contrast  12/07/2013   CLINICAL DATA:  Fall 12/06/2013.  Subcapital femoral neck fracture.  EXAM: CT OF THE LEFT HIP WITHOUT CONTRAST  TECHNIQUE: Multidetector CT imaging of the left hip was performed according to the standard protocol. Multiplanar CT image reconstructions were also generated.  COMPARISON:  12/06/2013.  FINDINGS: Subcapital LEFT femoral neck fracture. Mild impaction. Osteopenia is present with poor mineralization of the femur and visualized pelvic bones. Minimal LEFT hip osteoarthritis. No acetabular fracture is present. Hemarthrosis. Incidental visualization the anatomic pelvis shows colonic diverticulosis and atherosclerosis.  There is no intertrochanteric extension of the fracture. No destructive osseous lesions.  IMPRESSION: Nondisplaced mildly impacted subcapital LEFT femoral neck fracture.   Electronically Signed   By: Andreas Newport M.D.   On: 12/07/2013 09:56   Ct 3d Recon At Scanner  12/07/2013   CLINICAL DATA:  Nonspecific (abnormal) findings on radiological and other examination of musculoskeletal system. Subcapital LEFT femoral neck fracture  EXAM: 3-DIMENSIONAL CT IMAGE RENDERING ON ACQUISITION WORKSTATION  TECHNIQUE: 3-dimensional CT images were rendered by post-processing of the original CT data on an acquisition workstation. The 3-dimensional CT images were interpreted and findings were reported in the accompanying complete CT report for this study  COMPARISON:  CT today.  FINDINGS: Subcapital LEFT femoral neck fracture is again noted.  IMPRESSION: Subcapital LEFT femoral neck  fracture.   Electronically Signed   By: Andreas Newport M.D.   On: 12/07/2013 09:57   Dg Chest Port 1 View  12/08/2013   CLINICAL DATA:  Bloody sputum ; status post left hip joint replacement  EXAM: PORTABLE CHEST - 1 VIEW  COMPARISON:  Portable chest x-ray of December 06, 2013  FINDINGS: The lungs are well-expanded and exhibit no alveolar infiltrate nor parenchymal mass. The interstitial markings are coarse but stable. The heart and pulmonary vascularity are within the limits of normal. There is mild stable tortuosity of the descending thoracic aorta. The patient has undergone previous kyphoplasty at T6.  IMPRESSION: COPD.  There is no acute cardiopulmonary abnormality.   Electronically Signed   By: Afrika Brick  [place]   On: 12/08/2013 13:20   Dg Chest Port 1 View  12/06/2013   CLINICAL DATA:  Fall.  Smoker.  Cough.  EXAM: PORTABLE CHEST - 1 VIEW  COMPARISON:  01/19/2013  FINDINGS: Heart is mildly enlarged. There is prominence of interstitial markings. Perihilar peribronchial thickening is present. There are no focal consolidations. No pleural effusion. No pneumothorax. There is question of a mass in the right hilar region. Further evaluation with chest CT is recommended. Intravenous contrast is recommended unless contraindicated. No acute, displaced fracture.  IMPRESSION: 1. Cardiomegaly without pulmonary edema. 2. Bronchitic changes. 3. Question of right hilar mass or adenopathy. Further evaluation with CT of the chest is recommended.   Electronically Signed   By: Rosalie Gums M.D.   On: 12/06/2013 20:00   Dg Hip Portable 1 View Left  12/08/2013   CLINICAL DATA:  Status post left hip joint replacement.  EXAM: PORTABLE LEFT HIP - 1 VIEW  COMPARISON:  AP pelvis of today's date  FINDINGS: The left hip joint prosthesis is in good position radiographically. The interface with the native bone of the proximal femur is good. There is skin staples present.  IMPRESSION: There is no immediate postprocedure complication  following left hip joint replacement.   Electronically Signed   By: Ezequiel Macauley  [place]   On: 12/08/2013 13:16         Subjective:  Patient appears to be more lucid today. Denies any fevers, chills, chest pain, shortness breath, nausea, vomiting, abdominal pain, diarrhea, dysuria  Objective: Filed Vitals:   12/08/13 1330 12/08/13 1345 12/08/13 1413 12/08/13 1600  BP: 125/65 126/64 113/60   Pulse: 83 87 84   Temp:  98.6 F (37 C) 98.4 F (36.9 C)   TempSrc:      Resp: Height:      Weight:      SpO2: 97% 95% 95% 98%    Intake/Output Summary (Last 24 hours) at 12/08/13 1749 Last data filed at 12/08/13 1715  Gross per 24 hour  Intake   1233 ml  Output   1115 ml  Net    118 ml   Weight change:  Exam:   General:  Pt is alert, follows commands appropriately, not in acute distress  HEENT: No icterus, No thrush,  [place]/AT  Cardiovascular: RRR, S1/S2, no rubs, no gallops  Respiratory: bibasilar rales. No wheezing. Good air movement.   Abdomen: Soft/+BS, non tender, non distended, no guarding  Extremities: No edema, No lymphangitis, No petechiae, No rashes, no synovitis  Data Reviewed: Basic Metabolic Panel:  Recent Labs Lab 12/06/13 1842 12/08/13 0453 12/08/13 1511  NA 132* 133*  --   K 3.9 3.7  --   CL 91* 95*  --   CO2 26 20  --   GLUCOSE 86 66*  --   BUN 17 10  --   CREATININE 0.61 0.45* 0.48*  CALCIUM 9.6 9.2  --    Liver Function Tests:  Recent Labs Lab 12/08/13 0453  AST 17  ALT 20  ALKPHOS 88  BILITOT 0.7  PROT 6.5  ALBUMIN 3.0*   No results found for this basename: LIPASE, AMYLASE,  in the last 168 hours No results found for this basename: AMMONIA,  in the last 168 hours CBC:  Recent Labs Lab 12/06/13 1842 12/08/13 0453 12/08/13 1511  WBC 15.9* 14.7* 24.2*  NEUTROABS 12.7*  --   --   HGB 14.8 15.1* 15.1*  HCT 44.3 45.2 44.5  MCV 89.3 89.5 88.6  PLT 443* 393 393   Cardiac Enzymes: No results found for this basename:  CKTOTAL, CKMB, CKMBINDEX, TROPONINI,  in the last 168 hours BNP: No components found with this basename: POCBNP,  CBG: No results found for this basename: GLUCAP,  in the last 168 hours  Recent Results (from the past 240 hour(s))  URINE CULTURE     Status: None   Collection Time    12/07/13  1:47 AM      Result Value Ref Range Status   Specimen Description URINE, CATHETERIZED   Final   Special Requests NONE   Final   Culture  Setup Time     Final   Value: 12/07/2013 10:47     Performed at [facility] Count     Final   Value: NO GROWTH     Performed at [facility]  Culture     Final   Value: NO GROWTH     Performed at [facility]   Report Status 12/08/2013 FINAL   Final  CULTURE, BLOOD (ROUTINE X 2)     Status: None   Collection Time    12/07/13  5:30 AM      Result Value Ref Range Status   Specimen Description BLOOD LEFT ARM   Final   Special Requests BOTTLES DRAWN AEROBIC AND ANAEROBIC [facility]   Final   Culture  Setup Time     Final   Value: 12/07/2013 08:28     Performed at [facility]   Culture     Final   Value:        BLOOD CULTURE RECEIVED NO GROWTH TO DATE CULTURE WILL BE HELD FOR 5 DAYS BEFORE ISSUING A FINAL NEGATIVE REPORT     Performed at [facility]   Report Status PENDING   Incomplete  CULTURE, BLOOD (ROUTINE X 2)     Status: None   Collection Time    12/07/13  5:35 AM      Result Value Ref Range Status   Specimen Description BLOOD RIGHT HAND   Final   Special Requests BOTTLES DRAWN AEROBIC ONLY 2CC   Final   Culture  Setup Time     Final   Value: 12/07/2013 08:29     Performed at [facility]   Culture     Final   Value:        BLOOD CULTURE RECEIVED NO GROWTH TO DATE CULTURE WILL BE HELD FOR 5 DAYS BEFORE ISSUING A FINAL NEGATIVE REPORT     Note: Culture results may be compromised due to an inadequate volume of blood received in culture bottles.     Performed at [facility]   Report  Status PENDING   Incomplete  SURGICAL PCR SCREEN     Status: None   Collection Time    12/07/13  2:10 PM      Result Value Ref Range Status   MRSA, PCR NEGATIVE  NEGATIVE Final   Staphylococcus aureus NEGATIVE  NEGATIVE Final   Comment:            The Xpert SA Assay (FDA     approved for NASAL specimens     in patients over 1 years of age),     is one component of     a comprehensive surveillance     program.  Test performance has     been validated by [employer] for patients greater     than or equal to 67 year old.     It is not intended     to diagnose infection nor to     guide or monitor treatment.     Scheduled Meds: .  ceFAZolin (ANCEF) IV  2 g Intravenous Q6H  . dextromethorphan-guaiFENesin  1 tablet Oral BID  . docusate sodium  100 mg Oral BID  . [START ON 12/09/2013] enoxaparin (LOVENOX) injection  30 mg Subcutaneous Q24H  . folic acid  1 mg Oral Daily  . metoprolol  5 mg Intravenous 4 times per day  . multivitamin with minerals  1 tablet Oral Daily  . nicotine  21 mg Transdermal Daily  . thiamine  100 mg Oral Daily   Or  . thiamine  100 mg Intravenous Daily   Continuous Infusions: . sodium chloride Stopped (12/08/13 0956)  . sodium chloride  0.9 % 1,000 mL with potassium chloride 20 mEq infusion 50 mL/hr at 12/08/13 1631     Yosgart Pavey, DO  [facility] Pager [phone]  If 7PM-7AM, please contact night-coverage [email or web address] Password [email or web address] 12/08/2013, 5:49 PM   LOS: 2 days

## 2013-12-08 NOTE — Progress Notes (Signed)
Notified NP-extender on call concerning increase congestion and restlessness, pt soft wheezes in the bases. Decrease IVF per NP orders. BP elevated--med given as ordered and NP notified. Will cont to monitor. SP, RN

## 2013-12-08 NOTE — Interval H&P Note (Signed)
History and Physical Interval Note:  12/08/2013 7:30 AM  [name]  has presented today for surgery, with the diagnosis of left hip fracture  The various methods of treatment have been discussed with the patient and family. After consideration of risks, benefits and other options for treatment, the patient has consented to  Procedure(s): LEFT HEMI HIP ARTHROPLASTY  (Left) as a surgical intervention .  The patient's history has been reviewed, patient examined, no change in status, stable for surgery.  I have reviewed the patient's chart and labs.  Questions were answered to the patient's satisfaction.     Prashant Glosser C

## 2013-12-08 NOTE — Progress Notes (Signed)
Dr. Renold Don called about patient coughing up light red sputum- Orders given.

## 2013-12-09 ENCOUNTER — Encounter (HOSPITAL_COMMUNITY): Payer: Self-pay | Admitting: Specialist

## 2013-12-09 LAB — TSH: TSH: 0.372 u[IU]/mL (ref 0.350–4.500)

## 2013-12-09 LAB — CBC
HEMATOCRIT: 39.4 % (ref 36.0–46.0)
Hemoglobin: 13.2 g/dL (ref 12.0–15.0)
MCH: 29.6 pg (ref 26.0–34.0)
MCHC: 33.5 g/dL (ref 30.0–36.0)
MCV: 88.3 fL (ref 78.0–100.0)
Platelets: 405 10*3/uL — ABNORMAL HIGH (ref 150–400)
RBC: 4.46 MIL/uL (ref 3.87–5.11)
RDW: 13.9 % (ref 11.5–15.5)
WBC: 11 10*3/uL — AB (ref 4.0–10.5)

## 2013-12-09 LAB — BASIC METABOLIC PANEL
ANION GAP: 11 (ref 5–15)
BUN: 16 mg/dL (ref 6–23)
CO2: 25 meq/L (ref 19–32)
CREATININE: 0.56 mg/dL (ref 0.50–1.10)
Calcium: 8.7 mg/dL (ref 8.4–10.5)
Chloride: 99 mEq/L (ref 96–112)
GFR calc Af Amer: >90 mL/min (ref >90)
GFR calc non Af Amer: 89 mL/min — ABNORMAL LOW (ref >90)
Glucose, Bld: 102 mg/dL — ABNORMAL HIGH (ref 70–99)
Potassium: 3.9 mEq/L (ref 3.7–5.3)
Sodium: 135 mEq/L — ABNORMAL LOW (ref 137–147)

## 2013-12-09 LAB — MAGNESIUM: MAGNESIUM: 1.9 mg/dL (ref 1.5–2.5)

## 2013-12-09 MED ORDER — IPRATROPIUM-ALBUTEROL 0.5-2.5 (3) MG/3ML IN SOLN
3.0000 mL | Freq: Four times a day (QID) | RESPIRATORY_TRACT | Status: DC
  Administered 2013-12-09 – 2013-12-10 (×5): 3 mL via RESPIRATORY_TRACT
  Filled 2013-12-09 (×5): qty 3

## 2013-12-09 MED ORDER — ENOXAPARIN SODIUM 40 MG/0.4ML ~~LOC~~ SOLN
40.0000 mg | SUBCUTANEOUS | Status: DC
  Administered 2013-12-10 – 2013-12-12 (×3): 40 mg via SUBCUTANEOUS
  Filled 2013-12-09 (×4): qty 0.4

## 2013-12-09 MED ORDER — ENSURE COMPLETE PO LIQD
237.0000 mL | ORAL | Status: DC
  Administered 2013-12-09 – 2013-12-11 (×3): 237 mL via ORAL

## 2013-12-09 NOTE — Evaluation (Signed)
Physical Therapy Evaluation Patient Details Name: ASTHA PROBASCO MRN: [id number] DOB: 09-Jan-1941 Today's Date: 12/09/2013   History of Present Illness  L hip fx - s/p hemiarthroplasty  Clinical Impression  Pt s/p L hip fx with hemi-arthroplasty presents with decreased L LE strength/ROM, post op pain, posterior THP, PWB status and premorbid deconditioning limiting functional mobility.  Pt would greatly benefit from follow up rehab at SNF level to maximize IND and safety prior to return home    Follow Up Recommendations SNF    Equipment Recommendations       Recommendations for Other Services OT consult     Precautions / Restrictions Precautions Precautions: Fall;Posterior Hip Precaution Booklet Issued: Yes (comment) Precaution Comments: Sign hung on wall - all precautions reviewed Required Braces or Orthoses: Knee Immobilizer - Left Knee Immobilizer - Left: Other (comment) (on in bed 2* post THP) Restrictions Weight Bearing Restrictions: Yes LUE Weight Bearing: Partial weight bearing LUE Partial Weight Bearing Percentage or Pounds: 50%      Mobility  Bed Mobility Overal bed mobility: +2 for physical assistance;Needs Assistance Bed Mobility: Supine to Sit;Sit to Supine     Supine to sit: Mod assist;+2 for physical assistance Sit to supine: Max assist;+2 for physical assistance   General bed mobility comments: cues for sequence and use of R LE to self assist - assisted to/from EOB with pad  Transfers                 General transfer comment: Pt declines attempt to stand - sat at EOB with min assist x 8 min  Ambulation/Gait                Stairs            Wheelchair Mobility    Modified Rankin (Stroke Patients Only)       Balance                                             Pertinent Vitals/Pain Pain Assessment: 0-10 Pain Score: 5  Pain Location: L hip Pain Intervention(s): Limited activity within patient's  tolerance;Monitored during session;Premedicated before session    Home Living Family/patient expects to be discharged to:: Skilled nursing facility                 Additional Comments: Pt has 24 hr paid caregivers - limited ability to physically assist pt    Prior Function Level of Independence: Needs assistance         Comments: 24 hr paid caregivers     Hand Dominance   Dominant Hand: Right    Extremity/Trunk Assessment   Upper Extremity Assessment: Overall WFL for tasks assessed           Lower Extremity Assessment: LLE deficits/detail   LLE Deficits / Details: 2-/5 hip strength with AAROM at L hip to 70 flex and 20 abd     Communication   Communication: HOH  Cognition Arousal/Alertness: Awake/alert Behavior During Therapy: WFL for tasks assessed/performed Overall Cognitive Status: Within Functional Limits for tasks assessed                      General Comments      Exercises Total Joint Exercises Ankle Circles/Pumps: AROM;AAROM;10 reps;Supine;Both Heel Slides: AAROM;Left;10 reps;Supine Hip ABduction/ADduction: AAROM;Left;10 reps;Supine      Assessment/Plan    PT Assessment  Patient needs continued PT services  PT Diagnosis Difficulty walking   PT Problem List Decreased strength;Decreased range of motion;Decreased activity tolerance;Decreased mobility;Decreased knowledge of use of DME;Decreased safety awareness;Decreased knowledge of precautions  PT Treatment Interventions DME instruction;Gait training;Functional mobility training;Therapeutic activities;Therapeutic exercise;Patient/family education   PT Goals (Current goals can be found in the Care Plan section) Acute Rehab PT Goals Patient Stated Goal: Home asap PT Goal Formulation: With patient Time For Goal Achievement: 12/23/13 Potential to Achieve Goals: Good    Frequency Min 4X/week   Barriers to discharge        Co-evaluation               End of Session  Equipment Utilized During Treatment: Gait belt Activity Tolerance: Patient limited by fatigue;Patient limited by pain Patient left: in bed;with call bell/phone within reach;with family/visitor present Nurse Communication: Mobility status         Time: 1610-9604 PT Time Calculation (min): 32 min   Charges:   PT Evaluation $Initial PT Evaluation Tier I: 1 Procedure PT Treatments $Therapeutic Exercise: 8-22 mins $Therapeutic Activity: 8-22 mins   PT G Codes:          Sheetal Lyall 12/09/2013, 1:37 PM

## 2013-12-09 NOTE — Progress Notes (Signed)
PROGRESS NOTE  [name] ALP:[id number] DOB: 06-13-1940 DOA: 12/06/2013 PCP: Gweneth Dimitri, MD  Assessment/Plan: Left subcapital femoral neck fracture  -Secondary to mechanical fall  -appreciate Dr. Shelle Iron  -DVT prophylaxis per ortho -12/08/2013--left hip hemiarthroplasty  -Pain control  -PT/OT-->SNF  Dyspnea on exertion/tobacco abuse  -Likely secondary to underlying COPD  -Continue bronchodilators  -tobacco cessation discussed  -Nicoderm patch  -Flutter valve  -12/08/2013 chest x-ray shows chronic interstitial markings  Hypertension  -Discontinue intravenous metoprolol  -Continue po metoprolol  Hyponatremia  -This appears to be chronic with baseline sodium 124-127  -Check TSH--0.372 -Am cortisol--pending -restart gentle hydration as pt has poor po intake /hr--sodium improved Leukocytosis  -Likely stress demargination-->improving -Patient is afebrile and hemodynamically stable  -Follow blood cultures--neg  Alcohol abuse  -Alcohol withdrawal protocol  --CIWA= 0 Right hilar mass on chest x-ray  -12/06/2013 CT chest shows no lung masses, minimal basilar atelectasis  -Right hilar prominence is likely due to vasculature  -CT chest also shows chronic thoracic spine compression deformities with T6 vertebroplasty  Family Communication: Updated sister in law--[name] Hewett  Disposition Plan: SNF 12/10/13 or 12/11/13          Procedures/Studies: Dg Hip Complete Left  12/06/2013   CLINICAL DATA:  Left hip pain after falling today.  EXAM: LEFT HIP - COMPLETE 2+ VIEW  COMPARISON:  None.  FINDINGS: The bones are demineralized. There is a subcapital fracture of the left femur which appears is mildly displaced. There is no evidence of dislocation or pelvic fracture. The right femoral neck appears intact.  IMPRESSION: Mildly displaced subcapital fracture of the left femoral neck.   Electronically Signed   By: Roxy Horseman M.D.   On: 12/06/2013 18:07   Ct Chest W  Contrast  12/07/2013   CLINICAL DATA:  Right hilar prominence noted on chest radiograph.  EXAM: CT CHEST WITH CONTRAST  TECHNIQUE: Multidetector CT imaging of the chest was performed during intravenous contrast administration.  CONTRAST:  80mL OMNIPAQUE IOHEXOL 300 MG/ML  SOLN  COMPARISON:  Chest radiograph performed earlier today at 7:31 p.m., and CTA of the chest performed 08/17/2012  FINDINGS: Minimal bibasilar atelectasis is noted. The lungs are otherwise clear. No suspicious masses are seen. There is no evidence of pleural effusion or pneumothorax.  Prominence of the right hilum reflects normal vasculature. There is no evidence of mediastinal lymphadenopathy. Scattered calcification is noted at the aortic and mitral valves. The great vessels are grossly unremarkable in appearance, aside from mild calcific atherosclerotic disease. No pericardial effusion is identified.  The thyroid gland is unremarkable in appearance. No axillary lymphadenopathy is seen  Minimal hypodensities within the periphery of the liver may reflect tiny cysts, but are nonspecific in appearance. The spleen is unremarkable in appearance.  No acute osseous abnormalities are identified. Multiple mild chronic compression deformities are noted throughout the thoracic spine, most prominent at T6 and T10. These are stable from 2014. The patient is status post vertebroplasty at T6.  IMPRESSION: 1. Apparent prominence of the right hilum reflects normal vasculature. No mass is seen. 2. Minimal bibasilar atelectasis noted; lungs otherwise clear. 3. Scattered calcification at the aortic and mitral valves. 4. Question of tiny hepatic cysts. 5. Multiple mild compression deformities along the thoracic spine, relatively stable in appearance from 2014.   Electronically Signed   By: Roanna Raider M.D.   On: 12/07/2013 00:36   Dg Pelvis Portable  12/08/2013   CLINICAL DATA:  Postoperative from  left hip joint replacement  EXAM: PORTABLE PELVIS 1-2 VIEWS   COMPARISON:  CT scan of the pelvis dated 07 December 2013 prior to hip joint replacement.  FINDINGS: There is a prosthetic left hip joint in good position. The native bone of the proximal femur is normal in appearance. There is diffuse osteopenia of the visualized portions of the pelvis. There are skin staples present.  IMPRESSION: There is no immediate postprocedure complication following left hip joint replacement.   Electronically Signed   By: Kindel Rochefort  [place]   On: 12/08/2013 13:16   Ct Hip Left Wo Contrast  12/07/2013   CLINICAL DATA:  Fall 12/06/2013.  Subcapital femoral neck fracture.  EXAM: CT OF THE LEFT HIP WITHOUT CONTRAST  TECHNIQUE: Multidetector CT imaging of the left hip was performed according to the standard protocol. Multiplanar CT image reconstructions were also generated.  COMPARISON:  12/06/2013.  FINDINGS: Subcapital LEFT femoral neck fracture. Mild impaction. Osteopenia is present with poor mineralization of the femur and visualized pelvic bones. Minimal LEFT hip osteoarthritis. No acetabular fracture is present. Hemarthrosis. Incidental visualization the anatomic pelvis shows colonic diverticulosis and atherosclerosis.  There is no intertrochanteric extension of the fracture. No destructive osseous lesions.  IMPRESSION: Nondisplaced mildly impacted subcapital LEFT femoral neck fracture.   Electronically Signed   By: Andreas Newport M.D.   On: 12/07/2013 09:56   Ct 3d Recon At Scanner  12/07/2013   CLINICAL DATA:  Nonspecific (abnormal) findings on radiological and other examination of musculoskeletal system. Subcapital LEFT femoral neck fracture  EXAM: 3-DIMENSIONAL CT IMAGE RENDERING ON ACQUISITION WORKSTATION  TECHNIQUE: 3-dimensional CT images were rendered by post-processing of the original CT data on an acquisition workstation. The 3-dimensional CT images were interpreted and findings were reported in the accompanying complete CT report for this study  COMPARISON:  CT today.   FINDINGS: Subcapital LEFT femoral neck fracture is again noted.  IMPRESSION: Subcapital LEFT femoral neck fracture.   Electronically Signed   By: Andreas Newport M.D.   On: 12/07/2013 09:57   Dg Chest Port 1 View  12/08/2013   CLINICAL DATA:  Bloody sputum ; status post left hip joint replacement  EXAM: PORTABLE CHEST - 1 VIEW  COMPARISON:  Portable chest x-ray of December 06, 2013  FINDINGS: The lungs are well-expanded and exhibit no alveolar infiltrate nor parenchymal mass. The interstitial markings are coarse but stable. The heart and pulmonary vascularity are within the limits of normal. There is mild stable tortuosity of the descending thoracic aorta. The patient has undergone previous kyphoplasty at T6.  IMPRESSION: COPD.  There is no acute cardiopulmonary abnormality.   Electronically Signed   By: Spyridon Hornstein  [place]   On: 12/08/2013 13:20   Dg Chest Port 1 View  12/06/2013   CLINICAL DATA:  Fall.  Smoker.  Cough.  EXAM: PORTABLE CHEST - 1 VIEW  COMPARISON:  01/19/2013  FINDINGS: Heart is mildly enlarged. There is prominence of interstitial markings. Perihilar peribronchial thickening is present. There are no focal consolidations. No pleural effusion. No pneumothorax. There is question of a mass in the right hilar region. Further evaluation with chest CT is recommended. Intravenous contrast is recommended unless contraindicated. No acute, displaced fracture.  IMPRESSION: 1. Cardiomegaly without pulmonary edema. 2. Bronchitic changes. 3. Question of right hilar mass or adenopathy. Further evaluation with CT of the chest is recommended.   Electronically Signed   By: Rosalie Gums M.D.   On: 12/06/2013 20:00   Dg Hip Portable 1 View Left  12/08/2013   CLINICAL DATA:  Status post left hip joint replacement.  EXAM: PORTABLE LEFT HIP - 1 VIEW  COMPARISON:  AP pelvis of today's date  FINDINGS: The left hip joint prosthesis is in good position radiographically. The interface with the native bone of the proximal  femur is good. There is skin staples present.  IMPRESSION: There is no immediate postprocedure complication following left hip joint replacement.   Electronically Signed   By: Viktoriya Glaspy  [place]   On: 12/08/2013 13:16         Subjective: Patient has some dyspnea and pain in her left hip with activity. Otherwise denies fevers, chills, chest pain, shortness breath, nausea, vomiting, diarrhea, abdominal pain. No dysuria.  Objective: Filed Vitals:   12/09/13 0733 12/09/13 1133 12/09/13 1450 12/09/13 1507  BP:   147/66   Pulse:   86   Temp:   98.5 F (36.9 C)   TempSrc:   Oral   Resp: 18 18 18 18   Height:      Weight:      SpO2: 99% 99% 98% 99%    Intake/Output Summary (Last 24 hours) at 12/09/13 1853 Last data filed at 12/09/13 0600  Gross per 24 hour  Intake  977.5 ml  Output    200 ml  Net  777.5 ml   Weight change:  Exam:   General:  Pt is alert, follows commands appropriately, not in acute distress  HEENT: No icterus, No thrush,[place]/AT  Cardiovascular: RRR, S1/S2, no rubs, no gallops  Respiratory: Bibasilar rhonchi. No wheezing. Good air movement.  Abdomen: Soft/+BS, non tender, non distended, no guarding  Extremities: No edema, No lymphangitis, No petechiae, No rashes, no synovitis  Data Reviewed: Basic Metabolic Panel:  Recent Labs Lab 12/06/13 1842 12/08/13 0453 12/08/13 1511 12/09/13 0430  NA 132* 133*  --  135*  K 3.9 3.7  --  3.9  CL 91* 95*  --  99  CO2 26 20  --  25  GLUCOSE 86 66*  --  102*  BUN 17 10  --  16  CREATININE 0.61 0.45* 0.48* 0.56  CALCIUM 9.6 9.2  --  8.7  MG  --   --   --  1.9   Liver Function Tests:  Recent Labs Lab 12/08/13 0453  AST 17  ALT 20  ALKPHOS 88  BILITOT 0.7  PROT 6.5  ALBUMIN 3.0*   No results found for this basename: LIPASE, AMYLASE,  in the last 168 hours No results found for this basename: AMMONIA,  in the last 168 hours CBC:  Recent Labs Lab 12/06/13 1842 12/08/13 0453 12/08/13 1511 12/09/13 0430   WBC 15.9* 14.7* 24.2* 11.0*  NEUTROABS 12.7*  --   --   --   HGB 14.8 15.1* 15.1* 13.2  HCT 44.3 45.2 44.5 39.4  MCV 89.3 89.5 88.6 88.3  PLT 443* 393 393 405*   Cardiac Enzymes: No results found for this basename: CKTOTAL, CKMB, CKMBINDEX, TROPONINI,  in the last 168 hours BNP: No components found with this basename: POCBNP,  CBG: No results found for this basename: GLUCAP,  in the last 168 hours  Recent Results (from the past 240 hour(s))  URINE CULTURE     Status: None   Collection Time    12/07/13  1:47 AM      Result Value Ref Range Status   Specimen Description URINE, CATHETERIZED   Final   Special Requests NONE   Final   Culture  Setup Time  Final   Value: 12/07/2013 10:47     Performed at [facility] Count     Final   Value: NO GROWTH     Performed at [facility]   Culture     Final   Value: NO GROWTH     Performed at [facility]   Report Status 12/08/2013 FINAL   Final  CULTURE, BLOOD (ROUTINE X 2)     Status: None   Collection Time    12/07/13  5:30 AM      Result Value Ref Range Status   Specimen Description BLOOD LEFT ARM   Final   Special Requests BOTTLES DRAWN AEROBIC AND ANAEROBIC [facility]   Final   Culture  Setup Time     Final   Value: 12/07/2013 08:28     Performed at [facility]   Culture     Final   Value:        BLOOD CULTURE RECEIVED NO GROWTH TO DATE CULTURE WILL BE HELD FOR 5 DAYS BEFORE ISSUING A FINAL NEGATIVE REPORT     Performed at [facility]   Report Status PENDING   Incomplete  CULTURE, BLOOD (ROUTINE X 2)     Status: None   Collection Time    12/07/13  5:35 AM      Result Value Ref Range Status   Specimen Description BLOOD RIGHT HAND   Final   Special Requests BOTTLES DRAWN AEROBIC ONLY 2CC   Final   Culture  Setup Time     Final   Value: 12/07/2013 08:29     Performed at [facility]   Culture     Final   Value:        BLOOD CULTURE RECEIVED NO GROWTH TO DATE CULTURE  WILL BE HELD FOR 5 DAYS BEFORE ISSUING A FINAL NEGATIVE REPORT     Note: Culture results may be compromised due to an inadequate volume of blood received in culture bottles.     Performed at [facility]   Report Status PENDING   Incomplete  SURGICAL PCR SCREEN     Status: None   Collection Time    12/07/13  2:10 PM      Result Value Ref Range Status   MRSA, PCR NEGATIVE  NEGATIVE Final   Staphylococcus aureus NEGATIVE  NEGATIVE Final   Comment:            The Xpert SA Assay (FDA     approved for NASAL specimens     in patients over 47 years of age),     is one component of     a comprehensive surveillance     program.  Test performance has     been validated by [employer] for patients greater     than or equal to 52 year old.     It is not intended     to diagnose infection nor to     guide or monitor treatment.     Scheduled Meds: . dextromethorphan-guaiFENesin  1 tablet Oral BID  . docusate sodium  100 mg Oral BID  . enoxaparin (LOVENOX) injection  30 mg Subcutaneous Q24H  . feeding supplement (ENSURE COMPLETE)  237 mL Oral Q24H  . folic acid  1 mg Oral Daily  . ipratropium-albuterol  3 mL Nebulization Q6H  . metoprolol tartrate  25 mg Oral BID  . multivitamin with minerals  1 tablet  Oral Daily  . nicotine  21 mg Transdermal Daily  . thiamine  100 mg Oral Daily   Or  . thiamine  100 mg Intravenous Daily   Continuous Infusions: . sodium chloride Stopped (12/08/13 0956)  . sodium chloride 0.9 % 1,000 mL with potassium chloride 20 mEq infusion 50 mL/hr at 12/08/13 1631     Jalesa Thien, DO  [facility] Pager [phone]  If 7PM-7AM, please contact night-coverage [email or web address] Password [employer] 12/09/2013, 6:53 PM   LOS: 3 days

## 2013-12-09 NOTE — Progress Notes (Signed)
OT Cancellation Note  Patient Details Name: [name] MRN: [id number] DOB: [birth year]   Cancelled Treatment:    Reason Eval/Treat Not Completed: Other (comment)Noted plan to go to SNF. Will defer OT eval to SNF and  [name], [name] 12/09/2013, 11:25 AM

## 2013-12-09 NOTE — Progress Notes (Signed)
NUTRITION FOLLOW UP  Intervention:   -Recommend Ensure Complete once daily -Continue with MVI -RD to continue to monitor  Nutrition Dx:   Increased nutrient needs (protein/kcal) related to increased demand for nutrients as evidenced by BMI < 18.5, hip fx; ongoing   Goal:   Pt to meet >/= 90% of their estimated nutrition needs- ongoing  Monitor:   Total protein/energy intake, labs, weights, supplement tolerance  Assessment:   9/16: -Pt confused, restless, eager for diet advancement. Food hx obtained from home health nurse  -Home Health nurse reported pt with decreased intake for past 2-3 days d/t recent dental surgery. Had bottom teeth extracted and has complained of mouth soreness  -Diet recall indicates pt consumes cereal and coffee for breakfast, lunch typically sandwich or half of 6" sub, and dinner is a light meal of seafood salad or shrimp. Cannot tolerate hot foods. Snacks on candies and sweets. Consumes three glasses of wine daily, which MD noted likely attributes to pt frequent falls  -Nurse denied any changes in weight; reported pt has always been small framed; however hx of ETOH abuse, and muscle and fat wasting indicate pt likely some degree of malnourished  -NPO for possible ORIF vs hemiarthroplasty. Discussed with RN importance of balanced snacks and nutrition for post op. Will order Ensure Complete once daily. Would also benefit from softer diet d/t recent dental extractions  9/18: -Pt s/p left hip hemiarthroplasty -Diet advanced to Soft on 9/17 -PO intake 25%. Ensure Complete once daily ordered -Plan to d/c to SNF, recommend pt continue with supplement regimen upon d/c  Height: Ht Readings from Last 1 Encounters:  12/07/13  (1.702 m)    Weight Status:   Wt Readings from Last 1 Encounters:  12/07/13 107 lb 12.9 oz (48.9 kg)    Re-estimated needs:  Kcal: 1500-1700  Protein: 60-70 gram  Fluid: >/=1500 ml/daily    Skin: surgical incision on left  hip  Diet Order: Parke Simmers   Intake/Output Summary (Last 24 hours) at 12/09/13 1203 Last data filed at 12/09/13 0600  Gross per 24 hour  Intake 1693.5 ml  Output    290 ml  Net 1403.5 ml    Last BM: 9/15   Labs:   Recent Labs Lab 12/06/13 1842 12/08/13 0453 12/08/13 1511 12/09/13 0430  NA 132* 133*  --  135*  K 3.9 3.7  --  3.9  CL 91* 95*  --  99  CO2 26 20  --  25  BUN 17 10  --  16  CREATININE 0.61 0.45* 0.48* 0.56  CALCIUM 9.6 9.2  --  8.7  MG  --   --   --  1.9  GLUCOSE 86 66*  --  102*    CBG (last 3)  No results found for this basename: GLUCAP,  in the last 72 hours  Scheduled Meds: . dextromethorphan-guaiFENesin  1 tablet Oral BID  . docusate sodium  100 mg Oral BID  . enoxaparin (LOVENOX) injection  30 mg Subcutaneous Q24H  . feeding supplement (ENSURE COMPLETE)  237 mL Oral Q24H  . folic acid  1 mg Oral Daily  . metoprolol tartrate  25 mg Oral BID  . multivitamin with minerals  1 tablet Oral Daily  . nicotine  21 mg Transdermal Daily  . thiamine  100 mg Oral Daily   Or  . thiamine  100 mg Intravenous Daily    Continuous Infusions: . sodium chloride Stopped (12/08/13 0956)  . sodium chloride 0.9 %  1,000 mL with potassium chloride 20 mEq infusion 50 mL/hr at 12/08/13 1631    Lloyd Huger MS RD LDN Clinical Dietitian [phone]

## 2013-12-09 NOTE — Progress Notes (Addendum)
Subjective: 1 Day Post-Op Procedure(s) (LRB): LEFT HIP HEMI ARTHROPLASTY  (Left) Patient reports pain as mild.    Objective: Vital signs in last 24 hours: Temp:  [97.7 F (36.5 C)-98.6 F (37 C)] 97.7 F (36.5 C) (09/18 0509) Pulse Rate:  [83-102] 91 (09/18 0509) Resp:  [13-25] 18 (09/18 0509) BP: (101-186)/(58-102) 120/69 mmHg (09/18 0509) SpO2:  [91 %-100 %] 99 % (09/18 0509)  Intake/Output from previous day: 09/17 0701 - 09/18 0700 In: 1693.5 [P.O.:240; I.V.:1303.5; IV Piggyback:150] Out: 490 [Urine:490] Intake/Output this shift:     Recent Labs  12/06/13 1842 12/08/13 0453 12/08/13 1511 12/09/13 0430  HGB 14.8 15.1* 15.1* 13.2    Recent Labs  12/08/13 1511 12/09/13 0430  WBC 24.2* 11.0*  RBC 5.02 4.46  HCT 44.5 39.4  PLT 393 405*    Recent Labs  12/08/13 0453 12/08/13 1511 12/09/13 0430  NA 133*  --  135*  K 3.7  --  3.9  CL 95*  --  99  CO2 20  --  25  BUN 10  --  16  CREATININE 0.45* 0.48* 0.56  GLUCOSE 66*  --  102*  CALCIUM 9.2  --  8.7    Recent Labs  12/06/13 1842  INR 1.26    Neurologically intact ABD soft Neurovascular intact Sensation intact distally Intact pulses distally Dorsiflexion/Plantar flexion intact Incision: dressing C/D/I and no drainage No cellulitis present Compartment soft no calf pain or sign of DVT  Assessment/Plan: 1 Day Post-Op Procedure(s) (LRB): LEFT HIP HEMI ARTHROPLASTY  (Left) Advance diet Up with therapy PWB LLE 50% Knee immobilizer when in bed, posterior hip precautions Will likely require SNF short-term due to multiple falls  Plan D/C to SNF vs. Home with HHPT and aids when medically stable- Monday? Follow up with Dr. Shelle Iron 10-14 days post-op  [name] M. 12/09/2013, 7:29 AM

## 2013-12-09 NOTE — Progress Notes (Signed)
Per RT in report, pt needed to be placed on cpap tonight.  Spoke with RN regarding this.  Per RN, she did not have any knowledge of this.  Pt stated she does not wear cpap at home and no hx of OSA found in chart.  Pt remains on 2lnc at this time with sats >92%.  RN will get an rx if cpap needed tonight and advise RT.

## 2013-12-09 NOTE — Progress Notes (Signed)
Clinical Social Work Department BRIEF PSYCHOSOCIAL ASSESSMENT 12/09/2013  Patient:  [name], [name]     Account Number:  [id number]     Admit date:  12/06/2013  Clinical Social Worker:  Orpah Greek  Date/Time:  12/09/2013 10:21 AM  Referred by:  Physician  Date Referred:  12/09/2013 Referred for  SNF Placement   Other Referral:   Interview type:  Family Other interview type:   patient's sister-in-law, [name] via phone (ph#: [phone])    PSYCHOSOCIAL DATA Living Status:  ALONE Admitted from facility:   Level of care:   Primary support name:  [name] (sister-in-law) h#: [phone] c#: [phone] Primary support relationship to patient:  SIBLING Degree of support available:   good    CURRENT CONCERNS Current Concerns  Post-Acute Placement   Other Concerns:    SOCIAL WORK ASSESSMENT / PLAN CSW received consult for SNF placement.   Assessment/plan status:  Information/Referral to [facility] Other assessment/ plan:   Information/referral to community resources:   CSW completed FL2 and faxed information out to [facility] - provided bed offers.    PATIENT'S/FAMILY'S RESPONSE TO PLAN OF CARE: Patient's sister-in-law informed CSW that patient has been to [facility] SNFs in the past and would prefer [facility]. CSW confirmed with [facility] that they would be able to take patient over the weekend if ready.       Lincoln Maxin, LCSW [facility] Clinical Social Worker cell #: [phone]

## 2013-12-09 NOTE — Progress Notes (Signed)
Clinical Social Work Department CLINICAL SOCIAL WORK PLACEMENT NOTE 12/09/2013  Patient:  [name], [name]  Account Number:  [id number] Admit date:  12/06/2013  Clinical Social Worker:  Orpah Greek  Date/time:  12/09/2013 10:31 AM  Clinical Social Work is seeking post-discharge placement for this patient at the following level of care:   SKILLED NURSING   (*CSW will update this form in Epic as items are completed)   12/09/2013  Patient/family provided with [facility] Department of Clinical Social Work's list of facilities offering this level of care within the geographic area requested by the patient (or if unable, by the patient's family).  12/09/2013  Patient/family informed of their freedom to choose among providers that offer the needed level of care, that participate in Medicare, Medicaid or managed care program needed by the patient, have an available bed and are willing to accept the patient.  12/09/2013  Patient/family informed of [facility]' ownership interest in [facility], as well as of the fact that they are under no obligation to receive care at this facility.  PASARR submitted to EDS on 12/09/2013 PASARR number received on 12/09/2013  FL2 transmitted to all facilities in geographic area requested by pt/family on  12/09/2013 FL2 transmitted to all facilities within larger geographic area on   Patient informed that his/her managed care company has contracts with or will negotiate with  certain facilities, including the following:     Patient/family informed of bed offers received:  12/09/2013 Patient chooses bed at [facility] Physician recommends and patient chooses bed at    Patient to be transferred to [facility] on   Patient to be transferred to facility by  Patient and family notified of transfer on  Name of family member notified:    The following physician request were entered in Epic:   Additional Comments:   Lincoln Maxin,  LCSW [facility] Clinical Social Worker cell #: [phone]

## 2013-12-10 ENCOUNTER — Inpatient Hospital Stay (HOSPITAL_COMMUNITY): Payer: Medicare Other

## 2013-12-10 DIAGNOSIS — J9600 Acute respiratory failure, unspecified whether with hypoxia or hypercapnia: Secondary | ICD-10-CM

## 2013-12-10 DIAGNOSIS — J9601 Acute respiratory failure with hypoxia: Secondary | ICD-10-CM

## 2013-12-10 LAB — CBC
HEMATOCRIT: 38.5 % (ref 36.0–46.0)
HEMOGLOBIN: 12.7 g/dL (ref 12.0–15.0)
MCH: 29.8 pg (ref 26.0–34.0)
MCHC: 33 g/dL (ref 30.0–36.0)
MCV: 90.4 fL (ref 78.0–100.0)
Platelets: 400 10*3/uL (ref 150–400)
RBC: 4.26 MIL/uL (ref 3.87–5.11)
RDW: 14 % (ref 11.5–15.5)
WBC: 13.4 10*3/uL — ABNORMAL HIGH (ref 4.0–10.5)

## 2013-12-10 LAB — PRO B NATRIURETIC PEPTIDE: PRO B NATRI PEPTIDE: 761.6 pg/mL — AB (ref 0–450)

## 2013-12-10 LAB — CORTISOL-AM, BLOOD: Cortisol - AM: 21.1 ug/dL (ref 4.3–22.4)

## 2013-12-10 MED ORDER — DIPHENHYDRAMINE HCL 50 MG/ML IJ SOLN
25.0000 mg | Freq: Four times a day (QID) | INTRAMUSCULAR | Status: DC | PRN
  Administered 2013-12-10 – 2013-12-11 (×2): 25 mg via INTRAVENOUS
  Filled 2013-12-10 (×2): qty 1

## 2013-12-10 MED ORDER — IPRATROPIUM-ALBUTEROL 0.5-2.5 (3) MG/3ML IN SOLN
3.0000 mL | Freq: Four times a day (QID) | RESPIRATORY_TRACT | Status: DC
  Administered 2013-12-11 – 2013-12-12 (×3): 3 mL via RESPIRATORY_TRACT
  Filled 2013-12-10 (×4): qty 3

## 2013-12-10 MED ORDER — VANCOMYCIN HCL IN DEXTROSE 1-5 GM/200ML-% IV SOLN
1000.0000 mg | INTRAVENOUS | Status: DC
  Administered 2013-12-11: 1000 mg via INTRAVENOUS
  Filled 2013-12-10 (×2): qty 200

## 2013-12-10 MED ORDER — PIPERACILLIN-TAZOBACTAM 3.375 G IVPB 30 MIN: 3.3750 g | Freq: Three times a day (TID) | INTRAVENOUS | Status: DC

## 2013-12-10 MED ORDER — VANCOMYCIN HCL IN DEXTROSE 1-5 GM/200ML-% IV SOLN
1000.0000 mg | Freq: Once | INTRAVENOUS | Status: AC
  Administered 2013-12-10: 1000 mg via INTRAVENOUS
  Filled 2013-12-10: qty 200

## 2013-12-10 MED ORDER — PIPERACILLIN-TAZOBACTAM 3.375 G IVPB
3.3750 g | Freq: Three times a day (TID) | INTRAVENOUS | Status: DC
  Administered 2013-12-10 – 2013-12-12 (×6): 3.375 g via INTRAVENOUS
  Filled 2013-12-10 (×6): qty 50

## 2013-12-10 NOTE — Progress Notes (Signed)
Physical Therapy Treatment Patient Details Name: [name] MRN: [id number] DOB: 06/07/1940 Today's Date: 12/10/2013    History of Present Illness L hip fx - s/p hemiarthroplasty    PT Comments    POD # 2 am session.  Pt in bed on 4 lts and KI on.  Removed KI for OOB activity and instructed pt on 50% WB as pt was unable to recall.  Assisted from supine to EOB + 2 assist and increased time due to increased c/o pain and increased anxiety.  Assisted to [facility] + 2 asssit noted HR increased to 148 and dyspnea 4/4 with O2 sats 88% on 4 lts. .  Unable to attempt amb then assisted off BSC to recliner.  Increased time to position to comfort and position L LE.  HR on departure decreased to 115.    Follow Up Recommendations  SNF     Equipment Recommendations       Recommendations for Other Services       Precautions / Restrictions Precautions Precautions: Fall;Posterior Hip Precaution Comments: Sign hung on wall - all precautions reviewed.  Pt was NOT able to reclall any THP or WBing.  Re educated. Required Braces or Orthoses: Knee Immobilizer - Left Knee Immobilizer - Left: Other (comment) (on in bed for positioning and edhere to THP) Restrictions Weight Bearing Restrictions: Yes LUE Weight Bearing: Partial weight bearing LUE Partial Weight Bearing Percentage or Pounds: 50%    Mobility  Bed Mobility Overal bed mobility: +2 for physical assistance;Needs Assistance Bed Mobility: Supine to Sit     Supine to sit: Mod assist;+2 for physical assistance     General bed mobility comments: cues for sequence and use of R LE to self assist - assisted to/from EOB with pad  Transfers Overall transfer level: Needs assistance Equipment used: Rolling walker (2 wheeled) Transfers: Sit to/from [employer] Transfers Sit to Stand: +2 physical assistance;+2 safety/equipment;Max assist Stand pivot transfers: Max assist;+2 physical assistance;+2 safety/equipment       General transfer  comment: 100% VC's on proper tech, proper hand placement and to adhere to THP/50% WBing.  Assisted from elevated bed to [facility] 1/4 turn then from [facility] to recliner.  Ambulation/Gait         Gait velocity: unable to attempt 2nd HR increased to 148 during transfer and DOE 4/4       Stairs            Wheelchair Mobility    Modified Rankin (Stroke Patients Only)       Balance                                    Cognition                            Exercises      General Comments        Pertinent Vitals/Pain      Home Living                      Prior Function            PT Goals (current goals can now be found in the care plan section) Progress towards PT goals: Progressing toward goals    Frequency  Min 4X/week    PT Plan      Co-evaluation  End of Session Equipment Utilized During Treatment: Gait belt;Oxygen (4lts) Activity Tolerance: Patient limited by fatigue;Patient limited by pain Patient left: in chair;with call bell/phone within reach;with family/visitor present     Time: 0927-0956 PT Time Calculation (min): 29 min  Charges:  $Gait Training: 8-22 mins $Therapeutic Activity: 8-22 mins                    G Codes:      Felecia Shelling  PTA [facility]  Acute  Rehab Pager      [phone]

## 2013-12-10 NOTE — Progress Notes (Signed)
ANTIBIOTIC CONSULT NOTE - INITIAL  Pharmacy Consult for vancomycin Indication: pneumonia  No Known Allergies  Patient Measurements: Height:  (170.2 cm) Weight: 107 lb 12.9 oz (48.9 kg) IBW/kg (Calculated) : 61.6 Adjusted Body Weight:   Vital Signs:   Intake/Output from previous day: 09/18 0701 - 09/19 0700 In: 720 [P.O.:120; I.V.:600] Out: 400 [Urine:400] Intake/Output from this shift:    Labs:  Recent Labs  12/08/13 0453 12/08/13 1511 12/09/13 0430 12/10/13 0517  WBC 14.7* 24.2* 11.0* 13.4*  HGB 15.1* 15.1* 13.2 12.7  PLT 393 393 405* 400  CREATININE 0.45* 0.48* 0.56  --    Estimated Creatinine Clearance: 46.9 ml/min (by C-G formula based on Cr of 0.56). No results found for this basename: VANCOTROUGH, Leodis Binet, VANCORANDOM, GENTTROUGH, GENTPEAK, GENTRANDOM, TOBRATROUGH, TOBRAPEAK, TOBRARND, AMIKACINPEAK, AMIKACINTROU, AMIKACIN,  in the last 72 hours   Microbiology: Recent Results (from the past 720 hour(s))  URINE CULTURE     Status: None   Collection Time    12/07/13  1:47 AM      Result Value Ref Range Status   Specimen Description URINE, CATHETERIZED   Final   Special Requests NONE   Final   Culture  Setup Time     Final   Value: 12/07/2013 10:47     Performed at [facility] Count     Final   Value: NO GROWTH     Performed at [facility]   Culture     Final   Value: NO GROWTH     Performed at [facility]   Report Status 12/08/2013 FINAL   Final  CULTURE, BLOOD (ROUTINE X 2)     Status: None   Collection Time    12/07/13  5:30 AM      Result Value Ref Range Status   Specimen Description BLOOD LEFT ARM   Final   Special Requests BOTTLES DRAWN AEROBIC AND ANAEROBIC [facility]   Final   Culture  Setup Time     Final   Value: 12/07/2013 08:28     Performed at [facility]   Culture     Final   Value:        BLOOD CULTURE RECEIVED NO GROWTH TO DATE CULTURE WILL BE HELD FOR 5 DAYS BEFORE ISSUING A FINAL  NEGATIVE REPORT     Performed at [facility]   Report Status PENDING   Incomplete  CULTURE, BLOOD (ROUTINE X 2)     Status: None   Collection Time    12/07/13  5:35 AM      Result Value Ref Range Status   Specimen Description BLOOD RIGHT HAND   Final   Special Requests BOTTLES DRAWN AEROBIC ONLY 2CC   Final   Culture  Setup Time     Final   Value: 12/07/2013 08:29     Performed at [facility]   Culture     Final   Value:        BLOOD CULTURE RECEIVED NO GROWTH TO DATE CULTURE WILL BE HELD FOR 5 DAYS BEFORE ISSUING A FINAL NEGATIVE REPORT     Note: Culture results may be compromised due to an inadequate volume of blood received in culture bottles.     Performed at [facility]   Report Status PENDING   Incomplete  SURGICAL PCR SCREEN     Status: None   Collection Time    12/07/13  2:10 PM  Result Value Ref Range Status   MRSA, PCR NEGATIVE  NEGATIVE Final   Staphylococcus aureus NEGATIVE  NEGATIVE Final   Comment:            The Xpert SA Assay (FDA     approved for NASAL specimens     in patients over 75 years of age),     is one component of     a comprehensive surveillance     program.  Test performance has     been validated by [employer] for patients greater     than or equal to 75 year old.     It is not intended     to diagnose infection nor to     guide or monitor treatment.    Medical History: Past Medical History  Diagnosis Date  . Back pain   . Hypertension   . COPD (chronic obstructive pulmonary disease)   . Anxiety   . Hyperlipemia   . Compression fracture   . Hypercalcemia    Assessment: 73 YOF presented 9/15 with L hip fracture s/p repair 9/17.  The morning of 9/19 she developed respiratory failure with increased O2 needs. Orders to start vancomycin per pharmacy (zosyn per physician) for pneumonia.  9/19 >>vancomycin  >> 9/19 >> zosyn >>    Tmax: afebrile WBCs: elevated Renal: SCr WNL, normalized CrCL = 52ml/min  (using Scr = 0.8 for age)  9/16 blood: NGTD 9/16 urine: NG 9/19 blood:   Goal of Therapy:  Vancomycin trough level 15-20 mcg/ml  Plan:   Vancomycin 1gm IV x 1 then 1gm IV q24h  Check vancomycin trough if continued >4 days  Zosyn 3.375gm IV over 4h infusion as ordered  Juliette Alcide, PharmD, BCPS.   Pager: [phone]  12/10/2013,11:00 AM

## 2013-12-10 NOTE — Progress Notes (Signed)
Subjective: 2 Days Post-Op Procedure(s) (LRB): LEFT HIP HEMI ARTHROPLASTY  (Left) Patient reports pain as mild.  She denies any new complaints at this time. She is feeling better today. Appetite is normal. Denies any new HA, CP, SOB, N,V, fever, chills, calf pain or swelling.   Objective: Vital signs in last 24 hours: Temp:  [98.5 F (36.9 C)-98.6 F (37 C)] 98.6 F (37 C) (09/18 2200) Pulse Rate:  [86-87] 87 (09/18 2200) Resp:  [16-18] 16 (09/19 0400) BP: (138-147)/(65-66) 138/65 mmHg (09/18 2200) SpO2:  [90 %-99 %] 90 % (09/19 0833)  Intake/Output from previous day: 09/18 0701 - 09/19 0700 In: 720 [P.O.:120; I.V.:600] Out: 400 [Urine:400] Intake/Output this shift:     Recent Labs  12/08/13 0453 12/08/13 1511 12/09/13 0430 12/10/13 0517  HGB 15.1* 15.1* 13.2 12.7    Recent Labs  12/09/13 0430 12/10/13 0517  WBC 11.0* 13.4*  RBC 4.46 4.26  HCT 39.4 38.5  PLT 405* 400    Recent Labs  12/08/13 0453 12/08/13 1511 12/09/13 0430  NA 133*  --  135*  K 3.7  --  3.9  CL 95*  --  99  CO2 20  --  25  BUN 10  --  16  CREATININE 0.45* 0.48* 0.56  GLUCOSE 66*  --  102*  CALCIUM 9.2  --  8.7   No results found for this basename: LABPT, INR,  in the last 72 hours  WD/WN elderly female in nad. A and O x4. Mood and affect are appropriate. EOMI, wearing reading glasses. Respirations normal and unlabored with DeRidder in place. Abdomen soft and non-tender. Dressings to L hip C/D/I. NV intact with distal pulses at 2+ bilaterally. Distal sensation intact bilaterally.  5/5 strength of toe extensors and flexors bilaterally. Good quadriceps contration on L. No calf swelling or palpable cords. No lymphadenopathy.   Assessment/Plan: 2 Days Post-Op Procedure(s) (LRB): LEFT HIP HEMI ARTHROPLASTY  (Left) Discharge to SNF vs Home with HHPT and aids when medically appropriate  Continue Lovenox for DVT prophylaxis  Up with PT, PWB LLE 50% Knee immobilizer in bed Posterior hip  precautions   [name] 12/10/2013, 8:53 AM

## 2013-12-10 NOTE — Progress Notes (Addendum)
PROGRESS NOTE  [name] ZOX:[id number] DOB: [birth year] DOA: 12/06/2013 PCP: Gweneth Dimitri, MD  Interim summary 73 y.o. female PMHx anxiety, HTN, HLD COPD, tobacco use, hyponatremia, chronic back pain presented with a mechanical fall after tripping over her shoes causing her to fall at approximately 1530 on the day of admission. She hit her left side. Complains of moderate pain in her left low back. She denies head trauma or neck pain. No loss of consciousness. Patient fell 3 weeks ago and struck chest on floor.  She lives by herself but has 24-hour home health.  Her home aide also reports that the patient has been drinking at least a glass of one per day. X-rays in the emergency department revealed a left subcapital femoral neck fracture. Orthopedics was consulted. Left hip hemiarthroplasty was performed on 12/08/2013 by Dr. Shelle Iron. Postoperatively, the patient had increasing chest congestion and became hypoxemic. Chest x-ray revealed increasing left basilar density as well as right basilar density. The patient was started on vancomycin and cefepime for suspected HCAP. The patient was encouraged to use a flutter file, but was noncompliant.   Assessment/Plan: Acute hypoxemic respiratory failure -The patient had to be placed on 2 L nasal cannula 12/09/2013 evening-->now increased to 4L -Chest x-ray this am -Continue aerosolized albuterol and Atrovent -Pulmonary hygiene  -saline lock IVF -12/07/2013 echocardiogram showed EF 65-70%, grade 1 diastolic dysfunction, no WMA Left subcapital femoral neck fracture  -Secondary to mechanical fall  -appreciate Dr. Shelle Iron  -DVT prophylaxis per ortho  -12/08/2013--left hip hemiarthroplasty  -Pain control  -PT/OT-->SNF  Dyspnea on exertion/tobacco abuse  -Likely secondary to underlying COPD  -Continue bronchodilators  -tobacco cessation discussed  -Nicoderm patch  -Flutter valve  -12/08/2013 chest x-ray shows chronic interstitial markings    Hypertension  -Discontinue intravenous metoprolol  -Continue po metoprolol  -bp controlled Hyponatremia  -This appears to be chronic with baseline sodium 124-127  -Check TSH--0.372  -Am cortisol--21.1 -improved with IVF Leukocytosis  -partly stress demargination ,but cannot r/o PNA -Patient is afebrile and hemodynamically stable  -Follow blood cultures--neg  Alcohol abuse  -Alcohol withdrawal protocol  --CIWA= 0  Right hilar mass on chest x-ray  -12/06/2013 CT chest shows no lung masses, minimal basilar atelectasis  -Right hilar prominence is likely due to vasculature  -CT chest also shows chronic thoracic spine compression deformities with T6 vertebroplasty  Family Communication: Updated sister in law--[name] Hewett  Disposition Plan: SNF 12/10/13 or 12/11/13        Procedures/Studies: Dg Hip Complete Left  12/06/2013   CLINICAL DATA:  Left hip pain after falling today.  EXAM: LEFT HIP - COMPLETE 2+ VIEW  COMPARISON:  None.  FINDINGS: The bones are demineralized. There is a subcapital fracture of the left femur which appears is mildly displaced. There is no evidence of dislocation or pelvic fracture. The right femoral neck appears intact.  IMPRESSION: Mildly displaced subcapital fracture of the left femoral neck.   Electronically Signed   By: Roxy Horseman M.D.   On: 12/06/2013 18:07   Ct Chest W Contrast  12/07/2013   CLINICAL DATA:  Right hilar prominence noted on chest radiograph.  EXAM: CT CHEST WITH CONTRAST  TECHNIQUE: Multidetector CT imaging of the chest was performed during intravenous contrast administration.  CONTRAST:  80mL OMNIPAQUE IOHEXOL 300 MG/ML  SOLN  COMPARISON:  Chest radiograph performed earlier today at 7:31 p.m., and CTA of the chest performed 08/17/2012  FINDINGS: Minimal bibasilar atelectasis is noted.  The lungs are otherwise clear. No suspicious masses are seen. There is no evidence of pleural effusion or pneumothorax.  Prominence of the right hilum  reflects normal vasculature. There is no evidence of mediastinal lymphadenopathy. Scattered calcification is noted at the aortic and mitral valves. The great vessels are grossly unremarkable in appearance, aside from mild calcific atherosclerotic disease. No pericardial effusion is identified.  The thyroid gland is unremarkable in appearance. No axillary lymphadenopathy is seen  Minimal hypodensities within the periphery of the liver may reflect tiny cysts, but are nonspecific in appearance. The spleen is unremarkable in appearance.  No acute osseous abnormalities are identified. Multiple mild chronic compression deformities are noted throughout the thoracic spine, most prominent at T6 and T10. These are stable from 2014. The patient is status post vertebroplasty at T6.  IMPRESSION: 1. Apparent prominence of the right hilum reflects normal vasculature. No mass is seen. 2. Minimal bibasilar atelectasis noted; lungs otherwise clear. 3. Scattered calcification at the aortic and mitral valves. 4. Question of tiny hepatic cysts. 5. Multiple mild compression deformities along the thoracic spine, relatively stable in appearance from 2014.   Electronically Signed   By: Roanna Raider M.D.   On: 12/07/2013 00:36   Dg Pelvis Portable  12/08/2013   CLINICAL DATA:  Postoperative from left hip joint replacement  EXAM: PORTABLE PELVIS 1-2 VIEWS  COMPARISON:  CT scan of the pelvis dated 07 December 2013 prior to hip joint replacement.  FINDINGS: There is a prosthetic left hip joint in good position. The native bone of the proximal femur is normal in appearance. There is diffuse osteopenia of the visualized portions of the pelvis. There are skin staples present.  IMPRESSION: There is no immediate postprocedure complication following left hip joint replacement.   Electronically Signed   By: Montee Tallman  [place]   On: 12/08/2013 13:16   Ct Hip Left Wo Contrast  12/07/2013   CLINICAL DATA:  Fall 12/06/2013.  Subcapital femoral neck  fracture.  EXAM: CT OF THE LEFT HIP WITHOUT CONTRAST  TECHNIQUE: Multidetector CT imaging of the left hip was performed according to the standard protocol. Multiplanar CT image reconstructions were also generated.  COMPARISON:  12/06/2013.  FINDINGS: Subcapital LEFT femoral neck fracture. Mild impaction. Osteopenia is present with poor mineralization of the femur and visualized pelvic bones. Minimal LEFT hip osteoarthritis. No acetabular fracture is present. Hemarthrosis. Incidental visualization the anatomic pelvis shows colonic diverticulosis and atherosclerosis.  There is no intertrochanteric extension of the fracture. No destructive osseous lesions.  IMPRESSION: Nondisplaced mildly impacted subcapital LEFT femoral neck fracture.   Electronically Signed   By: Andreas Newport M.D.   On: 12/07/2013 09:56   Ct 3d Recon At Scanner  12/07/2013   CLINICAL DATA:  Nonspecific (abnormal) findings on radiological and other examination of musculoskeletal system. Subcapital LEFT femoral neck fracture  EXAM: 3-DIMENSIONAL CT IMAGE RENDERING ON ACQUISITION WORKSTATION  TECHNIQUE: 3-dimensional CT images were rendered by post-processing of the original CT data on an acquisition workstation. The 3-dimensional CT images were interpreted and findings were reported in the accompanying complete CT report for this study  COMPARISON:  CT today.  FINDINGS: Subcapital LEFT femoral neck fracture is again noted.  IMPRESSION: Subcapital LEFT femoral neck fracture.   Electronically Signed   By: Andreas Newport M.D.   On: 12/07/2013 09:57   Dg Chest Port 1 View  12/08/2013   CLINICAL DATA:  Bloody sputum ; status post left hip joint replacement  EXAM: PORTABLE CHEST - 1 VIEW  COMPARISON:  Portable chest x-ray of December 06, 2013  FINDINGS: The lungs are well-expanded and exhibit no alveolar infiltrate nor parenchymal mass. The interstitial markings are coarse but stable. The heart and pulmonary vascularity are within the limits of  normal. There is mild stable tortuosity of the descending thoracic aorta. The patient has undergone previous kyphoplasty at T6.  IMPRESSION: COPD.  There is no acute cardiopulmonary abnormality.   Electronically Signed   By: Christhoper Busbee  [place]   On: 12/08/2013 13:20   Dg Chest Port 1 View  12/06/2013   CLINICAL DATA:  Fall.  Smoker.  Cough.  EXAM: PORTABLE CHEST - 1 VIEW  COMPARISON:  01/19/2013  FINDINGS: Heart is mildly enlarged. There is prominence of interstitial markings. Perihilar peribronchial thickening is present. There are no focal consolidations. No pleural effusion. No pneumothorax. There is question of a mass in the right hilar region. Further evaluation with chest CT is recommended. Intravenous contrast is recommended unless contraindicated. No acute, displaced fracture.  IMPRESSION: 1. Cardiomegaly without pulmonary edema. 2. Bronchitic changes. 3. Question of right hilar mass or adenopathy. Further evaluation with CT of the chest is recommended.   Electronically Signed   By: Rosalie Gums M.D.   On: 12/06/2013 20:00   Dg Hip Portable 1 View Left  12/08/2013   CLINICAL DATA:  Status post left hip joint replacement.  EXAM: PORTABLE LEFT HIP - 1 VIEW  COMPARISON:  AP pelvis of today's date  FINDINGS: The left hip joint prosthesis is in good position radiographically. The interface with the native bone of the proximal femur is good. There is skin staples present.  IMPRESSION: There is no immediate postprocedure complication following left hip joint replacement.   Electronically Signed   By: Kian Gamarra  [place]   On: 12/08/2013 13:16         Subjective: Patient complains of shortness of breath this morning without any chest pain. She denies any fevers, chills, nausea, vomiting, diarrhea, vomiting, dysuria. She complains of a cough that is nonproductive. No hemoptysis.   Objective: Filed Vitals:   12/10/13 0000 12/10/13 0203 12/10/13 0400 12/10/13 0833  BP:      Pulse:      Temp:      TempSrc:       Resp: 16  16   Height:      Weight:      SpO2:  91% 91% 90%    Intake/Output Summary (Last 24 hours) at 12/10/13 0837 Last data filed at 12/10/13 0700  Gross per 24 hour  Intake    720 ml  Output    400 ml  Net    320 ml   Weight change:  Exam:   General:  Pt is alert, follows commands appropriately, not in acute distress  HEENT: No icterus, No thrush, NNC/AT  Cardiovascular: RRR, S1/S2, no rubs, no gallops  Respiratory: Bibasilar crackles. Diminished breath sounds with left rhonchi. No wheezing.   Abdomen: Soft/+BS, non tender, non distended, no guarding  Extremities: No edema, No lymphangitis, No petechiae, No rashes, no synovitis  Data Reviewed: Basic Metabolic Panel:  Recent Labs Lab 12/06/13 1842 12/08/13 0453 12/08/13 1511 12/09/13 0430  NA 132* 133*  --  135*  K 3.9 3.7  --  3.9  CL 91* 95*  --  99  CO2 26 20  --  25  GLUCOSE 86 66*  --  102*  BUN 17 10  --  16  CREATININE 0.61 0.45* 0.48* 0.56  CALCIUM 9.6 9.2  --  8.7  MG  --   --   --  1.9   Liver Function Tests:  Recent Labs Lab 12/08/13 0453  AST 17  ALT 20  ALKPHOS 88  BILITOT 0.7  PROT 6.5  ALBUMIN 3.0*   No results found for this basename: LIPASE, AMYLASE,  in the last 168 hours No results found for this basename: AMMONIA,  in the last 168 hours CBC:  Recent Labs Lab 12/06/13 1842 12/08/13 0453 12/08/13 1511 12/09/13 0430 12/10/13 0517  WBC 15.9* 14.7* 24.2* 11.0* 13.4*  NEUTROABS 12.7*  --   --   --   --   HGB 14.8 15.1* 15.1* 13.2 12.7  HCT 44.3 45.2 44.5 39.4 38.5  MCV 89.3 89.5 88.6 88.3 90.4  PLT 443* 393 393 405* 400   Cardiac Enzymes: No results found for this basename: CKTOTAL, CKMB, CKMBINDEX, TROPONINI,  in the last 168 hours BNP: No components found with this basename: POCBNP,  CBG: No results found for this basename: GLUCAP,  in the last 168 hours  Recent Results (from the past 240 hour(s))  URINE CULTURE     Status: None   Collection Time     12/07/13  1:47 AM      Result Value Ref Range Status   Specimen Description URINE, CATHETERIZED   Final   Special Requests NONE   Final   Culture  Setup Time     Final   Value: 12/07/2013 10:47     Performed at [facility] Count     Final   Value: NO GROWTH     Performed at [facility]   Culture     Final   Value: NO GROWTH     Performed at [facility]   Report Status 12/08/2013 FINAL   Final  CULTURE, BLOOD (ROUTINE X 2)     Status: None   Collection Time    12/07/13  5:30 AM      Result Value Ref Range Status   Specimen Description BLOOD LEFT ARM   Final   Special Requests BOTTLES DRAWN AEROBIC AND ANAEROBIC [facility]   Final   Culture  Setup Time     Final   Value: 12/07/2013 08:28     Performed at [facility]   Culture     Final   Value:        BLOOD CULTURE RECEIVED NO GROWTH TO DATE CULTURE WILL BE HELD FOR 5 DAYS BEFORE ISSUING A FINAL NEGATIVE REPORT     Performed at [facility]   Report Status PENDING   Incomplete  CULTURE, BLOOD (ROUTINE X 2)     Status: None   Collection Time    12/07/13  5:35 AM      Result Value Ref Range Status   Specimen Description BLOOD RIGHT HAND   Final   Special Requests BOTTLES DRAWN AEROBIC ONLY 2CC   Final   Culture  Setup Time     Final   Value: 12/07/2013 08:29     Performed at [facility]   Culture     Final   Value:        BLOOD CULTURE RECEIVED NO GROWTH TO DATE CULTURE WILL BE HELD FOR 5 DAYS BEFORE ISSUING A FINAL NEGATIVE REPORT     Note: Culture results may be compromised due to an inadequate volume of blood received in culture bottles.     Performed at [facility]  Report Status PENDING   Incomplete  SURGICAL PCR SCREEN     Status: None   Collection Time    12/07/13  2:10 PM      Result Value Ref Range Status   MRSA, PCR NEGATIVE  NEGATIVE Final   Staphylococcus aureus NEGATIVE  NEGATIVE Final   Comment:            The Xpert SA Assay (FDA     approved  for NASAL specimens     in patients over 45 years of age),     is one component of     a comprehensive surveillance     program.  Test performance has     been validated by [employer] for patients greater     than or equal to 51 year old.     It is not intended     to diagnose infection nor to     guide or monitor treatment.     Scheduled Meds: . dextromethorphan-guaiFENesin  1 tablet Oral BID  . docusate sodium  100 mg Oral BID  . enoxaparin (LOVENOX) injection  40 mg Subcutaneous Q24H  . feeding supplement (ENSURE COMPLETE)  237 mL Oral Q24H  . folic acid  1 mg Oral Daily  . ipratropium-albuterol  3 mL Nebulization Q6H  . metoprolol tartrate  25 mg Oral BID  . multivitamin with minerals  1 tablet Oral Daily  . nicotine  21 mg Transdermal Daily  . thiamine  100 mg Oral Daily   Or  . thiamine  100 mg Intravenous Daily   Continuous Infusions: . sodium chloride Stopped (12/08/13 0956)  . sodium chloride 0.9 % 1,000 mL with potassium chloride 20 mEq infusion 50 mL/hr at 12/08/13 1631     Kadey Mihalic, DO  [facility] Pager [phone]  If 7PM-7AM, please contact night-coverage [email or web address] Password [email or web address] 12/10/2013, 8:37 AM   LOS: 4 days

## 2013-12-10 NOTE — Progress Notes (Signed)
While giving patient a bath, RN and NT noticed a red rash all over patient's back and bottom. Patient stated it was itchy. RN notified MD and MD stated to monitor the rash and gave an order for benadryl  IV q6hrsPRN

## 2013-12-11 LAB — CBC
HEMATOCRIT: 36.7 % (ref 36.0–46.0)
HEMOGLOBIN: 12.2 g/dL (ref 12.0–15.0)
MCH: 29.5 pg (ref 26.0–34.0)
MCHC: 33.2 g/dL (ref 30.0–36.0)
MCV: 88.6 fL (ref 78.0–100.0)
Platelets: 378 10*3/uL (ref 150–400)
RBC: 4.14 MIL/uL (ref 3.87–5.11)
RDW: 13.6 % (ref 11.5–15.5)
WBC: 13.8 10*3/uL — ABNORMAL HIGH (ref 4.0–10.5)

## 2013-12-11 LAB — OSMOLALITY, URINE: OSMOLALITY UR: 262 mosm/kg — AB (ref 390–1090)

## 2013-12-11 LAB — BASIC METABOLIC PANEL
Anion gap: 9 (ref 5–15)
BUN: 7 mg/dL (ref 6–23)
CO2: 25 mEq/L (ref 19–32)
Calcium: 8.1 mg/dL — ABNORMAL LOW (ref 8.4–10.5)
Chloride: 92 mEq/L — ABNORMAL LOW (ref 96–112)
Creatinine, Ser: 0.43 mg/dL — ABNORMAL LOW (ref 0.50–1.10)
GFR calc Af Amer: >90 mL/min (ref >90)
Glucose, Bld: 103 mg/dL — ABNORMAL HIGH (ref 70–99)
POTASSIUM: 4 meq/L (ref 3.7–5.3)
Sodium: 126 mEq/L — ABNORMAL LOW (ref 137–147)

## 2013-12-11 LAB — OSMOLALITY: OSMOLALITY: 256 mosm/kg — AB (ref 275–300)

## 2013-12-11 LAB — PROCALCITONIN: Procalcitonin: <0.1 ng/mL

## 2013-12-11 NOTE — Progress Notes (Signed)
Family refused neb tx. Pt is sleeping no distress noted at this time.

## 2013-12-11 NOTE — Progress Notes (Signed)
Subjective: 3 Days Post-Op Procedure(s) (LRB): LEFT HIP HEMI ARTHROPLASTY  (Left) Patient reports pain as 3 on 0-10 scale.   Awake alert answers questions with a family member at bedside Objective: Vital signs in last 24 hours: Temp:  [98 F (36.7 C)-98.4 F (36.9 C)] 98 F (36.7 C) (09/20 0421) Pulse Rate:  [82-131] 88 (09/20 0421) Resp:  [20] 20 (09/20 0421) BP: (110-148)/(71-86) 117/86 mmHg (09/20 0421) SpO2:  [94 %-100 %] 95 % (09/20 0421)  Intake/Output from previous day: 09/19 0701 - 09/20 0700 In: 260 [I.V.:110; IV Piggyback:150] Out: -  Intake/Output this shift:     Recent Labs  12/08/13 1511 12/09/13 0430 12/10/13 0517 12/11/13 0600  HGB 15.1* 13.2 12.7 12.2    Recent Labs  12/10/13 0517 12/11/13 0600  WBC 13.4* 13.8*  RBC 4.26 4.14  HCT 38.5 36.7  PLT 400 378    Recent Labs  12/09/13 0430 12/11/13 0600  NA 135* 126*  K 3.9 4.0  CL 99 92*  CO2 25 25  BUN 16 7  CREATININE 0.56 0.43*  GLUCOSE 102* 103*  CALCIUM 8.7 8.1*   No results found for this basename: LABPT, INR,  in the last 72 hours  Incision: dressing C/D/I Moves left foot DF and PF on command  Assessment/Plan: 3 Days Post-Op Procedure(s) (LRB): LEFT HIP HEMI ARTHROPLASTY  (Left) Up with therapy  Continue current therapies  [name] [name] 12/11/2013, 9:35 AM

## 2013-12-11 NOTE — Progress Notes (Signed)
PROGRESS NOTE  [name] ZOX:[id number] DOB: Oct 11, 1940 DOA: 12/06/2013 PCP: Gweneth Dimitri, MD  Interim summary  73 y.o. female PMHx anxiety, HTN, HLD COPD, tobacco use, hyponatremia, chronic back pain presented with a mechanical fall after tripping over her shoes causing her to fall at approximately 1530 on the day of admission. She hit her left side. Complains of moderate pain in her left low back. She denies head trauma or neck pain. No loss of consciousness. Patient fell 3 weeks ago and struck chest on floor. She lives by herself but has 24-hour home health. Her home aide also reports that the patient has been drinking at least a glass of one per day. X-rays in the emergency department revealed a left subcapital femoral neck fracture. Orthopedics was consulted. Left hip hemiarthroplasty was performed on 12/08/2013 by Dr. Shelle Iron. Postoperatively, the patient had increasing chest congestion and became hypoxemic. Chest x-ray revealed increasing left basilar density as well as right basilar density. The patient was started on vancomycin and cefepime for suspected HCAP. The patient was encouraged to use a flutter valve, but was noncompliant.  Assessment/Plan:  Acute hypoxemic respiratory failure  -The patient had to be placed on 2 L nasal cannula 12/09/2013 evening-->now increased to 4L, remains stable -Chest x-ray 12/10/13--Slightly increased density at the left lung base with small left  pleural effusion  -Continue aerosolized albuterol and Atrovent  -Pulmonary hygiene  -saline lock IVF  -12/07/2013 echocardiogram showed EF 65-70%, grade 1 diastolic dysfunction, no WMA  Left subcapital femoral neck fracture  -Secondary to mechanical fall  -appreciate Dr. Shelle Iron  -DVT prophylaxis per ortho  -12/08/2013--left hip hemiarthroplasty  -Pain control  -PT/OT-->SNF  Dyspnea on exertion/tobacco abuse  -Likely secondary to underlying COPD  -Continue bronchodilators  -tobacco cessation  discussed  -Nicoderm patch  -Flutter valve  -12/08/2013 chest x-ray shows chronic interstitial markings  Hypertension  -Discontinue intravenous metoprolol  -Continue po metoprolol  -bp controlled  Hyponatremia  -This appears to be chronic with baseline sodium 124-127  -Check TSH--0.372  -Am cortisol--21.1  -Urine sodium, urine creatinine, urine osmolarity, serum osmolarity Leukocytosis  -partly stress demargination ,but cannot r/o PNA  -Patient is afebrile and hemodynamically stable  -Follow blood cultures--neg  -check procalcitonin Alcohol abuse  -Alcohol withdrawal protocol  --CIWA= 0  Right hilar mass on chest x-ray  -12/06/2013 CT chest shows no lung masses, minimal basilar atelectasis  -Right hilar prominence is likely due to vasculature  -CT chest also shows chronic thoracic spine compression deformities with T6 vertebroplasty  Family Communication: Updated sister in law--[name] Hewett  Disposition Plan: SNF 09/21 if stable  Antibiotics:  vanco  12/10/13>>>  Zosyn 12/10/13>>>    Procedures/Studies: Dg Hip Complete Left  12/06/2013   CLINICAL DATA:  Left hip pain after falling today.  EXAM: LEFT HIP - COMPLETE 2+ VIEW  COMPARISON:  None.  FINDINGS: The bones are demineralized. There is a subcapital fracture of the left femur which appears is mildly displaced. There is no evidence of dislocation or pelvic fracture. The right femoral neck appears intact.  IMPRESSION: Mildly displaced subcapital fracture of the left femoral neck.   Electronically Signed   By: Roxy Horseman M.D.   On: 12/06/2013 18:07   Ct Chest W Contrast  12/07/2013   CLINICAL DATA:  Right hilar prominence noted on chest radiograph.  EXAM: CT CHEST WITH CONTRAST  TECHNIQUE: Multidetector CT imaging of the chest was performed during intravenous contrast administration.  CONTRAST:  80mL OMNIPAQUE IOHEXOL 300 MG/ML  SOLN  COMPARISON:  Chest radiograph performed earlier today at 7:31 p.m., and CTA of the chest  performed 08/17/2012  FINDINGS: Minimal bibasilar atelectasis is noted. The lungs are otherwise clear. No suspicious masses are seen. There is no evidence of pleural effusion or pneumothorax.  Prominence of the right hilum reflects normal vasculature. There is no evidence of mediastinal lymphadenopathy. Scattered calcification is noted at the aortic and mitral valves. The great vessels are grossly unremarkable in appearance, aside from mild calcific atherosclerotic disease. No pericardial effusion is identified.  The thyroid gland is unremarkable in appearance. No axillary lymphadenopathy is seen  Minimal hypodensities within the periphery of the liver may reflect tiny cysts, but are nonspecific in appearance. The spleen is unremarkable in appearance.  No acute osseous abnormalities are identified. Multiple mild chronic compression deformities are noted throughout the thoracic spine, most prominent at T6 and T10. These are stable from 2014. The patient is status post vertebroplasty at T6.  IMPRESSION: 1. Apparent prominence of the right hilum reflects normal vasculature. No mass is seen. 2. Minimal bibasilar atelectasis noted; lungs otherwise clear. 3. Scattered calcification at the aortic and mitral valves. 4. Question of tiny hepatic cysts. 5. Multiple mild compression deformities along the thoracic spine, relatively stable in appearance from 2014.   Electronically Signed   By: Roanna Raider M.D.   On: 12/07/2013 00:36   Dg Pelvis Portable  12/08/2013   CLINICAL DATA:  Postoperative from left hip joint replacement  EXAM: PORTABLE PELVIS 1-2 VIEWS  COMPARISON:  CT scan of the pelvis dated 07 December 2013 prior to hip joint replacement.  FINDINGS: There is a prosthetic left hip joint in good position. The native bone of the proximal femur is normal in appearance. There is diffuse osteopenia of the visualized portions of the pelvis. There are skin staples present.  IMPRESSION: There is no immediate  postprocedure complication following left hip joint replacement.   Electronically Signed   By: Zeph Riebel  [place]   On: 12/08/2013 13:16   Ct Hip Left Wo Contrast  12/07/2013   CLINICAL DATA:  Fall 12/06/2013.  Subcapital femoral neck fracture.  EXAM: CT OF THE LEFT HIP WITHOUT CONTRAST  TECHNIQUE: Multidetector CT imaging of the left hip was performed according to the standard protocol. Multiplanar CT image reconstructions were also generated.  COMPARISON:  12/06/2013.  FINDINGS: Subcapital LEFT femoral neck fracture. Mild impaction. Osteopenia is present with poor mineralization of the femur and visualized pelvic bones. Minimal LEFT hip osteoarthritis. No acetabular fracture is present. Hemarthrosis. Incidental visualization the anatomic pelvis shows colonic diverticulosis and atherosclerosis.  There is no intertrochanteric extension of the fracture. No destructive osseous lesions.  IMPRESSION: Nondisplaced mildly impacted subcapital LEFT femoral neck fracture.   Electronically Signed   By: Andreas Newport M.D.   On: 12/07/2013 09:56   Ct 3d Recon At Scanner  12/07/2013   CLINICAL DATA:  Nonspecific (abnormal) findings on radiological and other examination of musculoskeletal system. Subcapital LEFT femoral neck fracture  EXAM: 3-DIMENSIONAL CT IMAGE RENDERING ON ACQUISITION WORKSTATION  TECHNIQUE: 3-dimensional CT images were rendered by post-processing of the original CT data on an acquisition workstation. The 3-dimensional CT images were interpreted and findings were reported in the accompanying complete CT report for this study  COMPARISON:  CT today.  FINDINGS: Subcapital LEFT femoral neck fracture is again noted.  IMPRESSION: Subcapital LEFT femoral neck fracture.   Electronically Signed   By: Andreas Newport M.D.   On:  12/07/2013 09:57   Dg Chest Port 1 View  12/10/2013   CLINICAL DATA:  Shortness of breath  EXAM: PORTABLE CHEST - 1 VIEW  COMPARISON:  Portable chest x-ray of December 08, 2013  FINDINGS:  The lungs are well-expanded. The interstitial markings remain coarse. There is no alveolar infiltrate. The left hemidiaphragm is slightly less well demonstrated today. The a trace of pleural fluid is suspected. The cardiac silhouette and pulmonary vascularity are normal. The bony thorax is unremarkable. Patient has undergone previous kyphoplasty of approximately T6  IMPRESSION: Slightly increased density at the left lung base with small left pleural effusion has developed since the earlier study. When the patient can tolerate the procedure, a PA and lateral chest x-ray would be useful.   Electronically Signed   By: Shatoya Roets  [place]   On: 12/10/2013 09:30   Dg Chest Port 1 View  12/08/2013   CLINICAL DATA:  Bloody sputum ; status post left hip joint replacement  EXAM: PORTABLE CHEST - 1 VIEW  COMPARISON:  Portable chest x-ray of December 06, 2013  FINDINGS: The lungs are well-expanded and exhibit no alveolar infiltrate nor parenchymal mass. The interstitial markings are coarse but stable. The heart and pulmonary vascularity are within the limits of normal. There is mild stable tortuosity of the descending thoracic aorta. The patient has undergone previous kyphoplasty at T6.  IMPRESSION: COPD.  There is no acute cardiopulmonary abnormality.   Electronically Signed   By: Vinie Charity  [place]   On: 12/08/2013 13:20   Dg Chest Port 1 View  12/06/2013   CLINICAL DATA:  Fall.  Smoker.  Cough.  EXAM: PORTABLE CHEST - 1 VIEW  COMPARISON:  01/19/2013  FINDINGS: Heart is mildly enlarged. There is prominence of interstitial markings. Perihilar peribronchial thickening is present. There are no focal consolidations. No pleural effusion. No pneumothorax. There is question of a mass in the right hilar region. Further evaluation with chest CT is recommended. Intravenous contrast is recommended unless contraindicated. No acute, displaced fracture.  IMPRESSION: 1. Cardiomegaly without pulmonary edema. 2. Bronchitic changes. 3. Question  of right hilar mass or adenopathy. Further evaluation with CT of the chest is recommended.   Electronically Signed   By: Rosalie Gums M.D.   On: 12/06/2013 20:00   Dg Hip Portable 1 View Left  12/08/2013   CLINICAL DATA:  Status post left hip joint replacement.  EXAM: PORTABLE LEFT HIP - 1 VIEW  COMPARISON:  AP pelvis of today's date  FINDINGS: The left hip joint prosthesis is in good position radiographically. The interface with the native bone of the proximal femur is good. There is skin staples present.  IMPRESSION: There is no immediate postprocedure complication following left hip joint replacement.   Electronically Signed   By: Hadessah Grennan  [place]   On: 12/08/2013 13:16         Subjective: Patient complaint of left hip pain but otherwise denies fevers, chills, chest pain, shortness breath, nausea, vomiting, diarrhea, abdominal pain, dysuria.  Objective: Filed Vitals:   12/11/13 0421 12/11/13 1149 12/11/13 1450 12/11/13 1601  BP: 117/86  136/60   Pulse: 88  94   Temp: 98 F (36.7 C)  98.1 F (36.7 C)   TempSrc: Oral  Oral   Resp: 20  18   Height:      Weight:      SpO2: 95% 98% 96% 98%    Intake/Output Summary (Last 24 hours) at 12/11/13 1732 Last data filed at 12/11/13 1230  Gross per 24  hour  Intake    860 ml  Output      0 ml  Net    860 ml   Weight change:  Exam:   General:  Pt is alert, follows commands appropriately, not in acute distress  HEENT: No icterus, No thrush,  [place]/AT  Cardiovascular: RRR, S1/S2, no rubs, no gallops  Respiratory: Bilateral scattered rhonchi. No wheezing. Good air movement.  Abdomen: Soft/+BS, non tender, non distended, no guarding  Extremities: No edema, No lymphangitis, No petechiae, No rashes, no synovitis  Data Reviewed: Basic Metabolic Panel:  Recent Labs Lab 12/06/13 1842 12/08/13 0453 12/08/13 1511 12/09/13 0430 12/11/13 0600  NA 132* 133*  --  135* 126*  K 3.9 3.7  --  3.9 4.0  CL 91* 95*  --  99 92*  CO2 26 20  --   25 25  GLUCOSE 86 66*  --  102* 103*  BUN 17 10  --  16 7  CREATININE 0.61 0.45* 0.48* 0.56 0.43*  CALCIUM 9.6 9.2  --  8.7 8.1*  MG  --   --   --  1.9  --    Liver Function Tests:  Recent Labs Lab 12/08/13 0453  AST 17  ALT 20  ALKPHOS 88  BILITOT 0.7  PROT 6.5  ALBUMIN 3.0*   No results found for this basename: LIPASE, AMYLASE,  in the last 168 hours No results found for this basename: AMMONIA,  in the last 168 hours CBC:  Recent Labs Lab 12/06/13 1842 12/08/13 0453 12/08/13 1511 12/09/13 0430 12/10/13 0517 12/11/13 0600  WBC 15.9* 14.7* 24.2* 11.0* 13.4* 13.8*  NEUTROABS 12.7*  --   --   --   --   --   HGB 14.8 15.1* 15.1* 13.2 12.7 12.2  HCT 44.3 45.2 44.5 39.4 38.5 36.7  MCV 89.3 89.5 88.6 88.3 90.4 88.6  PLT 443* 393 393 405* 400 378   Cardiac Enzymes: No results found for this basename: CKTOTAL, CKMB, CKMBINDEX, TROPONINI,  in the last 168 hours BNP: No components found with this basename: POCBNP,  CBG: No results found for this basename: GLUCAP,  in the last 168 hours  Recent Results (from the past 240 hour(s))  URINE CULTURE     Status: None   Collection Time    12/07/13  1:47 AM      Result Value Ref Range Status   Specimen Description URINE, CATHETERIZED   Final   Special Requests NONE   Final   Culture  Setup Time     Final   Value: 12/07/2013 10:47     Performed at [facility] Count     Final   Value: NO GROWTH     Performed at [facility]   Culture     Final   Value: NO GROWTH     Performed at [facility]   Report Status 12/08/2013 FINAL   Final  CULTURE, BLOOD (ROUTINE X 2)     Status: None   Collection Time    12/07/13  5:30 AM      Result Value Ref Range Status   Specimen Description BLOOD LEFT ARM   Final   Special Requests BOTTLES DRAWN AEROBIC AND ANAEROBIC [facility]   Final   Culture  Setup Time     Final   Value: 12/07/2013 08:28     Performed at [facility]   Culture     Final    Value:  BLOOD CULTURE RECEIVED NO GROWTH TO DATE CULTURE WILL BE HELD FOR 5 DAYS BEFORE ISSUING A FINAL NEGATIVE REPORT     Performed at [facility]   Report Status PENDING   Incomplete  CULTURE, BLOOD (ROUTINE X 2)     Status: None   Collection Time    12/07/13  5:35 AM      Result Value Ref Range Status   Specimen Description BLOOD RIGHT HAND   Final   Special Requests BOTTLES DRAWN AEROBIC ONLY 2CC   Final   Culture  Setup Time     Final   Value: 12/07/2013 08:29     Performed at [facility]   Culture     Final   Value:        BLOOD CULTURE RECEIVED NO GROWTH TO DATE CULTURE WILL BE HELD FOR 5 DAYS BEFORE ISSUING A FINAL NEGATIVE REPORT     Note: Culture results may be compromised due to an inadequate volume of blood received in culture bottles.     Performed at [facility]   Report Status PENDING   Incomplete  SURGICAL PCR SCREEN     Status: None   Collection Time    12/07/13  2:10 PM      Result Value Ref Range Status   MRSA, PCR NEGATIVE  NEGATIVE Final   Staphylococcus aureus NEGATIVE  NEGATIVE Final   Comment:            The Xpert SA Assay (FDA     approved for NASAL specimens     in patients over 59 years of age),     is one component of     a comprehensive surveillance     program.  Test performance has     been validated by [facility] for patients greater     than or equal to 43 year old.     It is not intended     to diagnose infection nor to     guide or monitor treatment.  CULTURE, BLOOD (ROUTINE X 2)     Status: None   Collection Time    12/10/13 11:55 AM      Result Value Ref Range Status   Specimen Description BLOOD RIGHT ARM   Final   Special Requests     Final   Value: BOTTLES DRAWN AEROBIC AND ANAEROBIC 10 CC BOTH BOTTLES   Culture  Setup Time     Final   Value: 12/10/2013 18:22     Performed at [facility]   Culture     Final   Value:        BLOOD CULTURE RECEIVED NO GROWTH TO DATE CULTURE WILL BE HELD  FOR 5 DAYS BEFORE ISSUING A FINAL NEGATIVE REPORT     Performed at [facility]   Report Status PENDING   Incomplete  CULTURE, BLOOD (ROUTINE X 2)     Status: None   Collection Time    12/10/13 12:00 PM      Result Value Ref Range Status   Specimen Description BLOOD LEFT ARM   Final   Special Requests BOTTLES DRAWN AEROBIC ONLY 5CC BLUE BOTTLE   Final   Culture  Setup Time     Final   Value: 12/10/2013 18:23     Performed at [facility]   Culture     Final   Value:        BLOOD CULTURE RECEIVED  NO GROWTH TO DATE CULTURE WILL BE HELD FOR 5 DAYS BEFORE ISSUING A FINAL NEGATIVE REPORT     Performed at [facility]   Report Status PENDING   Incomplete     Scheduled Meds: . dextromethorphan-guaiFENesin  1 tablet Oral BID  . docusate sodium  100 mg Oral BID  . enoxaparin (LOVENOX) injection  40 mg Subcutaneous Q24H  . feeding supplement (ENSURE COMPLETE)  237 mL Oral Q24H  . folic acid  1 mg Oral Daily  . ipratropium-albuterol  3 mL Nebulization Q6H WA  . metoprolol tartrate  25 mg Oral BID  . multivitamin with minerals  1 tablet Oral Daily  . nicotine  21 mg Transdermal Daily  . piperacillin-tazobactam (ZOSYN)  IV  3.375 g Intravenous 3 times per day  . thiamine  100 mg Oral Daily   Or  . thiamine  100 mg Intravenous Daily  . vancomycin  1,000 mg Intravenous Q24H   Continuous Infusions: . sodium chloride Stopped (12/08/13 0956)     Nura Cahoon, DO  [facility] Pager [phone]  If 7PM-7AM, please contact night-coverage [email or web address] Password [email or web address] 12/11/2013, 5:32 PM   LOS: 5 days

## 2013-12-12 DIAGNOSIS — J189 Pneumonia, unspecified organism: Secondary | ICD-10-CM

## 2013-12-12 LAB — CBC
HCT: 36.8 % (ref 36.0–46.0)
Hemoglobin: 12.2 g/dL (ref 12.0–15.0)
MCH: 29.5 pg (ref 26.0–34.0)
MCHC: 33.2 g/dL (ref 30.0–36.0)
MCV: 88.9 fL (ref 78.0–100.0)
PLATELETS: 406 10*3/uL — AB (ref 150–400)
RBC: 4.14 MIL/uL (ref 3.87–5.11)
RDW: 13.5 % (ref 11.5–15.5)
WBC: 13.8 10*3/uL — AB (ref 4.0–10.5)

## 2013-12-12 LAB — BASIC METABOLIC PANEL
ANION GAP: 10 (ref 5–15)
BUN: 6 mg/dL (ref 6–23)
CALCIUM: 8.2 mg/dL — AB (ref 8.4–10.5)
CO2: 28 meq/L (ref 19–32)
Chloride: 91 mEq/L — ABNORMAL LOW (ref 96–112)
Creatinine, Ser: 0.47 mg/dL — ABNORMAL LOW (ref 0.50–1.10)
GFR calc Af Amer: >90 mL/min (ref >90)
Glucose, Bld: 105 mg/dL — ABNORMAL HIGH (ref 70–99)
Potassium: 3.8 mEq/L (ref 3.7–5.3)
SODIUM: 129 meq/L — AB (ref 137–147)

## 2013-12-12 LAB — CREATININE, URINE, RANDOM: Creatinine, Urine: 26.8 mg/dL

## 2013-12-12 LAB — SODIUM, URINE, RANDOM: Sodium, Ur: 56 mEq/L

## 2013-12-12 MED ORDER — BISACODYL 5 MG PO TBEC: 5.0000 mg | DELAYED_RELEASE_TABLET | Freq: Every day | ORAL | Status: DC | PRN

## 2013-12-12 MED ORDER — AMOXICILLIN-POT CLAVULANATE 875-125 MG PO TABS
1.0000 | ORAL_TABLET | Freq: Two times a day (BID) | ORAL | Status: DC
  Administered 2013-12-12: 1 via ORAL
  Filled 2013-12-12 (×2): qty 1

## 2013-12-12 MED ORDER — METOPROLOL TARTRATE 25 MG PO TABS: 25.0000 mg | ORAL_TABLET | Freq: Two times a day (BID) | ORAL | Status: DC

## 2013-12-12 MED ORDER — TIOTROPIUM BROMIDE MONOHYDRATE 18 MCG IN CAPS: 18.0000 ug | ORAL_CAPSULE | Freq: Every day | RESPIRATORY_TRACT | Status: DC

## 2013-12-12 MED ORDER — ENOXAPARIN SODIUM 40 MG/0.4ML ~~LOC~~ SOLN: 40.0000 mg | SUBCUTANEOUS | Status: DC

## 2013-12-12 MED ORDER — AMOXICILLIN-POT CLAVULANATE 875-125 MG PO TABS: 1.0000 | ORAL_TABLET | Freq: Two times a day (BID) | ORAL | Status: DC

## 2013-12-12 MED ORDER — FOLIC ACID 1 MG PO TABS: 1.0000 mg | ORAL_TABLET | Freq: Every day | ORAL | Status: DC

## 2013-12-12 MED ORDER — SODIUM CHLORIDE 0.9 % IV SOLN
INTRAVENOUS | Status: DC
Start: 2013-12-12 — End: 2013-12-12
  Administered 2013-12-12: 08:00:00 via INTRAVENOUS

## 2013-12-12 NOTE — Progress Notes (Signed)
Subjective: 4 Days Post-Op Procedure(s) (LRB): LEFT HIP HEMI ARTHROPLASTY  (Left) Patient reports pain as mild.  Resting comfortably this AM. Caregiver in room.  Objective: Vital signs in last 24 hours: Temp:  [98.1 F (36.7 C)-98.7 F (37.1 C)] 98.1 F (36.7 C) (09/21 0645) Pulse Rate:  [81-116] 81 (09/21 0645) Resp:  [18] 18 (09/21 0645) BP: (126-141)/(60-89) 126/89 mmHg (09/21 0645) SpO2:  [96 %-98 %] 98 % (09/21 0645)  Intake/Output from previous day: 09/20 0701 - 09/21 0700 In: 1260 [P.O.:960; IV Piggyback:300] Out: 150 [Urine:150] Intake/Output this shift:     Recent Labs  12/10/13 0517 12/11/13 0600 12/12/13 0442  HGB 12.7 12.2 12.2    Recent Labs  12/11/13 0600 12/12/13 0442  WBC 13.8* 13.8*  RBC 4.14 4.14  HCT 36.7 36.8  PLT 378 406*    Recent Labs  12/11/13 0600 12/12/13 0442  NA 126* 129*  K 4.0 3.8  CL 92* 91*  CO2 25 28  BUN 7 6  CREATININE 0.43* 0.47*  GLUCOSE 103* 105*  CALCIUM 8.1* 8.2*   No results found for this basename: LABPT, INR,  in the last 72 hours  Neurologically intact ABD soft Neurovascular intact Sensation intact distally Intact pulses distally Dorsiflexion/Plantar flexion intact Incision: dressing C/D/I and no drainage No cellulitis present Compartment soft no calf pain or sign of DVT  Assessment/Plan: 4 Days Post-Op Procedure(s) (LRB): LEFT HIP HEMI ARTHROPLASTY  (Left) Advance diet Up with therapy D/C IV fluids Continue PWB (50%) LLE Lovenox for DVT ppx Continue abx per hospitalist service for HCAP- Vanco and Zosyn Hyponatremia- chronic per medicine, baseline 124-127 Eventual D/C to SNF or home with HHPT and 24/7 caregivers, once she is medically stable and pneumonia improved Discussed with Dr. Elissa Lovett, Dayna Barker. 12/12/2013, 8:20 AM

## 2013-12-12 NOTE — Progress Notes (Signed)
11:01AM [facility] notified and nurse given report regarding pt's H&P and current LEFT hip Fx/Post op.

## 2013-12-12 NOTE — Progress Notes (Addendum)
Patient is set to discharge to [facility] today. Patient & sister-in-law, [name] aware. Discharge packet in [facility] - RN, [place] aware. [facility] called for transport.   Clinical Social Work Department CLINICAL SOCIAL WORK PLACEMENT NOTE 12/12/2013  Patient:  [name], [name]  Account Number:  [id number] Admit date:  12/06/2013  Clinical Social Worker:  Orpah Greek  Date/time:  12/09/2013 10:31 AM  Clinical Social Work is seeking post-discharge placement for this patient at the following level of care:   SKILLED NURSING   (*CSW will update this form in Epic as items are completed)   12/09/2013  Patient/family provided with [facility] Department of Clinical Social Work's list of facilities offering this level of care within the geographic area requested by the patient (or if unable, by the patient's family).  12/09/2013  Patient/family informed of their freedom to choose among providers that offer the needed level of care, that participate in Medicare, Medicaid or managed care program needed by the patient, have an available bed and are willing to accept the patient.  12/09/2013  Patient/family informed of [facility]' ownership interest in [facility], as well as of the fact that they are under no obligation to receive care at this facility.  PASARR submitted to EDS on 12/09/2013 PASARR number received on 12/09/2013  FL2 transmitted to all facilities in geographic area requested by pt/family on  12/09/2013 FL2 transmitted to all facilities within larger geographic area on   Patient informed that his/her managed care company has contracts with or will negotiate with  certain facilities, including the following:     Patient/family informed of bed offers received:  12/09/2013 Patient chooses bed at [facility] Physician recommends and patient chooses bed at    Patient to be transferred to [facility] on  12/12/2013 Patient to be transferred to facility by  [facility] Patient and family notified of transfer on 12/12/2013 Name of family member notified:  patient's sister-in-law, [name] via phone  The following physician request were entered in Epic:   Additional Comments:    Lincoln Maxin, LCSW [facility] Clinical Social Worker cell #: [phone]

## 2013-12-12 NOTE — Discharge Summary (Addendum)
Physician Discharge Summary  [name] ZOX:[id number] DOB: 11-Jun-1940 DOA: 12/06/2013  PCP: Gweneth Dimitri, MD  Admit date: 12/06/2013 Discharge date: 12/12/2013  Recommendations for Outpatient Follow-up:  1. Pt will need to follow up with PCP in 2 weeks post discharge 2. Please obtain BMP in 1 week 3. Please also check CBC in one week 4. Maintain 2 L nasal cannula, wean oxygen for oxygen saturation > 92%  Discharge Diagnoses:  Acute hypoxemic respiratory failure  -The patient had to be placed on 2 L nasal cannula 12/09/2013 evening-->now increased to 4L, remains stable  -Chest x-ray 12/10/13--Slightly increased density at the left lung base with small left pleural effusion  -The patient was started on intravenous vancomycin and Zosyn for HCAP -Clinically improved with decreased oxygenation--presently stable on 2 L [place] -Please wean off oxygen for oxygen saturation of >92% -Patient will be discharged with Augmentin bid x 5 days to complete 7 days of abx therapy -Continue aerosolized albuterol and Atrovent during hospitalization -start spiriva as outpt -Pulmonary hygiene  -12/07/2013 echocardiogram showed EF 65-70%, grade 1 diastolic dysfunction, no WMA  Left subcapital femoral neck fracture  -Secondary to mechanical fall  -appreciate Dr. Shelle Iron  -DVT prophylaxis per ortho-->lovenox  [place] x 14 more days  -12/08/2013--left hip hemiarthroplasty  -Pain control  -PT/OT-->SNF  -50% weight bearing on LLE per ortho recommendations Dyspnea on exertion/tobacco abuse  -Likely secondary to underlying COPD  -Continue bronchodilators  -tobacco cessation discussed  -Nicoderm patch  -Flutter valve  -12/08/2013 chest x-ray shows chronic interstitial markings  Moderate Malnutrition -nutrition consultation -nutritional supplements Hypertension  -Discontinue intravenous metoprolol  -Continue po metoprolol  -bp controlled  Hyponatremia  -This appears to be chronic with baseline sodium  124-127  -Check TSH--0.372  -Am cortisol--21.1  -urine osmolarity--262, serum osmolarity--256  -FeNa suggested a degree of volume depletion--pt given IVF with some improvement Leukocytosis  -partly stress demargination ,but cannot r/o PNA  -Patient is afebrile and hemodynamically stable  -Follow blood cultures--neg  -check procalcitonin--neg  Alcohol abuse  -Alcohol withdrawal protocol  --CIWA= 0  Right hilar mass on chest x-ray  -12/06/2013 CT chest shows no lung masses, minimal basilar atelectasis  -Right hilar prominence is likely due to vasculature  -CT chest also shows chronic thoracic spine compression deformities with T6 vertebroplasty  Family Communication: Updated sister in law--[name] Hewett  Disposition Plan: SNF 09/21 if stable  Antibiotics:  vanco 12/10/13>>>9/21  Zosyn 12/10/13>>>9/21   Discharge Condition: stable  Disposition: SNF Follow-up Information   Follow up with BEANE,JEFFREY C, MD In 2 weeks. (For suture removal)    Specialty:  Orthopedic Surgery   Contact information:   [address] [place] [address] [phone]       Follow up with [facility], MD In 2 weeks.   Specialty:  Family Medicine   Contact information:   [address] [place] [address] [phone]       Diet:soft diet Wt Readings from Last 3 Encounters:  12/07/13 48.9 kg (107 lb 12.9 oz)  12/07/13 48.9 kg (107 lb 12.9 oz)  09/16/12 50.621 kg (111 lb 9.6 oz)    History of present illness:  73 y.o. female PMHx anxiety, HTN, HLD COPD, tobacco use, hyponatremia, chronic back pain presented with a mechanical fall after tripping over her shoes causing her to fall at approximately 1530 on the day of admission. She hit her left side. Complains of moderate pain in her left low back. She denies head trauma or neck pain. No loss of  consciousness. Patient fell 3 weeks ago and struck chest on floor. She lives by herself but has 24-hour home health. Her home aide  also reports that the patient has been drinking at least a glass of one per day. X-rays in the emergency department revealed a left subcapital femoral neck fracture. Orthopedics was consulted. Left hip hemiarthroplasty was performed on 12/08/2013 by Dr. Shelle Iron. Postoperatively, the patient had increasing chest congestion and became hypoxemic. Chest x-ray revealed increasing left basilar density as well as right basilar density. The patient was started on vancomycin and cefepime for suspected HCAP. The patient was encouraged to use a flutter valve, but was noncompliant.      Consultants: Ortho--Dr. Shelle Iron  Discharge Exam: Filed Vitals:   12/12/13 0645  BP: 126/89  Pulse: 81  Temp: 98.1 F (36.7 C)  Resp: 18   Filed Vitals:   12/11/13 1601 12/11/13 2058 12/12/13 0645 12/12/13 0834  BP:  141/74 126/89   Pulse:  116 81   Temp:  98.7 F (37.1 C) 98.1 F (36.7 C)   TempSrc:  Oral Oral   Resp:  18 18   Height:      Weight:      SpO2: 98% 98% 98% 94%   General: A&O x 3, NAD, pleasant, cooperative Cardiovascular: RRR, no rub, no gallop, no S3 Respiratory: Bibasilar crackles. Left clear to auscultation. No wheezing. Abdomen:soft, nontender, nondistended, positive bowel sounds Extremities: No edema, No lymphangitis, no petechiae  Discharge Instructions      Discharge Instructions   Partial weight bearing    Complete by:  As directed   % Body Weight:  50%  Laterality:  left  Extremity:  Lower            Medication List         acetaminophen 325 MG tablet  Commonly known as:  TYLENOL  Take 650 mg by mouth every 6 (six) hours as needed for mild pain.     amoxicillin-clavulanate 875-125 MG per tablet  Commonly known as:  AUGMENTIN  Take 1 tablet by mouth every 12 (twelve) hours.     bisacodyl 5 MG EC tablet  Commonly known as:  DULCOLAX  Take 1 tablet (5 mg total) by mouth daily as needed for moderate constipation.     docusate sodium 100 MG capsule  Commonly known  as:  COLACE  Take 1 capsule (100 mg total) by mouth 2 (two) times daily.     enoxaparin 40 MG/0.4ML injection  Commonly known as:  LOVENOX  Inject 0.4 mLs (40 mg total) into the skin daily.     folic acid 1 MG tablet  Commonly known as:  FOLVITE  Take 1 tablet (1 mg total) by mouth daily.     HYDROcodone-acetaminophen 5-325 MG per tablet  Commonly known as:  NORCO  Take 1 tablet by mouth every 6 (six) hours as needed for moderate pain.     metoprolol tartrate 25 MG tablet  Commonly known as:  LOPRESSOR  Take 1 tablet (25 mg total) by mouth 2 (two) times daily.     tiotropium 18 MCG inhalation capsule  Commonly known as:  SPIRIVA HANDIHALER  Place 1 capsule (18 mcg total) into inhaler and inhale daily.         The results of significant diagnostics from this hospitalization (including imaging, microbiology, ancillary and laboratory) are listed below for reference.    Significant Diagnostic Studies: Dg Hip Complete Left  12/06/2013   CLINICAL DATA:  Left hip  pain after falling today.  EXAM: LEFT HIP - COMPLETE 2+ VIEW  COMPARISON:  None.  FINDINGS: The bones are demineralized. There is a subcapital fracture of the left femur which appears is mildly displaced. There is no evidence of dislocation or pelvic fracture. The right femoral neck appears intact.  IMPRESSION: Mildly displaced subcapital fracture of the left femoral neck.   Electronically Signed   By: Roxy Horseman M.D.   On: 12/06/2013 18:07   Ct Chest W Contrast  12/07/2013   CLINICAL DATA:  Right hilar prominence noted on chest radiograph.  EXAM: CT CHEST WITH CONTRAST  TECHNIQUE: Multidetector CT imaging of the chest was performed during intravenous contrast administration.  CONTRAST:  80mL OMNIPAQUE IOHEXOL 300 MG/ML  SOLN  COMPARISON:  Chest radiograph performed earlier today at 7:31 p.m., and CTA of the chest performed 08/17/2012  FINDINGS: Minimal bibasilar atelectasis is noted. The lungs are otherwise clear. No suspicious  masses are seen. There is no evidence of pleural effusion or pneumothorax.  Prominence of the right hilum reflects normal vasculature. There is no evidence of mediastinal lymphadenopathy. Scattered calcification is noted at the aortic and mitral valves. The great vessels are grossly unremarkable in appearance, aside from mild calcific atherosclerotic disease. No pericardial effusion is identified.  The thyroid gland is unremarkable in appearance. No axillary lymphadenopathy is seen  Minimal hypodensities within the periphery of the liver may reflect tiny cysts, but are nonspecific in appearance. The spleen is unremarkable in appearance.  No acute osseous abnormalities are identified. Multiple mild chronic compression deformities are noted throughout the thoracic spine, most prominent at T6 and T10. These are stable from 2014. The patient is status post vertebroplasty at T6.  IMPRESSION: 1. Apparent prominence of the right hilum reflects normal vasculature. No mass is seen. 2. Minimal bibasilar atelectasis noted; lungs otherwise clear. 3. Scattered calcification at the aortic and mitral valves. 4. Question of tiny hepatic cysts. 5. Multiple mild compression deformities along the thoracic spine, relatively stable in appearance from 2014.   Electronically Signed   By: Roanna Raider M.D.   On: 12/07/2013 00:36   Dg Pelvis Portable  12/08/2013   CLINICAL DATA:  Postoperative from left hip joint replacement  EXAM: PORTABLE PELVIS 1-2 VIEWS  COMPARISON:  CT scan of the pelvis dated 07 December 2013 prior to hip joint replacement.  FINDINGS: There is a prosthetic left hip joint in good position. The native bone of the proximal femur is normal in appearance. There is diffuse osteopenia of the visualized portions of the pelvis. There are skin staples present.  IMPRESSION: There is no immediate postprocedure complication following left hip joint replacement.   Electronically Signed   By: Nichele Slawson  [place]   On: 12/08/2013  13:16   Ct Hip Left Wo Contrast  12/07/2013   CLINICAL DATA:  Fall 12/06/2013.  Subcapital femoral neck fracture.  EXAM: CT OF THE LEFT HIP WITHOUT CONTRAST  TECHNIQUE: Multidetector CT imaging of the left hip was performed according to the standard protocol. Multiplanar CT image reconstructions were also generated.  COMPARISON:  12/06/2013.  FINDINGS: Subcapital LEFT femoral neck fracture. Mild impaction. Osteopenia is present with poor mineralization of the femur and visualized pelvic bones. Minimal LEFT hip osteoarthritis. No acetabular fracture is present. Hemarthrosis. Incidental visualization the anatomic pelvis shows colonic diverticulosis and atherosclerosis.  There is no intertrochanteric extension of the fracture. No destructive osseous lesions.  IMPRESSION: Nondisplaced mildly impacted subcapital LEFT femoral neck fracture.   Electronically Signed   By:  Andreas Newport M.D.   On: 12/07/2013 09:56   Ct 3d Recon At Scanner  12/07/2013   CLINICAL DATA:  Nonspecific (abnormal) findings on radiological and other examination of musculoskeletal system. Subcapital LEFT femoral neck fracture  EXAM: 3-DIMENSIONAL CT IMAGE RENDERING ON ACQUISITION WORKSTATION  TECHNIQUE: 3-dimensional CT images were rendered by post-processing of the original CT data on an acquisition workstation. The 3-dimensional CT images were interpreted and findings were reported in the accompanying complete CT report for this study  COMPARISON:  CT today.  FINDINGS: Subcapital LEFT femoral neck fracture is again noted.  IMPRESSION: Subcapital LEFT femoral neck fracture.   Electronically Signed   By: Andreas Newport M.D.   On: 12/07/2013 09:57   Dg Chest Port 1 View  12/10/2013   CLINICAL DATA:  Shortness of breath  EXAM: PORTABLE CHEST - 1 VIEW  COMPARISON:  Portable chest x-ray of December 08, 2013  FINDINGS: The lungs are well-expanded. The interstitial markings remain coarse. There is no alveolar infiltrate. The left hemidiaphragm  is slightly less well demonstrated today. The a trace of pleural fluid is suspected. The cardiac silhouette and pulmonary vascularity are normal. The bony thorax is unremarkable. Patient has undergone previous kyphoplasty of approximately T6  IMPRESSION: Slightly increased density at the left lung base with small left pleural effusion has developed since the earlier study. When the patient can tolerate the procedure, a PA and lateral chest x-ray would be useful.   Electronically Signed   By: Youa Deloney  [place]   On: 12/10/2013 09:30   Dg Chest Port 1 View  12/08/2013   CLINICAL DATA:  Bloody sputum ; status post left hip joint replacement  EXAM: PORTABLE CHEST - 1 VIEW  COMPARISON:  Portable chest x-ray of December 06, 2013  FINDINGS: The lungs are well-expanded and exhibit no alveolar infiltrate nor parenchymal mass. The interstitial markings are coarse but stable. The heart and pulmonary vascularity are within the limits of normal. There is mild stable tortuosity of the descending thoracic aorta. The patient has undergone previous kyphoplasty at T6.  IMPRESSION: COPD.  There is no acute cardiopulmonary abnormality.   Electronically Signed   By: Amardeep Beckers  [place]   On: 12/08/2013 13:20   Dg Chest Port 1 View  12/06/2013   CLINICAL DATA:  Fall.  Smoker.  Cough.  EXAM: PORTABLE CHEST - 1 VIEW  COMPARISON:  01/19/2013  FINDINGS: Heart is mildly enlarged. There is prominence of interstitial markings. Perihilar peribronchial thickening is present. There are no focal consolidations. No pleural effusion. No pneumothorax. There is question of a mass in the right hilar region. Further evaluation with chest CT is recommended. Intravenous contrast is recommended unless contraindicated. No acute, displaced fracture.  IMPRESSION: 1. Cardiomegaly without pulmonary edema. 2. Bronchitic changes. 3. Question of right hilar mass or adenopathy. Further evaluation with CT of the chest is recommended.   Electronically Signed   By: Rosalie Gums M.D.   On: 12/06/2013 20:00   Dg Hip Portable 1 View Left  12/08/2013   CLINICAL DATA:  Status post left hip joint replacement.  EXAM: PORTABLE LEFT HIP - 1 VIEW  COMPARISON:  AP pelvis of today's date  FINDINGS: The left hip joint prosthesis is in good position radiographically. The interface with the native bone of the proximal femur is good. There is skin staples present.  IMPRESSION: There is no immediate postprocedure complication following left hip joint replacement.   Electronically Signed   By: Kaya Pottenger  [place]   On:  12/08/2013 13:16     Microbiology: Recent Results (from the past 240 hour(s))  URINE CULTURE     Status: None   Collection Time    12/07/13  1:47 AM      Result Value Ref Range Status   Specimen Description URINE, CATHETERIZED   Final   Special Requests NONE   Final   Culture  Setup Time     Final   Value: 12/07/2013 10:47     Performed at [facility] Count     Final   Value: NO GROWTH     Performed at [facility]   Culture     Final   Value: NO GROWTH     Performed at [facility]   Report Status 12/08/2013 FINAL   Final  CULTURE, BLOOD (ROUTINE X 2)     Status: None   Collection Time    12/07/13  5:30 AM      Result Value Ref Range Status   Specimen Description BLOOD LEFT ARM   Final   Special Requests BOTTLES DRAWN AEROBIC AND ANAEROBIC [facility]   Final   Culture  Setup Time     Final   Value: 12/07/2013 08:28     Performed at [facility]   Culture     Final   Value:        BLOOD CULTURE RECEIVED NO GROWTH TO DATE CULTURE WILL BE HELD FOR 5 DAYS BEFORE ISSUING A FINAL NEGATIVE REPORT     Performed at [facility]   Report Status PENDING   Incomplete  CULTURE, BLOOD (ROUTINE X 2)     Status: None   Collection Time    12/07/13  5:35 AM      Result Value Ref Range Status   Specimen Description BLOOD RIGHT HAND   Final   Special Requests BOTTLES DRAWN AEROBIC ONLY 2CC   Final   Culture  Setup Time      Final   Value: 12/07/2013 08:29     Performed at [facility]   Culture     Final   Value:        BLOOD CULTURE RECEIVED NO GROWTH TO DATE CULTURE WILL BE HELD FOR 5 DAYS BEFORE ISSUING A FINAL NEGATIVE REPORT     Note: Culture results may be compromised due to an inadequate volume of blood received in culture bottles.     Performed at [facility]   Report Status PENDING   Incomplete  SURGICAL PCR SCREEN     Status: None   Collection Time    12/07/13  2:10 PM      Result Value Ref Range Status   MRSA, PCR NEGATIVE  NEGATIVE Final   Staphylococcus aureus NEGATIVE  NEGATIVE Final   Comment:            The Xpert SA Assay (FDA     approved for NASAL specimens     in patients over 51 years of age),     is one component of     a comprehensive surveillance     program.  Test performance has     been validated by [employer] for patients greater     than or equal to 64 year old.     It is not intended     to diagnose infection nor to     guide or monitor treatment.  CULTURE, BLOOD (ROUTINE X 2)  Status: None   Collection Time    12/10/13 11:55 AM      Result Value Ref Range Status   Specimen Description BLOOD RIGHT ARM   Final   Special Requests     Final   Value: BOTTLES DRAWN AEROBIC AND ANAEROBIC 10 CC BOTH BOTTLES   Culture  Setup Time     Final   Value: 12/10/2013 18:22     Performed at [facility]   Culture     Final   Value:        BLOOD CULTURE RECEIVED NO GROWTH TO DATE CULTURE WILL BE HELD FOR 5 DAYS BEFORE ISSUING A FINAL NEGATIVE REPORT     Performed at [facility]   Report Status PENDING   Incomplete  CULTURE, BLOOD (ROUTINE X 2)     Status: None   Collection Time    12/10/13 12:00 PM      Result Value Ref Range Status   Specimen Description BLOOD LEFT ARM   Final   Special Requests BOTTLES DRAWN AEROBIC ONLY 5CC BLUE BOTTLE   Final   Culture  Setup Time     Final   Value: 12/10/2013 18:23     Performed at [facility]   Culture     Final   Value:        BLOOD CULTURE RECEIVED NO GROWTH TO DATE CULTURE WILL BE HELD FOR 5 DAYS BEFORE ISSUING A FINAL NEGATIVE REPORT     Performed at [facility]   Report Status PENDING   Incomplete     Labs: Basic Metabolic Panel:  Recent Labs Lab 12/06/13 1842 12/08/13 0453 12/08/13 1511 12/09/13 0430 12/11/13 0600 12/12/13 0442  NA 132* 133*  --  135* 126* 129*  K 3.9 3.7  --  3.9 4.0 3.8  CL 91* 95*  --  99 92* 91*  CO2 26 20  --  25 25 28   GLUCOSE 86 66*  --  102* 103* 105*  BUN 17 10  --  16 7 6   CREATININE 0.61 0.45* 0.48* 0.56 0.43* 0.47*  CALCIUM 9.6 9.2  --  8.7 8.1* 8.2*  MG  --   --   --  1.9  --   --    Liver Function Tests:  Recent Labs Lab 12/08/13 0453  AST 17  ALT 20  ALKPHOS 88  BILITOT 0.7  PROT 6.5  ALBUMIN 3.0*   No results found for this basename: LIPASE, AMYLASE,  in the last 168 hours No results found for this basename: AMMONIA,  in the last 168 hours CBC:  Recent Labs Lab 12/06/13 1842  12/08/13 1511 12/09/13 0430 12/10/13 0517 12/11/13 0600 12/12/13 0442  WBC 15.9*  < > 24.2* 11.0* 13.4* 13.8* 13.8*  NEUTROABS 12.7*  --   --   --   --   --   --   HGB 14.8  < > 15.1* 13.2 12.7 12.2 12.2  HCT 44.3  < > 44.5 39.4 38.5 36.7 36.8  MCV 89.3  < > 88.6 88.3 90.4 88.6 88.9  PLT 443*  < > 393 405* 400 378 406*  < > = values in this interval not displayed. Cardiac Enzymes: No results found for this basename: CKTOTAL, CKMB, CKMBINDEX, TROPONINI,  in the last 168 hours BNP: No components found with this basename: POCBNP,  CBG: No results found for this basename: GLUCAP,  in the last 168 hours  Time coordinating discharge:  Greater than  30 minutes  Signed:  Bernarda Erck, DO [facility] Pager: [phone] 12/12/2013, 9:58 AM

## 2013-12-13 ENCOUNTER — Non-Acute Institutional Stay (SKILLED_NURSING_FACILITY): Payer: Medicare Other | Admitting: Internal Medicine

## 2013-12-13 DIAGNOSIS — J439 Emphysema, unspecified: Secondary | ICD-10-CM

## 2013-12-13 DIAGNOSIS — J438 Other emphysema: Secondary | ICD-10-CM

## 2013-12-13 DIAGNOSIS — J189 Pneumonia, unspecified organism: Secondary | ICD-10-CM

## 2013-12-13 DIAGNOSIS — I10 Essential (primary) hypertension: Secondary | ICD-10-CM

## 2013-12-13 DIAGNOSIS — S72009S Fracture of unspecified part of neck of unspecified femur, sequela: Secondary | ICD-10-CM

## 2013-12-13 DIAGNOSIS — S72002S Fracture of unspecified part of neck of left femur, sequela: Secondary | ICD-10-CM

## 2013-12-13 LAB — CULTURE, BLOOD (ROUTINE X 2)
CULTURE: NO GROWTH
Culture: NO GROWTH

## 2013-12-13 NOTE — Progress Notes (Signed)
HISTORY & PHYSICAL  DATE: 12/13/2013   FACILITY: [facility]  LEVEL OF CARE: SNF (31)  ALLERGIES:  No Known Allergies  CHIEF COMPLAINT:  Manage left femoral neck fracture, pneumonia, and COPD  HISTORY OF PRESENT ILLNESS: Patient is a 73 year old Caucasian female .  HIP FRACTURE: The patient had a mechanical fall and sustained a femur fracture.  Patient subsequently underwent left hip hemiarthroplasty and tolerated the procedure well. Patient is admitted to this facility for short-term rehabilitation. Patient denies hip pain currently. No complications reported from the pain medications currently being used.  PNEUMONIA: The pneumonia remains stable.  The patient denies ongoing chest pain, cough, shortness of breath, fever, chills or night sweats. No complications reported from the current antibiotic being used.  COPD: the COPD remains stable.  Pt denies sob, cough, wheezing or declining exercise tolerance.  No complications from the medications presently being used.  PAST MEDICAL HISTORY :  Past Medical History  Diagnosis Date  . Back pain   . Hypertension   . COPD (chronic obstructive pulmonary disease)   . Anxiety   . Hyperlipemia   . Compression fracture   . Hypercalcemia     PAST SURGICAL HISTORY: Past Surgical History  Procedure Laterality Date  . Back surgery    . Dental surgery    . Hip arthroplasty Left 12/08/2013    Procedure: LEFT HIP HEMI ARTHROPLASTY ;  Surgeon: Javier Docker, MD;  Location: [facility];  Service: Orthopedics;  Laterality: Left;    SOCIAL HISTORY:  reports that she has been smoking Cigarettes.  She has a 5 pack-year smoking history. She has never used smokeless tobacco. She reports that she drinks about 1.2 ounces of alcohol per week. She reports that she does not use illicit drugs.  FAMILY HISTORY: None  CURRENT MEDICATIONS: Reviewed per MAR/see medication list  REVIEW OF SYSTEMS:  See HPI otherwise 14 point ROS  is negative.  PHYSICAL EXAMINATION  VS:  See VS section  GENERAL: no acute distress, normal body habitus EYES: conjunctivae normal, sclerae normal, normal eye lids MOUTH/THROAT: lips without lesions,no lesions in the mouth,tongue is without lesions,uvula elevates in midline NECK: supple, trachea midline, no neck masses, no thyroid tenderness, no thyromegaly LYMPHATICS: no LAN in the neck, no supraclavicular LAN RESPIRATORY: breathing is even & unlabored, BS CTAB CARDIAC: RRR, no murmur,no extra heart sounds, no edema GI:  ABDOMEN: abdomen soft, normal BS, no masses, no tenderness  LIVER/SPLEEN: no hepatomegaly, no splenomegaly MUSCULOSKELETAL: HEAD: normal to inspection  EXTREMITIES: LEFT UPPER EXTREMITY: full range of motion, normal strength & tone RIGHT UPPER EXTREMITY:  full range of motion, normal strength & tone LEFT LOWER EXTREMITY:  range of motion not tested due to surgery, normal strength & tone RIGHT LOWER EXTREMITY: Minimal range of motion, normal strength & tone PSYCHIATRIC: the patient is alert & oriented to person, affect & behavior appropriate  LABS/RADIOLOGY:  Labs reviewed: Basic Metabolic Panel:  Recent Labs  16/10/96 0430 12/11/13 0600 12/12/13 0442  NA 135* 126* 129*  K 3.9 4.0 3.8  CL 99 92* 91*  CO2 25 25 28   GLUCOSE 102* 103* 105*  BUN 16 7 6   CREATININE 0.56 0.43* 0.47*  CALCIUM 8.7 8.1* 8.2*  MG 1.9  --   --    Liver Function Tests:  Recent Labs  12/08/13 0453  AST 17  ALT 20  ALKPHOS 88  BILITOT 0.7  PROT 6.5  ALBUMIN 3.0*  CBC:  Recent Labs  12/06/13 1842  12/10/13 0517 12/11/13 0600 12/12/13 0442  WBC 15.9*  < > 13.4* 13.8* 13.8*  NEUTROABS 12.7*  --   --   --   --   HGB 14.8  < > 12.7 12.2 12.2  HCT 44.3  < > 38.5 36.7 36.8  MCV 89.3  < > 90.4 88.6 88.9  PLT 443*  < > 400 378 406*  < > = values in this interval not displayed.   Transthoracic Echocardiography  Patient:    Kennley, Schwandt MR #:        [id number] Study Date: 12/07/2013 Gender:     F Age:        73 Height:     170.2 cm Weight:     48.9 kg BSA:        1.51 m^2 Pt. Status: Room:       1445   SONOGRAPHER  [facility], RDCS  ATTENDING    Catarina Hartshorn [id number]  ADMITTING    Joseph Art, Deniece Ree J  PERFORMING   [facility], Inpatient  cc:  ------------------------------------------------------------------- LV EF: 65% -   70%  ------------------------------------------------------------------- Indications:      429.3 Cardiomegaly.  ------------------------------------------------------------------- History:   PMH:   Chronic obstructive pulmonary disease.  Risk factors:  Current tobacco use. Hypertension.  ------------------------------------------------------------------- Study Conclusions  - Left ventricle: The cavity size was normal. Wall thickness was   normal. Systolic function was vigorous. The estimated ejection   fraction was in the range of 65% to 70%. There was a LV mid   cavity gradient, peak 44 mmHg. Wall motion was normal; there were   no regional wall motion abnormalities. Doppler parameters are   consistent with abnormal left ventricular relaxation (grade 1   diastolic dysfunction). - Aortic valve: There was no stenosis. - Mitral valve: Mildly to moderately calcified annulus. Normal   thickness leaflets . There was no significant regurgitation. - Right ventricle: The cavity size was normal. Systolic function   was normal. - Pulmonary arteries: No complete TR doppler jet so unable to   estimate PA systolic pressure. - Systemic veins: IVC not visualized.  Impressions:  - Normal LV size with mild LV hypertrophy, EF 65-70%. There was a   LV mid-cavity gradient to peak 44 mmHg. Normal RV size and   systolic function. No significant valvular abnormalities. Study   terminated early due to agitation.  Transthoracic echocardiography.  M-mode, complete 2D, spectral Doppler, and color  Doppler.  Birthdate:  Patient birthdate: 1940-06-20.  Age:  Patient is 73 yr old.  Sex:  Gender: female. BMI: 16.9 kg/m^2.  Blood pressure:     156/80  Patient status: Inpatient.  Study date:  Study date: 12/07/2013. Study time: 10:57 AM.  Location:  Bedside.  -------------------------------------------------------------------  ------------------------------------------------------------------- Left ventricle:  The cavity size was normal. Wall thickness was normal. Systolic function was vigorous. The estimated ejection fraction was in the range of 65% to 70%. There was a LV mid cavity gradient, peak 44 mmHg. Wall motion was normal; there were no regional wall motion abnormalities. Doppler parameters are consistent with abnormal left ventricular relaxation (grade 1 diastolic dysfunction).  ------------------------------------------------------------------- Aortic valve:   Trileaflet; mildly calcified leaflets.  Doppler: There was no stenosis.   There was no regurgitation.  ------------------------------------------------------------------- Aorta:  Aortic root: The aortic root was normal in size. Ascending aorta: The ascending aorta was normal in size.  ------------------------------------------------------------------- Mitral valve:  Mildly to moderately calcified annulus. Normal thickness leaflets .  Doppler:   There was no evidence for stenosis.   There was no significant regurgitation.  ------------------------------------------------------------------- Left atrium:  The atrium was normal in size.  ------------------------------------------------------------------- Right ventricle:  The cavity size was normal. Systolic function was normal.  ------------------------------------------------------------------- Pulmonic valve:    Structurally normal valve.   Cusp separation was normal.  Doppler:  Transvalvular velocity was within the normal range. There was no  regurgitation.  ------------------------------------------------------------------- Tricuspid valve:  Not visualized.  ------------------------------------------------------------------- Pulmonary artery:   No complete TR doppler jet so unable to estimate PA systolic pressure.  ------------------------------------------------------------------- Right atrium:  The atrium was normal in size.  ------------------------------------------------------------------- Pericardium:  There was no pericardial effusion.  ------------------------------------------------------------------- Systemic veins:  IVC not visualized.  ------------------------------------------------------------------- Measurements   Left ventricle                         Value        Reference  LV ID, ED, PLAX chordal        (L)     37    mm     43 - 52  LV ID, ES, PLAX chordal                26    mm     23 - 38  LV fx shortening, PLAX chordal         30    %      >=29  LV PW thickness, ED                    12    mm     ---------  IVS/LV PW ratio, ED                    1.08         <=1.3    Ventricular septum                     Value        Reference  IVS thickness, ED                      13    mm     ---------    Aorta                                  Value        Reference  Aortic root ID                         30    mm     ---------    Left atrium                            Value        Reference  LA ID, A-P, ES                         28    mm     ---------  LA ID/bsa, A-P                         1.86  cm/m^2 <=2.2    Systemic  veins                         Value        Reference  Estimated CVP                          0     mm Hg  --------- LEFT HIP - COMPLETE 2+ VIEW   COMPARISON:  None.   FINDINGS: The bones are demineralized. There is a subcapital fracture of the left femur which appears is mildly displaced. There is no evidence of dislocation or pelvic fracture. The right femoral neck  appears intact.   IMPRESSION: Mildly displaced subcapital fracture of the left femoral neck.   CT CHEST WITH CONTRAST   TECHNIQUE: Multidetector CT imaging of the chest was performed during intravenous contrast administration.   CONTRAST:  80mL OMNIPAQUE IOHEXOL 300 MG/ML  SOLN   COMPARISON:  Chest radiograph performed earlier today at 7:31 p.m., and CTA of the chest performed 08/17/2012   FINDINGS: Minimal bibasilar atelectasis is noted. The lungs are otherwise clear. No suspicious masses are seen. There is no evidence of pleural effusion or pneumothorax.   Prominence of the right hilum reflects normal vasculature. There is no evidence of mediastinal lymphadenopathy. Scattered calcification is noted at the aortic and mitral valves. The great vessels are grossly unremarkable in appearance, aside from mild calcific atherosclerotic disease. No pericardial effusion is identified.   The thyroid gland is unremarkable in appearance. No axillary lymphadenopathy is seen   Minimal hypodensities within the periphery of the liver may reflect tiny cysts, but are nonspecific in appearance. The spleen is unremarkable in appearance.   No acute osseous abnormalities are identified. Multiple mild chronic compression deformities are noted throughout the thoracic spine, most prominent at T6 and T10. These are stable from 2014. The patient is status post vertebroplasty at T6.   IMPRESSION: 1. Apparent prominence of the right hilum reflects normal vasculature. No mass is seen. 2. Minimal bibasilar atelectasis noted; lungs otherwise clear. 3. Scattered calcification at the aortic and mitral valves. 4. Question of tiny hepatic cysts. 5. Multiple mild compression deformities along the thoracic spine, relatively stable in appearance from 2014.     3-DIMENSIONAL CT IMAGE RENDERING ON ACQUISITION WORKSTATION   TECHNIQUE: 3-dimensional CT images were rendered by post-processing of  the original CT data on an acquisition workstation. The 3-dimensional CT images were interpreted and findings were reported in the accompanying complete CT report for this study   COMPARISON:  CT today.   FINDINGS: Subcapital LEFT femoral neck fracture is again noted.   IMPRESSION: Subcapital LEFT femoral neck fracture CT OF THE LEFT HIP WITHOUT CONTRAST   TECHNIQUE: Multidetector CT imaging of the left hip was performed according to the standard protocol. Multiplanar CT image reconstructions were also generated.   COMPARISON:  12/06/2013.   FINDINGS: Subcapital LEFT femoral neck fracture. Mild impaction. Osteopenia is present with poor mineralization of the femur and visualized pelvic bones. Minimal LEFT hip osteoarthritis. No acetabular fracture is present. Hemarthrosis. Incidental visualization the anatomic pelvis shows colonic diverticulosis and atherosclerosis.   There is no intertrochanteric extension of the fracture. No destructive osseous lesions.   IMPRESSION: Nondisplaced mildly impacted subcapital LEFT femoral neck fracture.     PORTABLE PELVIS 1-2 VIEWS   COMPARISON:  CT scan of the pelvis dated 07 December 2013 prior to hip joint replacement.   FINDINGS: There is a prosthetic left  hip joint in good position. The native bone of the proximal femur is normal in appearance. There is diffuse osteopenia of the visualized portions of the pelvis. There are skin staples present.   IMPRESSION: There is no immediate postprocedure complication following left hip joint replacement.   PORTABLE LEFT HIP - 1 VIEW   COMPARISON:  AP pelvis of today's date   FINDINGS: The left hip joint prosthesis is in good position radiographically. The interface with the native bone of the proximal femur is good. There is skin staples present.   IMPRESSION: There is no immediate postprocedure complication following left hip joint replacement.   PORTABLE CHEST - 1 VIEW    COMPARISON:  Portable chest x-ray of December 08, 2013   FINDINGS: The lungs are well-expanded. The interstitial markings remain coarse. There is no alveolar infiltrate. The left hemidiaphragm is slightly less well demonstrated today. The a trace of pleural fluid is suspected. The cardiac silhouette and pulmonary vascularity are normal. The bony thorax is unremarkable. Patient has undergone previous kyphoplasty of approximately T6   IMPRESSION: Slightly increased density at the left lung base with small left pleural effusion has developed since the earlier study. When the patient can tolerate the procedure, a PA and lateral chest x-ray would be useful.   ASSESSMENT/PLAN:  Left femoral neck fracture-status post left hip hemiarthroplasty. Continue rehabilitation. Pneumonia-continue Augmentin COPD-compensated Hypertension-well-controlled Constipation-continue current laxatives Acute blood loss anemia-check hemoglobin Check CBC with differential and BMP  I have reviewed patient's medical records received at admission/from hospitalization.  CPT CODE: 21308  Angela Cox, MD [facility] [phone]

## 2013-12-16 LAB — CULTURE, BLOOD (ROUTINE X 2)
CULTURE: NO GROWTH
Culture: NO GROWTH

## 2013-12-26 ENCOUNTER — Non-Acute Institutional Stay (SKILLED_NURSING_FACILITY): Payer: Medicare Other | Admitting: Adult Health

## 2013-12-26 DIAGNOSIS — K5900 Constipation, unspecified: Secondary | ICD-10-CM

## 2013-12-26 DIAGNOSIS — I10 Essential (primary) hypertension: Secondary | ICD-10-CM

## 2013-12-26 DIAGNOSIS — S72002S Fracture of unspecified part of neck of left femur, sequela: Secondary | ICD-10-CM

## 2013-12-26 DIAGNOSIS — J439 Emphysema, unspecified: Secondary | ICD-10-CM

## 2013-12-26 DIAGNOSIS — E871 Hypo-osmolality and hyponatremia: Secondary | ICD-10-CM

## 2013-12-29 ENCOUNTER — Encounter: Payer: Self-pay | Admitting: Adult Health

## 2013-12-29 NOTE — Progress Notes (Signed)
Patient ID: [name], female   DOB: 12-17-1940, 74 y.o.   MRN: [id number]              PROGRESS NOTE  DATE: 12/26/2013   FACILITY: [facility]  LEVEL OF CARE: SNF (31)  Acute Visit  Discharge Notes  HISTORY OF PRESENT ILLNESS:   This is a 73 year old female who is for discharge home with Home health PT. DME: Wheelchair with cushion, leg rests and anti-tippers. She has been admitted to [facility] on 12/12/13 from [facility]. She had a fall at home sustaining a left hip fracture status post left hip hemiarthroplasty.  Patient was admitted to this facility for short-term rehabilitation after the patient's recent hospitalization.  Patient has completed SNF rehabilitation and therapy has cleared the patient for discharge.  Reassessment of ongoing problem(s):  HTN: Pt 's HTN remains stable.  Denies CP, sob, DOE, pedal edema, headaches, dizziness or visual disturbances.  No complications from the medications currently being used.  Last BP : 106/72  COPD: the COPD remains stable.  Pt denies sob, cough, wheezing or declining exercise tolerance.  No complications from the medications presently being used.  CONSTIPATION: The constipation remains stable. No complications from the medications presently being used. Patient denies ongoing constipation, abdominal pain, nausea or vomiting.   Past Medical History  Diagnosis Date  . Back pain   . Hypertension   . COPD (chronic obstructive pulmonary disease)   . Anxiety   . Hyperlipemia   . Compression fracture   . Hypercalcemia      Reviewed.  No changes/see problem list  CURRENT MEDICATIONS: Reviewed per MAR/see medication list   No Known Allergies   REVIEW OF SYSTEMS:  GENERAL: no change in appetite, no fatigue, no weight changes, no fever, chills or weakness RESPIRATORY: no cough, SOB, DOE, wheezing, hemoptysis CARDIAC: no chest pain, edema or palpitations GI: no abdominal pain, diarrhea, constipation,  heart burn, nausea or vomiting  PHYSICAL EXAMINATION  GENERAL: no acute distress, normal body habitus EYES: conjunctivae normal, sclerae normal, normal eye lids NECK: supple, trachea midline, no neck masses, no thyroid tenderness, no thyromegaly LYMPHATICS: no LAN in the neck, no supraclavicular LAN RESPIRATORY: breathing is even & unlabored, BS CTAB CARDIAC: RRR, no murmur,no extra heart sounds, no edema GI: abdomen soft, normal BS, no masses, no tenderness, no hepatomegaly, no splenomegaly EXTREMITIES: able to move all 4 extremities PSYCHIATRIC: the patient is alert & oriented to person, affect & behavior appropriate  LABS/RADIOLOGY: 12/15/13  WBC 10.1 hemoglobin 12.0 hematocrit 37.1 MCV 90.0 sodium 129 potassium 4.3 glucose 98 BUN 7 creatinine 0.3 calcium 8.5 12/14/13  WBC 12.7 hemoglobin 14.0 hematocrit 43.5 MCV 90.4 sodium 129 potassium 3.9 glucose 134 BUN 8 creatinine 0.4 calcium 9.1 Labs reviewed: Basic Metabolic Panel:  Recent Labs  40/98/11 0430 12/11/13 0600 12/12/13 0442  NA 135* 126* 129*  K 3.9 4.0 3.8  CL 99 92* 91*  CO2 25 25 28   GLUCOSE 102* 103* 105*  BUN 16 7 6   CREATININE 0.56 0.43* 0.47*  CALCIUM 8.7 8.1* 8.2*  MG 1.9  --   --    Liver Function Tests:  Recent Labs  12/08/13 0453  AST 17  ALT 20  ALKPHOS 88  BILITOT 0.7  PROT 6.5  ALBUMIN 3.0*   CBC:  Recent Labs  12/06/13 1842  12/10/13 0517 12/11/13 0600 12/12/13 0442  WBC 15.9*  < > 13.4* 13.8* 13.8*  NEUTROABS 12.7*  --   --   --   --  HGB 14.8  < > 12.7 12.2 12.2  HCT 44.3  < > 38.5 36.7 36.8  MCV 89.3  < > 90.4 88.6 88.9  PLT 443*  < > 400 378 406*  < > = values in this interval not displayed.  Dg Hip Complete Left  12/06/2013   CLINICAL DATA:  Left hip pain after falling today.  EXAM: LEFT HIP - COMPLETE 2+ VIEW  COMPARISON:  None.  FINDINGS: The bones are demineralized. There is a subcapital fracture of the left femur which appears is mildly displaced. There is no evidence of  dislocation or pelvic fracture. The right femoral neck appears intact.  IMPRESSION: Mildly displaced subcapital fracture of the left femoral neck.   Electronically Signed   By: Roxy Horseman M.D.   On: 12/06/2013 18:07   Ct Chest W Contrast  12/07/2013   CLINICAL DATA:  Right hilar prominence noted on chest radiograph.  EXAM: CT CHEST WITH CONTRAST  TECHNIQUE: Multidetector CT imaging of the chest was performed during intravenous contrast administration.  CONTRAST:  80mL OMNIPAQUE IOHEXOL 300 MG/ML  SOLN  COMPARISON:  Chest radiograph performed earlier today at 7:31 p.m., and CTA of the chest performed 08/17/2012  FINDINGS: Minimal bibasilar atelectasis is noted. The lungs are otherwise clear. No suspicious masses are seen. There is no evidence of pleural effusion or pneumothorax.  Prominence of the right hilum reflects normal vasculature. There is no evidence of mediastinal lymphadenopathy. Scattered calcification is noted at the aortic and mitral valves. The great vessels are grossly unremarkable in appearance, aside from mild calcific atherosclerotic disease. No pericardial effusion is identified.  The thyroid gland is unremarkable in appearance. No axillary lymphadenopathy is seen  Minimal hypodensities within the periphery of the liver may reflect tiny cysts, but are nonspecific in appearance. The spleen is unremarkable in appearance.  No acute osseous abnormalities are identified. Multiple mild chronic compression deformities are noted throughout the thoracic spine, most prominent at T6 and T10. These are stable from 2014. The patient is status post vertebroplasty at T6.  IMPRESSION: 1. Apparent prominence of the right hilum reflects normal vasculature. No mass is seen. 2. Minimal bibasilar atelectasis noted; lungs otherwise clear. 3. Scattered calcification at the aortic and mitral valves. 4. Question of tiny hepatic cysts. 5. Multiple mild compression deformities along the thoracic spine, relatively stable  in appearance from 2014.   Electronically Signed   By: Roanna Raider M.D.   On: 12/07/2013 00:36   Dg Pelvis Portable  12/08/2013   CLINICAL DATA:  Postoperative from left hip joint replacement  EXAM: PORTABLE PELVIS 1-2 VIEWS  COMPARISON:  CT scan of the pelvis dated 07 December 2013 prior to hip joint replacement.  FINDINGS: There is a prosthetic left hip joint in good position. The native bone of the proximal femur is normal in appearance. There is diffuse osteopenia of the visualized portions of the pelvis. There are skin staples present.  IMPRESSION: There is no immediate postprocedure complication following left hip joint replacement.   Electronically Signed   By: David  [place]   On: 12/08/2013 13:16   Ct Hip Left Wo Contrast  12/07/2013   CLINICAL DATA:  Fall 12/06/2013.  Subcapital femoral neck fracture.  EXAM: CT OF THE LEFT HIP WITHOUT CONTRAST  TECHNIQUE: Multidetector CT imaging of the left hip was performed according to the standard protocol. Multiplanar CT image reconstructions were also generated.  COMPARISON:  12/06/2013.  FINDINGS: Subcapital LEFT femoral neck fracture. Mild impaction. Osteopenia is present with  poor mineralization of the femur and visualized pelvic bones. Minimal LEFT hip osteoarthritis. No acetabular fracture is present. Hemarthrosis. Incidental visualization the anatomic pelvis shows colonic diverticulosis and atherosclerosis.  There is no intertrochanteric extension of the fracture. No destructive osseous lesions.  IMPRESSION: Nondisplaced mildly impacted subcapital LEFT femoral neck fracture.   Electronically Signed   By: Andreas NewportGeoffrey  Lamke M.D.   On: 12/07/2013 09:56   Ct 3d Recon At Scanner  12/07/2013   CLINICAL DATA:  Nonspecific (abnormal) findings on radiological and other examination of musculoskeletal system. Subcapital LEFT femoral neck fracture  EXAM: 3-DIMENSIONAL CT IMAGE RENDERING ON ACQUISITION WORKSTATION  TECHNIQUE: 3-dimensional CT images were rendered  by post-processing of the original CT data on an acquisition workstation. The 3-dimensional CT images were interpreted and findings were reported in the accompanying complete CT report for this study  COMPARISON:  CT today.  FINDINGS: Subcapital LEFT femoral neck fracture is again noted.  IMPRESSION: Subcapital LEFT femoral neck fracture.   Electronically Signed   By: Andreas NewportGeoffrey  Lamke M.D.   On: 12/07/2013 09:57   Dg Chest Port 1 View  12/10/2013   CLINICAL DATA:  Shortness of breath  EXAM: PORTABLE CHEST - 1 VIEW  COMPARISON:  Portable chest x-ray of December 08, 2013  FINDINGS: The lungs are well-expanded. The interstitial markings remain coarse. There is no alveolar infiltrate. The left hemidiaphragm is slightly less well demonstrated today. The a trace of pleural fluid is suspected. The cardiac silhouette and pulmonary vascularity are normal. The bony thorax is unremarkable. Patient has undergone previous kyphoplasty of approximately T6  IMPRESSION: Slightly increased density at the left lung base with small left pleural effusion has developed since the earlier study. When the patient can tolerate the procedure, a PA and lateral chest x-ray would be useful.   Electronically Signed   By: David  [place]   On: 12/10/2013 09:30   Dg Chest Port 1 View  12/08/2013   CLINICAL DATA:  Bloody sputum ; status post left hip joint replacement  EXAM: PORTABLE CHEST - 1 VIEW  COMPARISON:  Portable chest x-ray of December 06, 2013  FINDINGS: The lungs are well-expanded and exhibit no alveolar infiltrate nor parenchymal mass. The interstitial markings are coarse but stable. The heart and pulmonary vascularity are within the limits of normal. There is mild stable tortuosity of the descending thoracic aorta. The patient has undergone previous kyphoplasty at T6.  IMPRESSION: COPD.  There is no acute cardiopulmonary abnormality.   Electronically Signed   By: David  [place]   On: 12/08/2013 13:20   Dg Chest Port 1  View  12/06/2013   CLINICAL DATA:  Fall.  Smoker.  Cough.  EXAM: PORTABLE CHEST - 1 VIEW  COMPARISON:  01/19/2013  FINDINGS: Heart is mildly enlarged. There is prominence of interstitial markings. Perihilar peribronchial thickening is present. There are no focal consolidations. No pleural effusion. No pneumothorax. There is question of a mass in the right hilar region. Further evaluation with chest CT is recommended. Intravenous contrast is recommended unless contraindicated. No acute, displaced fracture.  IMPRESSION: 1. Cardiomegaly without pulmonary edema. 2. Bronchitic changes. 3. Question of right hilar mass or adenopathy. Further evaluation with CT of the chest is recommended.   Electronically Signed   By: Rosalie GumsBeth  Brown M.D.   On: 12/06/2013 20:00   Dg Hip Portable 1 View Left  12/08/2013   CLINICAL DATA:  Status post left hip joint replacement.  EXAM: PORTABLE LEFT HIP - 1 VIEW  COMPARISON:  AP  pelvis of today's date  FINDINGS: The left hip joint prosthesis is in good position radiographically. The interface with the native bone of the proximal femur is good. There is skin staples present.  IMPRESSION: There is no immediate postprocedure complication following left hip joint replacement.   Electronically Signed   By: David  [place]   On: 12/08/2013 13:16     ASSESSMENT/PLAN:  Left hip fracture status post left hip hemiarthroplasty - for home health PT COPD - stable; continue his Spiriva Hypertension - well controlled; continue metoprolol Chronic hyponatremia - stable; Latest Na 129 Constipation - stable; continue Colace   I have filled out patient's discharge paperwork and written prescriptions.  Patient will receive home health PT.  DME provided: Wheelchair with cushion, leg rests and anti-tippers  Total discharge time: Greater than 30 minutes  Discharge time involved coordination of the discharge process with Child psychotherapist, nursing staff and therapy department. Medical justification for  home health services/DME verified.  CPT CODE: 70623  [facility], NP [facility] [phone]

## 2015-05-16 ENCOUNTER — Emergency Department (HOSPITAL_COMMUNITY)
Admission: EM | Admit: 2015-05-16 | Discharge: 2015-05-16 | Disposition: A | Discharge status: Home / Self Care | Payer: Medicare Other | Source: Home / Self Care | Attending: Emergency Medicine | Admitting: Emergency Medicine

## 2015-05-16 ENCOUNTER — Encounter (HOSPITAL_COMMUNITY): Payer: Self-pay

## 2015-05-16 ENCOUNTER — Emergency Department (HOSPITAL_COMMUNITY): Payer: Medicare Other

## 2015-05-16 ENCOUNTER — Emergency Department (HOSPITAL_COMMUNITY)
Admission: EM | Admit: 2015-05-16 | Discharge: 2015-05-16 | Disposition: A | Discharge status: Home / Self Care | Payer: Medicare Other | Attending: Emergency Medicine | Admitting: Emergency Medicine

## 2015-05-16 ENCOUNTER — Encounter (HOSPITAL_COMMUNITY): Payer: Self-pay | Admitting: Emergency Medicine

## 2015-05-16 DIAGNOSIS — Z8639 Personal history of other endocrine, nutritional and metabolic disease: Secondary | ICD-10-CM | POA: Insufficient documentation

## 2015-05-16 DIAGNOSIS — J449 Chronic obstructive pulmonary disease, unspecified: Secondary | ICD-10-CM | POA: Insufficient documentation

## 2015-05-16 DIAGNOSIS — Y998 Other external cause status: Secondary | ICD-10-CM | POA: Insufficient documentation

## 2015-05-16 DIAGNOSIS — Z8659 Personal history of other mental and behavioral disorders: Secondary | ICD-10-CM | POA: Insufficient documentation

## 2015-05-16 DIAGNOSIS — Z8781 Personal history of (healed) traumatic fracture: Secondary | ICD-10-CM | POA: Insufficient documentation

## 2015-05-16 DIAGNOSIS — I10 Essential (primary) hypertension: Secondary | ICD-10-CM | POA: Insufficient documentation

## 2015-05-16 DIAGNOSIS — W1839XA Other fall on same level, initial encounter: Secondary | ICD-10-CM | POA: Insufficient documentation

## 2015-05-16 DIAGNOSIS — Y9289 Other specified places as the place of occurrence of the external cause: Secondary | ICD-10-CM | POA: Insufficient documentation

## 2015-05-16 DIAGNOSIS — S7002XA Contusion of left hip, initial encounter: Secondary | ICD-10-CM | POA: Insufficient documentation

## 2015-05-16 DIAGNOSIS — Y9389 Activity, other specified: Secondary | ICD-10-CM | POA: Insufficient documentation

## 2015-05-16 DIAGNOSIS — F17210 Nicotine dependence, cigarettes, uncomplicated: Secondary | ICD-10-CM | POA: Insufficient documentation

## 2015-05-16 DIAGNOSIS — Y92009 Unspecified place in unspecified non-institutional (private) residence as the place of occurrence of the external cause: Secondary | ICD-10-CM | POA: Insufficient documentation

## 2015-05-16 LAB — CBC WITH DIFFERENTIAL/PLATELET
Basophils Absolute: 0 10*3/uL (ref 0.0–0.1)
Basophils Relative: 0 %
Eosinophils Absolute: 0.1 10*3/uL (ref 0.0–0.7)
Eosinophils Relative: 1 %
HEMATOCRIT: 47.7 % — AB (ref 36.0–46.0)
HEMOGLOBIN: 15.8 g/dL — AB (ref 12.0–15.0)
Lymphocytes Relative: 16 %
Lymphs Abs: 1.3 10*3/uL (ref 0.7–4.0)
MCH: 28.9 pg (ref 26.0–34.0)
MCHC: 33.1 g/dL (ref 30.0–36.0)
MCV: 87.2 fL (ref 78.0–100.0)
MONO ABS: 0.8 10*3/uL (ref 0.1–1.0)
Monocytes Relative: 10 %
Neutro Abs: 5.9 10*3/uL (ref 1.7–7.7)
Neutrophils Relative %: 73 %
Platelets: 404 10*3/uL — ABNORMAL HIGH (ref 150–400)
RBC: 5.47 MIL/uL — ABNORMAL HIGH (ref 3.87–5.11)
RDW: 13.4 % (ref 11.5–15.5)
WBC: 8.1 10*3/uL (ref 4.0–10.5)

## 2015-05-16 LAB — BASIC METABOLIC PANEL
Anion gap: 10 (ref 5–15)
BUN: 10 mg/dL (ref 6–20)
CALCIUM: 9.5 mg/dL (ref 8.9–10.3)
CHLORIDE: 95 mmol/L — AB (ref 101–111)
CO2: 27 mmol/L (ref 22–32)
CREATININE: 0.62 mg/dL (ref 0.44–1.00)
GFR calc non Af Amer: >60 mL/min (ref >60)
GLUCOSE: 73 mg/dL (ref 65–99)
Potassium: 4.2 mmol/L (ref 3.5–5.1)
Sodium: 132 mmol/L — ABNORMAL LOW (ref 135–145)

## 2015-05-16 MED ORDER — METOPROLOL TARTRATE 25 MG PO TABS
25.0000 mg | ORAL_TABLET | Freq: Once | ORAL | Status: AC
  Administered 2015-05-16: 25 mg via ORAL
  Filled 2015-05-16: qty 1

## 2015-05-16 MED ORDER — METOPROLOL SUCCINATE ER 25 MG PO TB24: 25.0000 mg | ORAL_TABLET | Freq: Two times a day (BID) | ORAL | Status: AC

## 2015-05-16 MED ORDER — ACETAMINOPHEN 500 MG PO TABS
1000.0000 mg | ORAL_TABLET | Freq: Four times a day (QID) | ORAL | Status: AC | PRN
Start: 2015-05-16 — End: ?

## 2015-05-16 NOTE — ED Notes (Signed)
She was found to be hypertensive on her trip back home (just d/c'd. From our E.D.); so [facility] phoned EMS per family request.  She arrives here in no distress.  She was seen here earlier today for a fall.  She had c/o left hip pain, the x-ray for which was negative.

## 2015-05-16 NOTE — ED Notes (Signed)
[facility] has been called  

## 2015-05-16 NOTE — Discharge Instructions (Signed)

## 2015-05-16 NOTE — Discharge Instructions (Signed)
Metoprolol as prescribed.  Keep a record of your blood pressures for the next several days, and follow this up with your primary Dr. at your next appointment.   Hypertension Hypertension, commonly called high blood pressure, is when the force of blood pumping through your arteries is too strong. Your arteries are the blood vessels that carry blood from your heart throughout your body. A blood pressure reading consists of a higher number over a lower number, such as 110/72. The higher number (systolic) is the pressure inside your arteries when your heart pumps. The lower number (diastolic) is the pressure inside your arteries when your heart relaxes. Ideally you want your blood pressure below 120/80. Hypertension forces your heart to work harder to pump blood. Your arteries may become narrow or stiff. Having untreated or uncontrolled hypertension can cause heart attack, stroke, kidney disease, and other problems. RISK FACTORS Some risk factors for high blood pressure are controllable. Others are not.  Risk factors you cannot control include:   Race. You may be at higher risk if you are African American.  Age. Risk increases with age.  Gender. Men are at higher risk than women before age 32 years. After age 24, women are at higher risk than men. Risk factors you can control include:  Not getting enough exercise or physical activity.  Being overweight.  Getting too much fat, sugar, calories, or salt in your diet.  Drinking too much alcohol. SIGNS AND SYMPTOMS Hypertension does not usually cause signs or symptoms. Extremely high blood pressure (hypertensive crisis) may cause headache, anxiety, shortness of breath, and nosebleed. DIAGNOSIS To check if you have hypertension, your health care provider will measure your blood pressure while you are seated, with your arm held at the level of your heart. It should be measured at least twice using the same arm. Certain conditions can cause a  difference in blood pressure between your right and left arms. A blood pressure reading that is higher than normal on one occasion does not mean that you need treatment. If it is not clear whether you have high blood pressure, you may be asked to return on a different day to have your blood pressure checked again. Or, you may be asked to monitor your blood pressure at home for 1 or more weeks. TREATMENT Treating high blood pressure includes making lifestyle changes and possibly taking medicine. Living a healthy lifestyle can help lower high blood pressure. You may need to change some of your habits. Lifestyle changes may include:  Following the DASH diet. This diet is high in fruits, vegetables, and whole grains. It is low in salt, red meat, and added sugars.  Keep your sodium intake below 2,300 mg per day.  Getting at least 30-45 minutes of aerobic exercise at least 4 times per week.  Losing weight if necessary.  Not smoking.  Limiting alcoholic beverages.  Learning ways to reduce stress. Your health care provider may prescribe medicine if lifestyle changes are not enough to get your blood pressure under control, and if one of the following is true:  You are 34-25 years of age and your systolic blood pressure is above 140.  You are 78 years of age or older, and your systolic blood pressure is above 150.  Your diastolic blood pressure is above 90.  You have diabetes, and your systolic blood pressure is over 140 or your diastolic blood pressure is over 90.  You have kidney disease and your blood pressure is above 140/90.  You  have heart disease and your blood pressure is above 140/90. Your personal target blood pressure may vary depending on your medical conditions, your age, and other factors. HOME CARE INSTRUCTIONS  Have your blood pressure rechecked as directed by your health care provider.   Take medicines only as directed by your health care provider. Follow the directions  carefully. Blood pressure medicines must be taken as prescribed. The medicine does not work as well when you skip doses. Skipping doses also puts you at risk for problems.  Do not smoke.   Monitor your blood pressure at home as directed by your health care provider. SEEK MEDICAL CARE IF:   You think you are having a reaction to medicines taken.  You have recurrent headaches or feel dizzy.  You have swelling in your ankles.  You have trouble with your vision. SEEK IMMEDIATE MEDICAL CARE IF:  You develop a severe headache or confusion.  You have unusual weakness, numbness, or feel faint.  You have severe chest or abdominal pain.  You vomit repeatedly.  You have trouble breathing. MAKE SURE YOU:   Understand these instructions.  Will watch your condition.  Will get help right away if you are not doing well or get worse.   This information is not intended to replace advice given to you by your health care provider. Make sure you discuss any questions you have with your health care provider.   Document Released: 03/10/2005 Document Revised: 07/25/2014 Document Reviewed: 12/31/2012 Elsevier Interactive Patient Education [employer]

## 2015-05-16 NOTE — ED Notes (Signed)
Pt in radiology, will draw blood upon return

## 2015-05-16 NOTE — ED Notes (Signed)
She is awake, alert and is oriented to all except date/time.  She is visiting with a family member and a friend, whom she calls by their correct names.  Her speech is clear and she is h.o.h., therefore she speaks a bit loudly.

## 2015-05-16 NOTE — ED Notes (Signed)
Pt d/c. Per [facility], pt d/c bp 171/100, notified dr. Judd Lien of the same, okay for d/c no new orders

## 2015-05-16 NOTE — ED Notes (Signed)
Per [facility] pt from home, reports fall yesterday , left hip pain. denies head injury nor loc. Per home care aid pt slid out of bed, Pt has refused transport yesterday for similar symptoms. No obvious deformity nor shortening of leg per EMS. Pt alert and oriented x 4.

## 2015-05-16 NOTE — ED Notes (Signed)
Bed: [facility] Expected date:  Expected time:  Means of arrival:  Comments: 11M/fall/hip pain

## 2015-05-16 NOTE — ED Provider Notes (Signed)
CSN: [id number]     Arrival date & time 05/16/15  1032 History   First MD Initiated Contact with Patient 05/16/15 1044     Chief Complaint  Patient presents with  . Hip Injury    left   . Fall     (Consider location/radiation/quality/duration/timing/severity/associated sxs/prior Treatment) Patient is a 75 y.o. female presenting with fall. The history is provided by the patient.  Fall This is a new problem. The current episode started yesterday. The problem occurs constantly. The problem has not changed since onset.Pertinent negatives include no chest pain and no abdominal pain. Associated symptoms comments: Left hip pain laterally. Nothing aggravates the symptoms. Nothing relieves the symptoms. She has tried nothing for the symptoms.    Past Medical History  Diagnosis Date  . Back pain   . Hypertension   . COPD (chronic obstructive pulmonary disease) (HCC)   . Anxiety   . Hyperlipemia   . Compression fracture   . Hypercalcemia    Past Surgical History  Procedure Laterality Date  . Back surgery    . Dental surgery    . Hip arthroplasty Left 12/08/2013    Procedure: LEFT HIP HEMI ARTHROPLASTY ;  Surgeon: Javier Docker, MD;  Location: [facility];  Service: Orthopedics;  Laterality: Left;   No family history on file. Social History  Substance Use Topics  . Smoking status: Current Some Day Smoker -- 0.25 packs/day for 20 years    Types: Cigarettes  . Smokeless tobacco: Never Used  . Alcohol Use: 1.2 oz/week    2 Glasses of wine per week     Comment: wine socially   OB History    No data available     Review of Systems  Cardiovascular: Negative for chest pain.  Gastrointestinal: Negative for abdominal pain.  All other systems reviewed and are negative.     Allergies  Review of patient's allergies indicates no known allergies.  Home Medications   Prior to Admission medications   Medication Sig Start Date End Date Taking? Authorizing Provider  acetaminophen  (TYLENOL) 500 MG tablet Take 2 tablets (1,000 mg total) by mouth every 6 (six) hours as needed for moderate pain. 05/16/15   Lyndal Pulley, MD  bisacodyl (DULCOLAX) 5 MG EC tablet Take 1 tablet (5 mg total) by mouth daily as needed for moderate constipation. Patient not taking: Reported on 05/16/2015 12/12/13   Catarina Hartshorn, MD  docusate sodium (COLACE) 100 MG capsule Take 1 capsule (100 mg total) by mouth 2 (two) times daily. Patient not taking: Reported on 05/16/2015 12/08/13   Dorothy Spark, PA-C  enoxaparin (LOVENOX) 40 MG/0.4ML injection Inject 0.4 mLs (40 mg total) into the skin daily. Patient not taking: Reported on 05/16/2015 12/12/13   Catarina Hartshorn, MD  folic acid (FOLVITE) 1 MG tablet Take 1 tablet (1 mg total) by mouth daily. Patient not taking: Reported on 05/16/2015 12/12/13   Catarina Hartshorn, MD  metoprolol tartrate (LOPRESSOR) 25 MG tablet Take 1 tablet (25 mg total) by mouth 2 (two) times daily. Patient not taking: Reported on 05/16/2015 12/12/13   Catarina Hartshorn, MD  tiotropium (SPIRIVA HANDIHALER) 18 MCG inhalation capsule Place 1 capsule (18 mcg total) into inhaler and inhale daily. Patient not taking: Reported on 05/16/2015 12/12/13   Catarina Hartshorn, MD   BP 162/91 mmHg  Pulse 73  Temp(Src) 97.6 F (36.4 C) (Oral)  SpO2 95% Physical Exam  Constitutional: She is oriented to person, place, and time. She appears well-developed and well-nourished. No  distress.  HENT:  Head: Normocephalic.  Eyes: Conjunctivae are normal.  Neck: Neck supple. No tracheal deviation present.  Cardiovascular: Normal rate and regular rhythm.   Pulmonary/Chest: Effort normal. No respiratory distress.  Abdominal: Soft. She exhibits no distension.  Musculoskeletal:       Left hip: She exhibits tenderness (posterolaterally ver gluteous without overlying bruising or decreased ROM, able to bear weight).  Neurological: She is alert and oriented to person, place, and time.  Skin: Skin is warm and dry.  Psychiatric: She has a normal  mood and affect.    ED Course  Procedures (including critical care time) Labs Review Labs Reviewed  CBC WITH DIFFERENTIAL/PLATELET - Abnormal; Notable for the following:    RBC 5.47 (*)    Hemoglobin 15.8 (*)    HCT 47.7 (*)    Platelets 404 (*)    All other components within normal limits  BASIC METABOLIC PANEL - Abnormal; Notable for the following:    Sodium 132 (*)    Chloride 95 (*)    All other components within normal limits    Imaging Review Dg Chest 2 View  05/16/2015  CLINICAL DATA:  Larey Seat yesterday.  Hypertension and COPD. EXAM: CHEST  2 VIEW COMPARISON:  12/10/2013 FINDINGS: Chronic left ventricular prominence. Atherosclerosis of the aorta. The lungs are clear. No effusions. There is an old augmented fracture at T6. Partial compression fractures are seen above that an below that at T7 and T10. Old healed rib fractures on the left as seen previously. IMPRESSION: No acute finding. Left ventricular prominence. Atherosclerosis of the aorta. Old thoracic fractures and old rib fractures. Electronically Signed   By: Paulina Fusi M.D.   On: 05/16/2015 11:40   Dg Hip Unilat With Pelvis 2-3 Views Left  05/16/2015  CLINICAL DATA:  Left posterior hip pain after a fall. Slipped in the bathroom. 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 Other bones of the pelvis are unremarkable. IMPRESSION: No acute or traumatic finding. 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 Electronically Signed   By: Paulina Fusi M.D.   On: 05/16/2015 11:41   I have personally reviewed and evaluated these images and lab results as part of my medical decision-making.   EKG Interpretation None      MDM   Final diagnoses:  Contusion, hip, left, initial encounter   75 y.o. female presents with fall yesterday where she was helped to the floor by 24 care support team member and continued normal  activity for the rest of the day. Today she is having pin in the area. No external signs of injury and Pt able to ambulate a fe steps at baseline, usually uses a walker. Plain films negative for fracture or hardware complication. Plan to follow up with PCP as needed and return precautions discussed for worsening or new concerning symptoms.     Lyndal Pulley, MD 05/16/15 2217

## 2015-05-16 NOTE — ED Notes (Signed)
Bed: RESB Expected date:  Expected time:  Means of arrival:  Comments: EMS H/A htn

## 2015-05-16 NOTE — ED Provider Notes (Signed)
CSN: [id number]     Arrival date & time 05/16/15  1645 History   First MD Initiated Contact with Patient 05/16/15 1823     Chief Complaint  Patient presents with  . Hypertension     (Consider location/radiation/quality/duration/timing/severity/associated sxs/prior Treatment) HPI Comments: Patient is a 75 year old female with history of COPD, hyponatremia. She presents for evaluation of elevated blood pressure. The patient was seen here this afternoon and diagnosed with a hip contusion. She was discharged to home. Shortly after arriving home she was found to have an elevated blood pressure. Her caretakers were uncomfortable with this the patient was again brought to the ER. Patient denies to me she is experiencing any other symptoms.  Patient is a 75 y.o. female presenting with hypertension. The history is provided by the patient.  Hypertension This is a new problem. The current episode started less than 1 hour ago. The problem occurs constantly. The problem has not changed since onset.Pertinent negatives include no chest pain, no headaches and no shortness of breath. Nothing aggravates the symptoms. Nothing relieves the symptoms. She has tried nothing for the symptoms. The treatment provided no relief.    Past Medical History  Diagnosis Date  . Back pain   . Hypertension   . COPD (chronic obstructive pulmonary disease) (HCC)   . Anxiety   . Hyperlipemia   . Compression fracture   . Hypercalcemia    Past Surgical History  Procedure Laterality Date  . Back surgery    . Dental surgery    . Hip arthroplasty Left 12/08/2013    Procedure: LEFT HIP HEMI ARTHROPLASTY ;  Surgeon: Javier Docker, MD;  Location: [facility];  Service: Orthopedics;  Laterality: Left;   No family history on file. Social History  Substance Use Topics  . Smoking status: Current Some Day Smoker -- 0.25 packs/day for 20 years    Types: Cigarettes  . Smokeless tobacco: Never Used  . Alcohol Use: 1.2 oz/week    2  Glasses of wine per week     Comment: wine socially   OB History    No data available     Review of Systems  Respiratory: Negative for shortness of breath.   Cardiovascular: Negative for chest pain.  Neurological: Negative for headaches.  All other systems reviewed and are negative.     Allergies  Review of patient's allergies indicates no known allergies.  Home Medications   Prior to Admission medications   Medication Sig Start Date End Date Taking? Authorizing Provider  acetaminophen (TYLENOL) 500 MG tablet Take 2 tablets (1,000 mg total) by mouth every 6 (six) hours as needed for moderate pain. 05/16/15  Yes Lyndal Pulley, MD  bisacodyl (DULCOLAX) 5 MG EC tablet Take 1 tablet (5 mg total) by mouth daily as needed for moderate constipation. Patient not taking: Reported on 05/16/2015 12/12/13   Catarina Hartshorn, MD  docusate sodium (COLACE) 100 MG capsule Take 1 capsule (100 mg total) by mouth 2 (two) times daily. Patient not taking: Reported on 05/16/2015 12/08/13   Dorothy Spark, PA-C  enoxaparin (LOVENOX) 40 MG/0.4ML injection Inject 0.4 mLs (40 mg total) into the skin daily. Patient not taking: Reported on 05/16/2015 12/12/13   Catarina Hartshorn, MD  folic acid (FOLVITE) 1 MG tablet Take 1 tablet (1 mg total) by mouth daily. Patient not taking: Reported on 05/16/2015 12/12/13   Catarina Hartshorn, MD  metoprolol tartrate (LOPRESSOR) 25 MG tablet Take 1 tablet (25 mg total) by mouth 2 (two) times daily.  Patient not taking: Reported on 05/16/2015 12/12/13   Catarina Hartshorn, MD  tiotropium (SPIRIVA HANDIHALER) 18 MCG inhalation capsule Place 1 capsule (18 mcg total) into inhaler and inhale daily. Patient not taking: Reported on 05/16/2015 12/12/13   Catarina Hartshorn, MD   BP 183/89 mmHg  Pulse 85  Temp(Src) 98.4 F (36.9 C) (Oral)  Resp 18  SpO2 96% Physical Exam  Constitutional: She is oriented to person, place, and time. She appears well-developed and well-nourished. No distress.  HENT:  Head: Normocephalic and  atraumatic.  Eyes: EOM are normal. Pupils are equal, round, and reactive to light.  Neck: Normal range of motion. Neck supple.  Cardiovascular: Normal rate and regular rhythm.  Exam reveals no gallop and no friction rub.   No murmur heard. Pulmonary/Chest: Effort normal and breath sounds normal. No respiratory distress. She has no wheezes.  Abdominal: Soft. Bowel sounds are normal. She exhibits no distension. There is no tenderness.  Musculoskeletal: Normal range of motion.  Neurological: She is alert and oriented to person, place, and time. No cranial nerve deficit. She exhibits normal muscle tone. Coordination normal.  Skin: Skin is warm and dry. She is not diaphoretic.  Nursing note and vitals reviewed.   ED Course  Procedures (including critical care time) Labs Review Labs Reviewed - No data to display  Imaging Review Dg Chest 2 View  05/16/2015  CLINICAL DATA:  Larey Seat yesterday.  Hypertension and COPD. EXAM: CHEST  2 VIEW COMPARISON:  12/10/2013 FINDINGS: Chronic left ventricular prominence. Atherosclerosis of the aorta. The lungs are clear. No effusions. There is an old augmented fracture at T6. Partial compression fractures are seen above that an below that at T7 and T10. Old healed rib fractures on the left as seen previously. IMPRESSION: No acute finding. Left ventricular prominence. Atherosclerosis of the aorta. Old thoracic fractures and old rib fractures. Electronically Signed   By: Paulina Fusi M.D.   On: 05/16/2015 11:40   Dg Hip Unilat With Pelvis 2-3 Views Left  05/16/2015  CLINICAL DATA:  Left posterior hip pain after a fall. Slipped in the bathroom. 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 Other bones of the pelvis are unremarkable. IMPRESSION: No acute or traumatic finding. 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 Electronically Signed   By: Paulina Fusi M.D.    On: 05/16/2015 11:41   I have personally reviewed and evaluated these images and lab results as part of my medical decision-making.    MDM   Final diagnoses:  None    Patient was seen here earlier today after a fall. She was diagnosed with a hip contusion and discharged. Shortly after returning home, her caregivers noted that her blood pressure was elevated. The patient is completely asymptomatic with this and has no other complaints. The caregiver present at bedside expressed discomfort with her returning home with her current blood pressure. Upon discharge it was 166/94. This was after given a dose of metoprolol. She will be discharged with a low dose of metoprolol as she has been on this in the past. To follow-up with primary doctor.    Geoffery Lyons, MD 05/16/15 [phone]

## 2015-05-17 ENCOUNTER — Encounter (HOSPITAL_COMMUNITY): Payer: Self-pay

## 2015-05-17 ENCOUNTER — Emergency Department (HOSPITAL_COMMUNITY)
Admission: EM | Admit: 2015-05-17 | Discharge: 2015-05-18 | Disposition: A | Discharge status: Home / Self Care | Payer: Medicare Other | Attending: Emergency Medicine | Admitting: Emergency Medicine

## 2015-05-17 DIAGNOSIS — F17210 Nicotine dependence, cigarettes, uncomplicated: Secondary | ICD-10-CM | POA: Insufficient documentation

## 2015-05-17 DIAGNOSIS — Z8781 Personal history of (healed) traumatic fracture: Secondary | ICD-10-CM | POA: Insufficient documentation

## 2015-05-17 DIAGNOSIS — I10 Essential (primary) hypertension: Secondary | ICD-10-CM | POA: Insufficient documentation

## 2015-05-17 DIAGNOSIS — Z79899 Other long term (current) drug therapy: Secondary | ICD-10-CM | POA: Insufficient documentation

## 2015-05-17 DIAGNOSIS — R531 Weakness: Secondary | ICD-10-CM | POA: Insufficient documentation

## 2015-05-17 DIAGNOSIS — Y9389 Activity, other specified: Secondary | ICD-10-CM | POA: Insufficient documentation

## 2015-05-17 DIAGNOSIS — Y998 Other external cause status: Secondary | ICD-10-CM | POA: Insufficient documentation

## 2015-05-17 DIAGNOSIS — R296 Repeated falls: Secondary | ICD-10-CM

## 2015-05-17 DIAGNOSIS — Y9289 Other specified places as the place of occurrence of the external cause: Secondary | ICD-10-CM | POA: Insufficient documentation

## 2015-05-17 DIAGNOSIS — J449 Chronic obstructive pulmonary disease, unspecified: Secondary | ICD-10-CM | POA: Insufficient documentation

## 2015-05-17 DIAGNOSIS — W1839XA Other fall on same level, initial encounter: Secondary | ICD-10-CM | POA: Insufficient documentation

## 2015-05-17 DIAGNOSIS — Z043 Encounter for examination and observation following other accident: Secondary | ICD-10-CM | POA: Insufficient documentation

## 2015-05-17 DIAGNOSIS — R410 Disorientation, unspecified: Secondary | ICD-10-CM | POA: Insufficient documentation

## 2015-05-17 DIAGNOSIS — Z8659 Personal history of other mental and behavioral disorders: Secondary | ICD-10-CM | POA: Insufficient documentation

## 2015-05-17 DIAGNOSIS — Z8639 Personal history of other endocrine, nutritional and metabolic disease: Secondary | ICD-10-CM | POA: Insufficient documentation

## 2015-05-17 NOTE — ED Notes (Addendum)
Patient arrives by EMS from home/caregiver. Patient seen here yesterday for weakness and falls, back tonight for increased weakness, unable to ambulate. This is the 3rd ED visit  Since 02/22 for same complaint

## 2015-05-17 NOTE — ED Notes (Signed)
Bed: WN02 Expected date:  Expected time:  Means of arrival:  Comments: EMS 75 yo female lethargy-seen here yesterday for fall

## 2015-05-18 ENCOUNTER — Emergency Department (HOSPITAL_COMMUNITY): Payer: Medicare Other

## 2015-05-18 LAB — URINALYSIS, ROUTINE W REFLEX MICROSCOPIC
Bilirubin Urine: NEGATIVE
Glucose, UA: NEGATIVE mg/dL
Hgb urine dipstick: NEGATIVE
Ketones, ur: NEGATIVE mg/dL
Leukocytes, UA: NEGATIVE
Nitrite: NEGATIVE
Protein, ur: NEGATIVE mg/dL
Specific Gravity, Urine: 1.008 (ref 1.005–1.030)
pH: 5.5 (ref 5.0–8.0)

## 2015-05-18 LAB — CBC WITH DIFFERENTIAL/PLATELET
Basophils Absolute: 0 10*3/uL (ref 0.0–0.1)
Basophils Relative: 0 %
Eosinophils Absolute: 0.1 10*3/uL (ref 0.0–0.7)
Eosinophils Relative: 1 %
HCT: 43.4 % (ref 36.0–46.0)
Hemoglobin: 14.9 g/dL (ref 12.0–15.0)
Lymphocytes Relative: 16 %
Lymphs Abs: 1.7 10*3/uL (ref 0.7–4.0)
MCH: 28.9 pg (ref 26.0–34.0)
MCHC: 34.3 g/dL (ref 30.0–36.0)
MCV: 84.3 fL (ref 78.0–100.0)
Monocytes Absolute: 1.1 10*3/uL — ABNORMAL HIGH (ref 0.1–1.0)
Monocytes Relative: 10 %
Neutro Abs: 7.4 10*3/uL (ref 1.7–7.7)
Neutrophils Relative %: 73 %
Platelets: 402 10*3/uL — ABNORMAL HIGH (ref 150–400)
RBC: 5.15 MIL/uL — ABNORMAL HIGH (ref 3.87–5.11)
RDW: 13.5 % (ref 11.5–15.5)
WBC: 10.2 10*3/uL (ref 4.0–10.5)

## 2015-05-18 LAB — BASIC METABOLIC PANEL
Anion gap: 9 (ref 5–15)
BUN: 13 mg/dL (ref 6–20)
CO2: 24 mmol/L (ref 22–32)
Calcium: 9 mg/dL (ref 8.9–10.3)
Chloride: 95 mmol/L — ABNORMAL LOW (ref 101–111)
Creatinine, Ser: 0.65 mg/dL (ref 0.44–1.00)
GFR calc Af Amer: >60 mL/min (ref >60)
GFR calc non Af Amer: >60 mL/min (ref >60)
Glucose, Bld: 95 mg/dL (ref 65–99)
Potassium: 4.2 mmol/L (ref 3.5–5.1)
Sodium: 128 mmol/L — ABNORMAL LOW (ref 135–145)

## 2015-05-18 LAB — TROPONIN I: Troponin I: <0.03 ng/mL (ref <0.031)

## 2015-05-18 MED ORDER — SODIUM CHLORIDE 0.9 % IV BOLUS (SEPSIS)
1000.0000 mL | Freq: Once | INTRAVENOUS | Status: AC
  Administered 2015-05-18: 1000 mL via INTRAVENOUS

## 2015-05-18 NOTE — Discharge Instructions (Signed)
Fall Prevention in the Home  Falls can cause injuries and can affect people from all age groups. There are many simple things that you can do to make your home safe and to help prevent falls. WHAT CAN I DO ON THE OUTSIDE OF MY HOME?  Regularly repair the edges of walkways and driveways and fix any cracks.  Remove high doorway thresholds.  Trim any shrubbery on the main path into your home.  Use bright outdoor lighting.  Clear walkways of debris and clutter, including tools and rocks.  Regularly check that handrails are securely fastened and in good repair. Both sides of any steps should have handrails.  Install guardrails along the edges of any raised decks or porches.  Have leaves, snow, and ice cleared regularly.  Use sand or salt on walkways during winter months.  In the garage, clean up any spills right away, including grease or oil spills. WHAT CAN I DO IN THE BATHROOM?  Use night lights.  Install grab bars by the toilet and in the tub and shower. Do not use towel bars as grab bars.  Use non-skid mats or decals on the floor of the tub or shower.  If you need to sit down while you are in the shower, use a plastic, non-slip stool.Marland Kitchen  Keep the floor dry. Immediately clean up any water that spills on the floor.  Remove soap buildup in the tub or shower on a regular basis.  Attach bath mats securely with double-sided non-slip rug tape.  Remove throw rugs and other tripping hazards from the floor. WHAT CAN I DO IN THE BEDROOM?  Use night lights.  Make sure that a bedside light is easy to reach.  Do not use oversized bedding that drapes onto the floor.  Have a firm chair that has side arms to use for getting dressed.  Remove throw rugs and other tripping hazards from the floor. WHAT CAN I DO IN THE KITCHEN?   Clean up any spills right away.  Avoid walking on wet floors.  Place frequently used items in easy-to-reach places.  If you need to reach for something  above you, use a sturdy step stool that has a grab bar.  Keep electrical cables out of the way.  Do not use floor polish or wax that makes floors slippery. If you have to use wax, make sure that it is non-skid floor wax.  Remove throw rugs and other tripping hazards from the floor. WHAT CAN I DO IN THE STAIRWAYS?  Do not leave any items on the stairs.  Make sure that there are handrails on both sides of the stairs. Fix handrails that are broken or loose. Make sure that handrails are as long as the stairways.  Check any carpeting to make sure that it is firmly attached to the stairs. Fix any carpet that is loose or worn.  Avoid having throw rugs at the top or bottom of stairways, or secure the rugs with carpet tape to prevent them from moving.  Make sure that you have a light switch at the top of the stairs and the bottom of the stairs. If you do not have them, have them installed. WHAT ARE SOME OTHER FALL PREVENTION TIPS?  Wear closed-toe shoes that fit well and support your feet. Wear shoes that have rubber soles or low heels.  When you use a stepladder, make sure that it is completely opened and that the sides are firmly locked. Have someone hold the ladder while you  are using it. Do not climb a closed stepladder.  Add color or contrast paint or tape to grab bars and handrails in your home. Place contrasting color strips on the first and last steps.  Use mobility aids as needed, such as canes, walkers, scooters, and crutches.  Turn on lights if it is dark. Replace any light bulbs that burn out.  Set up furniture so that there are clear paths. Keep the furniture in the same spot.  Fix any uneven floor surfaces.  Choose a carpet design that does not hide the edge of steps of a stairway.  Be aware of any and all pets.  Review your medicines with your healthcare provider. Some medicines can cause dizziness or changes in blood pressure, which increase your risk of falling. Talk  with your health care provider about other ways that you can decrease your risk of falls. This may include working with a physical therapist or trainer to improve your strength, balance, and endurance.   This information is not intended to replace advice given to you by your health care provider. Make sure you discuss any questions you have with your health care provider.   Document Released: 02/28/2002 Document Revised: 07/25/2014 Document Reviewed: 04/14/2014 Elsevier Interactive Patient Education 2016 [employer].  Fatigue Fatigue is feeling tired all of the time, a lack of energy, or a lack of motivation. Occasional or mild fatigue is often a normal response to activity or life in general. However, long-lasting (chronic) or extreme fatigue may indicate an underlying medical condition. HOME CARE INSTRUCTIONS  Watch your fatigue for any changes. The following actions may help to lessen any discomfort you are feeling:  Talk to your health care provider about how much sleep you need each night. Try to get the required amount every night.  Take medicines only as directed by your health care provider.  Eat a healthy and nutritious diet. Ask your health care provider if you need help changing your diet.  Drink enough fluid to keep your urine clear or pale yellow.  Practice ways of relaxing, such as yoga, meditation, massage therapy, or acupuncture.  Exercise regularly.   Change situations that cause you stress. Try to keep your work and personal routine reasonable.  Do not abuse illegal drugs.  Limit alcohol intake to no more than 1 drink per day for nonpregnant women and 2 drinks per day for men. One drink equals 12 ounces of beer, 5 ounces of wine, or 1 ounces of hard liquor.  Take a multivitamin, if directed by your health care provider. SEEK MEDICAL CARE IF:   Your fatigue does not get better.  You have a fever.   You have unintentional weight loss or gain.  You have  headaches.   You have difficulty:   Falling asleep.  Sleeping throughout the night.  You feel angry, guilty, anxious, or sad.   You are unable to have a bowel movement (constipation).   You skin is dry.   Your legs or another part of your body is swollen.  SEEK IMMEDIATE MEDICAL CARE IF:   You feel confused.   Your vision is blurry.  You feel faint or pass out.   You have a severe headache.   You have severe abdominal, pelvic, or back pain.   You have chest pain, shortness of breath, or an irregular or fast heartbeat.   You are unable to urinate or you urinate less than normal.   You develop abnormal bleeding, such as bleeding  from the rectum, vagina, nose, lungs, or nipples.  You vomit blood.   You have thoughts about harming yourself or committing suicide.   You are worried that you might harm someone else.    This information is not intended to replace advice given to you by your health care provider. Make sure you discuss any questions you have with your health care provider.   Document Released: 01/05/2007 Document Revised: 03/31/2014 Document Reviewed: 07/12/2013 Elsevier Interactive Patient Education [employer]

## 2015-05-18 NOTE — Progress Notes (Signed)
Home in stead CNA [place] provided with [facility] contact information [place] should receive a call from [facility] today about HHRN/PT but they can call to check on status of referrals prn Voiced understanding  Pt sitting up in bed eating and drinking grahams and milk  HOH both sides per pt (smiling)

## 2015-05-18 NOTE — Progress Notes (Addendum)
   05/17/15 0000  CM Assessment  Expected Discharge Plan Home w Home Health Services  In-house Referral NA  Discharge Planning Services CM Consult  [facility] Choice Home Health  Choice offered to / list presented to  Patient  DME Agency [facility]  [facility] Arranged RN;PT;PCS/Personal Care Services  [facility] Agency [facility]  Status of Service Completed, signed off  Discharge Disposition Home w Home Health Services    75 yr old medicare/[employer] [place] pt without family only "very dear friends" and home in stead staff around the clock "since two thousand and fourteen" Pt has [place] with her this morning from home in instead and is willing to go home with pt Reports pt "likes to get up to play games on her computer" " does not like to eat or drink a lot"   Pt in ED x 2 on 05/16/15, 05/17/15 x 1 EDP evaluation indicating no reason for admission Pt is agreeable to [facility] HHRN/PT and [facility] referrals  Cm spoke with Clydie Braun of [facility] [phone] for [facility] (nutrition) HHPT Consult entered in EPIC for [facility] services Pt confirms she has a RW and W/C at home  CM recommended to Home in stead CNA that until HHPT works with pt it would be best to use her w/c to prevent further falls She voiced appreciation and understanding of fall prevention needs Pt alert and oriented x 3 but pt falling asleep throughout Assessment and treatment but noted to answer questions correctly per CNA confirmation

## 2015-05-18 NOTE — ED Provider Notes (Signed)
CSN: [id number]     Arrival date & time 05/17/15  2214 History  By signing my name below, I, Budd Palmer, attest that this documentation has been prepared under the direction and in the presence of Raeford Razor, MD. Electronically Signed: Budd Palmer, ED Scribe. 05/18/2015. 1:08 AM.     Chief Complaint  Patient presents with  . Weakness   The history is provided by the patient and a caregiver. No language interpreter was used.   HPI Comments: [name] is a 75 y.o. female smoker at 0.25 ppd with a PMHx of COPD, HTN, HLD, hypercalcemia, back pain, and anxiety as well as a PSHx of back surgery and left hip arthroplasty brought in by ambulance, who presents to the Emergency Department complaining of worsening weakness onset 3 days ago. Per caregiver, pt lives at home but has around-the-clock care. She notes she was told pt fell. Pt states she lost her balance, causing her to fall. Per caregiver, pt is usually able to ambulate, but has not been able to for the past 3 days. Uses a walker at baseline. She notes that pt was prescribed BP medication during her last ED visit yesterday, but that pt's BP continued to be elevated this evening, in spite of having taken 2 doses of that medication. Caregiver notes pt does not take daily medication, only Tylenol. She strates that pt was prescribed BP medication during her last ED visit yesterday, but that pt's BP continued to be elevated this evening, in spite of having taken 2 doses of that medication. Pt denies n/v/d, urinary symptoms, and any other pains.    Past Medical History  Diagnosis Date  . Back pain   . Hypertension   . COPD (chronic obstructive pulmonary disease) (HCC)   . Anxiety   . Hyperlipemia   . Compression fracture   . Hypercalcemia    Past Surgical History  Procedure Laterality Date  . Back surgery    . Dental surgery    . Hip arthroplasty Left 12/08/2013    Procedure: LEFT HIP HEMI ARTHROPLASTY ;  Surgeon: Javier Docker,  MD;  Location: [facility];  Service: Orthopedics;  Laterality: Left;   No family history on file. Social History  Substance Use Topics  . Smoking status: Current Some Day Smoker -- 0.25 packs/day for 20 years    Types: Cigarettes  . Smokeless tobacco: Never Used  . Alcohol Use: 1.2 oz/week    2 Glasses of wine per week     Comment: wine socially   OB History    No data available     Review of Systems  Gastrointestinal: Negative for nausea, vomiting and diarrhea.  Genitourinary: Negative for dysuria and difficulty urinating.  Musculoskeletal: Negative for myalgias.  Neurological: Positive for weakness.  Psychiatric/Behavioral: Positive for confusion.  All other systems reviewed and are negative.   Allergies  Review of patient's allergies indicates no known allergies.  Home Medications   Prior to Admission medications   Medication Sig Start Date End Date Taking? Authorizing Provider  acetaminophen (TYLENOL) 500 MG tablet Take 2 tablets (1,000 mg total) by mouth every 6 (six) hours as needed for moderate pain. 05/16/15  Yes Lyndal Pulley, MD  metoprolol succinate (TOPROL XL) 25 MG 24 hr tablet Take 1 tablet (25 mg total) by mouth 2 (two) times daily. 05/16/15  Yes Geoffery Lyons, MD  bisacodyl (DULCOLAX) 5 MG EC tablet Take 1 tablet (5 mg total) by mouth daily as needed for moderate constipation. Patient  not taking: Reported on 05/16/2015 12/12/13   Catarina Hartshorn, MD  docusate sodium (COLACE) 100 MG capsule Take 1 capsule (100 mg total) by mouth 2 (two) times daily. Patient not taking: Reported on 05/16/2015 12/08/13   Dorothy Spark, PA-C  enoxaparin (LOVENOX) 40 MG/0.4ML injection Inject 0.4 mLs (40 mg total) into the skin daily. Patient not taking: Reported on 05/16/2015 12/12/13   Catarina Hartshorn, MD  folic acid (FOLVITE) 1 MG tablet Take 1 tablet (1 mg total) by mouth daily. Patient not taking: Reported on 05/16/2015 12/12/13   Catarina Hartshorn, MD  metoprolol tartrate (LOPRESSOR) 25 MG tablet Take 1  tablet (25 mg total) by mouth 2 (two) times daily. Patient not taking: Reported on 05/16/2015 12/12/13   Catarina Hartshorn, MD  tiotropium (SPIRIVA HANDIHALER) 18 MCG inhalation capsule Place 1 capsule (18 mcg total) into inhaler and inhale daily. Patient not taking: Reported on 05/16/2015 12/12/13   Catarina Hartshorn, MD   BP 172/94 mmHg  Pulse 83  Temp(Src) 98.8 F (37.1 C) (Rectal)  Resp 25  SpO2 94% Physical Exam  Constitutional: She appears well-developed and well-nourished. No distress.  Frail-appearing, but not in any distress  HENT:  Head: Normocephalic and atraumatic.  Eyes: Conjunctivae are normal. Right eye exhibits no discharge. Left eye exhibits no discharge.  Pulmonary/Chest: Effort normal. No respiratory distress.  Neurological: She is alert. No cranial nerve deficit. She exhibits normal muscle tone.  Generalized weakness. No focal deficit noted.   Skin: Skin is warm and dry. No rash noted. She is not diaphoretic. No erythema.  Psychiatric: She has a normal mood and affect.  Nursing note and vitals reviewed.   ED Course  Procedures  DIAGNOSTIC STUDIES: Oxygen Saturation is 95% on RA, adequate by my interpretation.    COORDINATION OF CARE: 12:59 AM - Discussed plans to order diagnostic studies. Pt advised of plan for treatment and pt agrees.  Labs Review Labs Reviewed  CBC WITH DIFFERENTIAL/PLATELET - Abnormal; Notable for the following:    RBC 5.15 (*)    Platelets 402 (*)    Monocytes Absolute 1.1 (*)    All other components within normal limits  BASIC METABOLIC PANEL - Abnormal; Notable for the following:    Sodium 128 (*)    Chloride 95 (*)    All other components within normal limits  TROPONIN I  URINALYSIS, ROUTINE W REFLEX MICROSCOPIC (NOT AT [facility])    Imaging Review Dg Chest 2 View  05/16/2015  CLINICAL DATA:  Larey Seat yesterday.  Hypertension and COPD. EXAM: CHEST  2 VIEW COMPARISON:  12/10/2013 FINDINGS: Chronic left ventricular prominence. Atherosclerosis of the  aorta. The lungs are clear. No effusions. There is an old augmented fracture at T6. Partial compression fractures are seen above that an below that at T7 and T10. Old healed rib fractures on the left as seen previously. IMPRESSION: No acute finding. Left ventricular prominence. Atherosclerosis of the aorta. Old thoracic fractures and old rib fractures. Electronically Signed   By: Paulina Fusi M.D.   On: 05/16/2015 11:40   Dg Hip Unilat With Pelvis 2-3 Views Left  05/16/2015  CLINICAL DATA:  Left posterior hip pain after a fall. Slipped in the bathroom. 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 Other bones of the pelvis are unremarkable. IMPRESSION: No acute or traumatic finding. 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 Electronically Signed   By: Paulina Fusi  M.D.   On: 05/16/2015 11:41   I have personally reviewed and evaluated these images and lab results as part of my medical decision-making.   EKG Interpretation   Date/Time:  Friday May 18 2015 01:28:26 EST Ventricular Rate:  83 PR Interval:  161 QRS Duration: 86 QT Interval:  381 QTC Calculation: 448 R Axis:   47 Text Interpretation:  Sinus rhythm Left atrial enlargement Non-specific  ST-t changes Confirmed by Juleen China  MD, Luetta Piazza ([id number]) on 05/18/2015 3:14:35  AM      MDM   Final diagnoses:  Repeated falls  Generalized weakness    74yF brought in by caretakers for evaluation of generalized weakness and repeat falls. Globally weak on exam w/o focal deficit. Afebrile. HD stable. W/u fairly unremarkable. Hyponatremia noted, but this appears chronic. UA clean. CT head w/o acute abnormality. I do not have a clear, correctable reason for her symptoms at this point. No indication for hospitalization, but three ED visits within a 36 hour period. Caretaker that brought her to ED today did so because when she came on shift  the patient was soiled.The patient sat in soiled clothes for several hours because the person she relieved couldn't get the patient up to clean her because the patient was so weak.   At this point I'm not sure what additional help I can provide from the ED. Does not sound like patient would get the care she needs if discharged either. Will keep in ED overnight to see is case management or social work can assist.   I personally preformed the services scribed in my presence. The recorded information has been reviewed is accurate. Raeford Razor, MD.   Raeford Razor, MD 05/18/15 [phone]

## 2015-05-18 NOTE — Progress Notes (Signed)
Spoke with EDP about pt

## 2015-05-19 ENCOUNTER — Encounter (HOSPITAL_COMMUNITY): Payer: Self-pay | Admitting: Emergency Medicine

## 2015-05-19 ENCOUNTER — Inpatient Hospital Stay (HOSPITAL_COMMUNITY)
Admission: EM | Admit: 2015-05-19 | Discharge: 2015-05-23 | DRG: 690 | Disposition: A | Discharge status: Skilled Nursing Facility | Payer: Medicare Other | Attending: Internal Medicine | Admitting: Internal Medicine

## 2015-05-19 ENCOUNTER — Emergency Department (HOSPITAL_COMMUNITY): Payer: Medicare Other

## 2015-05-19 DIAGNOSIS — Z681 Body mass index (BMI) 19 or less, adult: Secondary | ICD-10-CM

## 2015-05-19 DIAGNOSIS — J449 Chronic obstructive pulmonary disease, unspecified: Secondary | ICD-10-CM | POA: Diagnosis present

## 2015-05-19 DIAGNOSIS — N390 Urinary tract infection, site not specified: Principal | ICD-10-CM | POA: Diagnosis present

## 2015-05-19 DIAGNOSIS — M549 Dorsalgia, unspecified: Secondary | ICD-10-CM | POA: Diagnosis present

## 2015-05-19 DIAGNOSIS — R296 Repeated falls: Secondary | ICD-10-CM | POA: Diagnosis present

## 2015-05-19 DIAGNOSIS — E440 Moderate protein-calorie malnutrition: Secondary | ICD-10-CM | POA: Diagnosis present

## 2015-05-19 DIAGNOSIS — R627 Adult failure to thrive: Secondary | ICD-10-CM | POA: Diagnosis present

## 2015-05-19 DIAGNOSIS — F17210 Nicotine dependence, cigarettes, uncomplicated: Secondary | ICD-10-CM | POA: Diagnosis present

## 2015-05-19 DIAGNOSIS — F1010 Alcohol abuse, uncomplicated: Secondary | ICD-10-CM | POA: Diagnosis present

## 2015-05-19 DIAGNOSIS — E785 Hyperlipidemia, unspecified: Secondary | ICD-10-CM | POA: Diagnosis present

## 2015-05-19 DIAGNOSIS — R531 Weakness: Secondary | ICD-10-CM

## 2015-05-19 DIAGNOSIS — E871 Hypo-osmolality and hyponatremia: Secondary | ICD-10-CM | POA: Diagnosis present

## 2015-05-19 DIAGNOSIS — F419 Anxiety disorder, unspecified: Secondary | ICD-10-CM | POA: Diagnosis present

## 2015-05-19 DIAGNOSIS — G8929 Other chronic pain: Secondary | ICD-10-CM | POA: Diagnosis present

## 2015-05-19 DIAGNOSIS — J438 Other emphysema: Secondary | ICD-10-CM | POA: Diagnosis not present

## 2015-05-19 DIAGNOSIS — B9620 Unspecified Escherichia coli [E. coli] as the cause of diseases classified elsewhere: Secondary | ICD-10-CM | POA: Diagnosis present

## 2015-05-19 DIAGNOSIS — I10 Essential (primary) hypertension: Secondary | ICD-10-CM | POA: Diagnosis present

## 2015-05-19 DIAGNOSIS — Z96642 Presence of left artificial hip joint: Secondary | ICD-10-CM | POA: Diagnosis present

## 2015-05-19 DIAGNOSIS — Z79899 Other long term (current) drug therapy: Secondary | ICD-10-CM

## 2015-05-19 DIAGNOSIS — R05 Cough: Secondary | ICD-10-CM

## 2015-05-19 DIAGNOSIS — R059 Cough, unspecified: Secondary | ICD-10-CM

## 2015-05-19 DIAGNOSIS — Z720 Tobacco use: Secondary | ICD-10-CM | POA: Diagnosis present

## 2015-05-19 DIAGNOSIS — I951 Orthostatic hypotension: Secondary | ICD-10-CM

## 2015-05-19 LAB — URINALYSIS, ROUTINE W REFLEX MICROSCOPIC
Glucose, UA: NEGATIVE mg/dL
Ketones, ur: NEGATIVE mg/dL
Nitrite: POSITIVE — AB
PROTEIN: NEGATIVE mg/dL
Specific Gravity, Urine: 1.026 (ref 1.005–1.030)
pH: 5.5 (ref 5.0–8.0)

## 2015-05-19 LAB — CBC WITH DIFFERENTIAL/PLATELET
BASOS ABS: 0 10*3/uL (ref 0.0–0.1)
Basophils Relative: 0 %
EOS PCT: 2 %
Eosinophils Absolute: 0.2 10*3/uL (ref 0.0–0.7)
HCT: 45.7 % (ref 36.0–46.0)
Hemoglobin: 15.3 g/dL — ABNORMAL HIGH (ref 12.0–15.0)
LYMPHS PCT: 18 %
Lymphs Abs: 1.7 10*3/uL (ref 0.7–4.0)
MCH: 29 pg (ref 26.0–34.0)
MCHC: 33.5 g/dL (ref 30.0–36.0)
MCV: 86.6 fL (ref 78.0–100.0)
Monocytes Absolute: 0.9 10*3/uL (ref 0.1–1.0)
Monocytes Relative: 9 %
NEUTROS ABS: 6.8 10*3/uL (ref 1.7–7.7)
NEUTROS PCT: 71 %
PLATELETS: 409 10*3/uL — AB (ref 150–400)
RBC: 5.28 MIL/uL — AB (ref 3.87–5.11)
RDW: 13.6 % (ref 11.5–15.5)
WBC: 9.6 10*3/uL (ref 4.0–10.5)

## 2015-05-19 LAB — URINE MICROSCOPIC-ADD ON

## 2015-05-19 LAB — BASIC METABOLIC PANEL
ANION GAP: 10 (ref 5–15)
BUN: 11 mg/dL (ref 6–20)
CO2: 23 mmol/L (ref 22–32)
Calcium: 9.1 mg/dL (ref 8.9–10.3)
Chloride: 97 mmol/L — ABNORMAL LOW (ref 101–111)
Creatinine, Ser: 0.62 mg/dL (ref 0.44–1.00)
GFR calc Af Amer: >60 mL/min (ref >60)
GLUCOSE: 98 mg/dL (ref 65–99)
POTASSIUM: 4 mmol/L (ref 3.5–5.1)
Sodium: 130 mmol/L — ABNORMAL LOW (ref 135–145)

## 2015-05-19 MED ORDER — CEFTRIAXONE SODIUM 1 G IJ SOLR
1.0000 g | INTRAMUSCULAR | Status: DC
  Administered 2015-05-19 – 2015-05-21 (×3): 1 g via INTRAVENOUS
  Filled 2015-05-19 (×4): qty 10

## 2015-05-19 MED ORDER — SODIUM CHLORIDE 0.9 % IV SOLN
INTRAVENOUS | Status: DC
  Administered 2015-05-19: 23:00:00 via INTRAVENOUS

## 2015-05-19 NOTE — ED Notes (Addendum)
Per EMS, one week prior had difficulty standing/walking. This is patient's 4th visit since 2/22 for the same. They were previously unable to find a clear reason for her inability to walk and was admitted for case management and social work help. Pt's caregiver states the patient is still unable to stand/walk d/t feeling generally weak. Pt's caregiver denies any new symptoms since this started.  Thick cough observed in triage.

## 2015-05-19 NOTE — ED Notes (Signed)
Pt's RN from [facility], [phone] called to state patient could walk until 3 days ago.  Lives alone.  Sister, Kirk Sampley, is occasional caregiver.  [phone] or [phone]

## 2015-05-19 NOTE — ED Provider Notes (Signed)
CSN: [id number]     Arrival date & time 05/19/15  1725 History   First MD Initiated Contact with Patient 05/19/15 2034     Chief Complaint  Patient presents with  . Failure To Thrive     (Consider location/radiation/quality/duration/timing/severity/associated sxs/prior Treatment) HPI Comments: Patient here from home with trouble ambulating 1 week. Review of old records show that this is her fourth visit in the past 3 days for the same symptoms. She had a negative CT the head done yesterday. She did have some mild hyponatremia on one visit. Symptoms have not changed with regard to her weakness since her last visit. She is not had any headache. No loss of bowel or bladder function. No chest pain or shortness of breath. During her last visit, she had a consult performed by case management and patient has 24-hour home health aids at home. She also has a wheelchair as well as a roller walker. No recent falls noted  The history is provided by the patient and a caregiver.    Past Medical History  Diagnosis Date  . Back pain   . Hypertension   . COPD (chronic obstructive pulmonary disease) (HCC)   . Anxiety   . Hyperlipemia   . Compression fracture   . Hypercalcemia    Past Surgical History  Procedure Laterality Date  . Back surgery    . Dental surgery    . Hip arthroplasty Left 12/08/2013    Procedure: LEFT HIP HEMI ARTHROPLASTY ;  Surgeon: Javier Docker, MD;  Location: [facility];  Service: Orthopedics;  Laterality: Left;   History reviewed. No pertinent family history. Social History  Substance Use Topics  . Smoking status: Current Some Day Smoker -- 0.25 packs/day for 20 years    Types: Cigarettes  . Smokeless tobacco: Never Used  . Alcohol Use: 1.2 oz/week    2 Glasses of wine per week     Comment: wine socially   OB History    No data available     Review of Systems  All other systems reviewed and are negative.     Allergies  Review of patient's allergies indicates no  known allergies.  Home Medications   Prior to Admission medications   Medication Sig Start Date End Date Taking? Authorizing Provider  acetaminophen (TYLENOL) 500 MG tablet Take 2 tablets (1,000 mg total) by mouth every 6 (six) hours as needed for moderate pain. 05/16/15  Yes Lyndal Pulley, MD  metoprolol succinate (TOPROL XL) 25 MG 24 hr tablet Take 1 tablet (25 mg total) by mouth 2 (two) times daily. 05/16/15  Yes Geoffery Lyons, MD  metoprolol tartrate (LOPRESSOR) 25 MG tablet Take 1 tablet (25 mg total) by mouth 2 (two) times daily. Patient not taking: Reported on 05/16/2015 12/12/13   Catarina Hartshorn, MD  tiotropium (SPIRIVA HANDIHALER) 18 MCG inhalation capsule Place 1 capsule (18 mcg total) into inhaler and inhale daily. Patient not taking: Reported on 05/16/2015 12/12/13   Catarina Hartshorn, MD   BP 138/96 mmHg  Pulse 82  Temp(Src) 97.5 F (36.4 C) (Oral)  Resp 16  SpO2 92% Physical Exam  Constitutional: She is oriented to person, place, and time. She appears well-developed and well-nourished.  Non-toxic appearance. No distress.  HENT:  Head: Normocephalic and atraumatic.  Eyes: Conjunctivae, EOM and lids are normal. Pupils are equal, round, and reactive to light.  Neck: Normal range of motion. Neck supple. No tracheal deviation present. No thyroid mass present.  Cardiovascular: Normal rate, regular rhythm  and normal heart sounds.  Exam reveals no gallop.   No murmur heard. Pulmonary/Chest: Effort normal and breath sounds normal. No stridor. No respiratory distress. She has no decreased breath sounds. She has no wheezes. She has no rhonchi. She has no rales.  Abdominal: Soft. Normal appearance and bowel sounds are normal. She exhibits no distension. There is no tenderness. There is no rebound and no CVA tenderness.  Musculoskeletal: Normal range of motion. She exhibits no edema or tenderness.  Neurological: She is alert and oriented to person, place, and time. She has normal strength. No cranial nerve  deficit or sensory deficit. GCS eye subscore is 4. GCS verbal subscore is 5. GCS motor subscore is 6.  Reflex Scores:      Patellar reflexes are 2+ on the right side and 2+ on the left side. No facial symmetry. Speech normal. Romberg negative  Skin: Skin is warm and dry. No abrasion and no rash noted.  Psychiatric: She has a normal mood and affect. Her speech is normal. She is slowed.  Nursing note and vitals reviewed.   ED Course  Procedures (including critical care time) Labs Review Labs Reviewed  URINE CULTURE  CBC WITH DIFFERENTIAL/PLATELET  BASIC METABOLIC PANEL  URINALYSIS, ROUTINE W REFLEX MICROSCOPIC (NOT AT [facility])    Imaging Review Dg Chest 2 View  05/19/2015  CLINICAL DATA:  Cough EXAM: CHEST  2 VIEW COMPARISON:  05/16/2015 FINDINGS: Cardiac shadow is stable. The mitral annular calcifications are again seen. The lungs are clear. Pressure deformities are noted within thoracic spine stable from the previous exam. No acute bony abnormality is noted. IMPRESSION: No active cardiopulmonary disease. Electronically Signed   By: Alcide Clever M.D.   On: 05/19/2015 20:40   Ct Head Wo Contrast  05/18/2015  CLINICAL DATA:  Lost balance and fell, confusion. EXAM: CT HEAD WITHOUT CONTRAST TECHNIQUE: Contiguous axial images were obtained from the base of the skull through the vertex without intravenous contrast. COMPARISON:  Head CT 01/06/2011 FINDINGS: No intracranial hemorrhage, mass effect, or midline shift. Generalized atrophy is unchanged. Chronic small vessel ischemia, has progressed in the interim. No hydrocephalus. The basilar cisterns are patent. No evidence of territorial infarct. Remote lacunar infarct in the right thalamus, unchanged. No intracranial fluid collection. Calvarium is intact. Included paranasal sinuses and mastoid air cells are well aerated. IMPRESSION: 1.  No acute intracranial abnormality. 2. Progressive chronic small vessel ischemic change from exam 4.5 years prior.  Atrophy is grossly stable. Electronically Signed   By: Rubye Oaks M.D.   On: 05/18/2015 04:03   I have personally reviewed and evaluated these images and lab results as part of my medical decision-making.   EKG Interpretation None      MDM   Final diagnoses:  Cough    Patient started on IV antibiotics for her UTI. Will be admitted to the medicine service    Lorre Nick, MD 05/19/15 [phone]

## 2015-05-19 NOTE — ED Notes (Signed)
Hospitalist at bedside 

## 2015-05-19 NOTE — ED Notes (Signed)
Pt taken to restroom at her request.  Pt had small bowel movement but no urine to obtain a specimen.

## 2015-05-20 ENCOUNTER — Encounter (HOSPITAL_COMMUNITY): Payer: Self-pay | Admitting: Internal Medicine

## 2015-05-20 DIAGNOSIS — R531 Weakness: Secondary | ICD-10-CM

## 2015-05-20 DIAGNOSIS — J438 Other emphysema: Secondary | ICD-10-CM

## 2015-05-20 DIAGNOSIS — E440 Moderate protein-calorie malnutrition: Secondary | ICD-10-CM

## 2015-05-20 DIAGNOSIS — N390 Urinary tract infection, site not specified: Principal | ICD-10-CM

## 2015-05-20 DIAGNOSIS — E871 Hypo-osmolality and hyponatremia: Secondary | ICD-10-CM

## 2015-05-20 DIAGNOSIS — R627 Adult failure to thrive: Secondary | ICD-10-CM | POA: Diagnosis present

## 2015-05-20 LAB — PHOSPHORUS: Phosphorus: 2.5 mg/dL (ref 2.5–4.6)

## 2015-05-20 LAB — CBC
HEMATOCRIT: 44.5 % (ref 36.0–46.0)
Hemoglobin: 15.1 g/dL — ABNORMAL HIGH (ref 12.0–15.0)
MCH: 29.2 pg (ref 26.0–34.0)
MCHC: 33.9 g/dL (ref 30.0–36.0)
MCV: 85.9 fL (ref 78.0–100.0)
Platelets: 415 10*3/uL — ABNORMAL HIGH (ref 150–400)
RBC: 5.18 MIL/uL — ABNORMAL HIGH (ref 3.87–5.11)
RDW: 13.4 % (ref 11.5–15.5)
WBC: 9.1 10*3/uL (ref 4.0–10.5)

## 2015-05-20 LAB — CREATININE, SERUM
Creatinine, Ser: 0.54 mg/dL (ref 0.44–1.00)
GFR calc Af Amer: >60 mL/min (ref >60)
GFR calc non Af Amer: >60 mL/min (ref >60)

## 2015-05-20 LAB — OSMOLALITY, URINE: OSMOLALITY UR: 376 mosm/kg (ref 300–900)

## 2015-05-20 LAB — SODIUM, URINE, RANDOM: SODIUM UR: 124 mmol/L

## 2015-05-20 LAB — MAGNESIUM: MAGNESIUM: 1.9 mg/dL (ref 1.7–2.4)

## 2015-05-20 MED ORDER — ACETAMINOPHEN 325 MG PO TABS
650.0000 mg | ORAL_TABLET | Freq: Four times a day (QID) | ORAL | Status: DC | PRN
  Administered 2015-05-22: 650 mg via ORAL
  Filled 2015-05-20: qty 2

## 2015-05-20 MED ORDER — IPRATROPIUM-ALBUTEROL 0.5-2.5 (3) MG/3ML IN SOLN
3.0000 mL | Freq: Two times a day (BID) | RESPIRATORY_TRACT | Status: DC
  Administered 2015-05-20 – 2015-05-21 (×3): 3 mL via RESPIRATORY_TRACT
  Filled 2015-05-20 (×3): qty 3

## 2015-05-20 MED ORDER — ONDANSETRON HCL 4 MG PO TABS: 4.0000 mg | ORAL_TABLET | Freq: Four times a day (QID) | ORAL | Status: DC | PRN

## 2015-05-20 MED ORDER — PANTOPRAZOLE SODIUM 40 MG PO TBEC
40.0000 mg | DELAYED_RELEASE_TABLET | Freq: Every day | ORAL | Status: DC
  Administered 2015-05-20 – 2015-05-23 (×4): 40 mg via ORAL
  Filled 2015-05-20 (×4): qty 1

## 2015-05-20 MED ORDER — SODIUM CHLORIDE 0.9 % IV SOLN
INTRAVENOUS | Status: AC
  Administered 2015-05-20: 08:00:00 via INTRAVENOUS

## 2015-05-20 MED ORDER — ONDANSETRON HCL 4 MG/2ML IJ SOLN: 4.0000 mg | Freq: Four times a day (QID) | INTRAMUSCULAR | Status: DC | PRN

## 2015-05-20 MED ORDER — ENSURE ENLIVE PO LIQD
237.0000 mL | Freq: Two times a day (BID) | ORAL | Status: DC
  Administered 2015-05-20 – 2015-05-23 (×5): 237 mL via ORAL

## 2015-05-20 MED ORDER — ALBUTEROL SULFATE (2.5 MG/3ML) 0.083% IN NEBU: 2.5000 mg | INHALATION_SOLUTION | RESPIRATORY_TRACT | Status: DC | PRN

## 2015-05-20 MED ORDER — ENOXAPARIN SODIUM 40 MG/0.4ML ~~LOC~~ SOLN
40.0000 mg | SUBCUTANEOUS | Status: DC
Start: 2015-05-20 — End: 2015-05-23
  Administered 2015-05-20 – 2015-05-23 (×4): 40 mg via SUBCUTANEOUS
  Filled 2015-05-20 (×4): qty 0.4

## 2015-05-20 MED ORDER — HYDRALAZINE HCL 20 MG/ML IJ SOLN
5.0000 mg | INTRAMUSCULAR | Status: DC | PRN
  Administered 2015-05-20: 5 mg via INTRAVENOUS
  Filled 2015-05-20: qty 1

## 2015-05-20 MED ORDER — METOPROLOL SUCCINATE ER 25 MG PO TB24
25.0000 mg | ORAL_TABLET | Freq: Two times a day (BID) | ORAL | Status: DC
  Administered 2015-05-20 – 2015-05-23 (×7): 25 mg via ORAL
  Filled 2015-05-20 (×7): qty 1

## 2015-05-20 MED ORDER — IPRATROPIUM-ALBUTEROL 0.5-2.5 (3) MG/3ML IN SOLN
3.0000 mL | Freq: Four times a day (QID) | RESPIRATORY_TRACT | Status: DC
  Administered 2015-05-20: 3 mL via RESPIRATORY_TRACT
  Filled 2015-05-20: qty 3

## 2015-05-20 NOTE — Progress Notes (Signed)
[facility] Progress Note   [name]  ZOX:[id number]  DOB: 1940/11/07  DOA: 05/19/2015 PCP: Gweneth Dimitri, MD  Brief narrative: [name] is a 75 y.o. female with hypertension, COPD, ongoing tobacco abuse, chronic back pain who presents to the hospital for weakness and difficulty with standing and ambulating. Symptoms have been progressive over the past 2 weeks. This is her fourth visit to the ER since the 2/22 with the same complaints of generalized weakness and frequent falls. She is found to have acute on chronic hyponatremia with a sodium of 128 and a UTI.   Subjective: Confused. No complaints of focal weakness, cough, dyspnea, nausea, vomiting or pain.  Assessment/Plan: Principal Problem:   UTI (lower urinary tract infection) -Continue to treat with ceftriaxone and follow-up on cultures  Active Problems:   Chronic hyponatremia -Possibly secondary to poor oral intake vs SIADH -continue IV fluids for now    COPD (chronic obstructive pulmonary disease) -This is stable    HTN (hypertension) -Continue metoprolol and when necessary hydralazine    Alcohol abuse/  Nicotine abuse    Malnutrition of moderate degree -Start dietary supplements    Weakness/frequent falls -We'll obtain a PT eval      Antibiotics: Anti-infectives    Start     Dose/Rate Route Frequency Ordered Stop   05/19/15 2200  cefTRIAXone (ROCEPHIN) 1 g in dextrose 5 % 50 mL IVPB     1 g 100 mL/hr over 30 Minutes Intravenous Every 24 hours 05/19/15 2156       Code Status:     Code Status Orders        Start     Ordered   05/20/15 0707  Full code   Continuous     05/20/15 0706    Code Status History    Date Active Date Inactive Code Status Order ID Comments User Context   12/08/2013  2:38 PM 12/12/2013  4:28 PM Full Code [id number]  Javier Docker, MD Inpatient   08/17/2012  6:10 PM 08/23/2012  6:36 PM Full Code [id number]  Laveda Norman, MD Inpatient     Family Communication:   Disposition Plan: Hopefully will be ready to discharge in 2-3 days DVT prophylaxis: Lovenox Consultants:  Procedures:     Objective: Filed Weights   05/20/15 0100  Weight: 49.034 kg (108 lb 1.6 oz)    Intake/Output Summary (Last 24 hours) at 05/20/15 0805 Last data filed at 05/20/15 6578  Gross per 24 hour  Intake    175 ml  Output    103 ml  Net     72 ml     Vitals Filed Vitals:   05/20/15 0016 05/20/15 0030 05/20/15 0100 05/20/15 0537  BP: 179/94 156/78 183/91 172/86  Pulse: 71 70 75 81  Temp:   97.3 F (36.3 C) 97.7 F (36.5 C)  TempSrc:   Axillary Oral  Resp: Height:    (1.702 m)   Weight:   49.034 kg (108 lb 1.6 oz)   SpO2: 98% 94% 96% 96%    Exam:  General:  Pt is alert, Confused to time and place, not in acute distress  HEENT: No icterus, No thrush, oral mucosa moist  Cardiovascular: regular rate and rhythm, S1/S2 No murmur  Respiratory: clear to auscultation bilaterally   Abdomen: Soft, +Bowel sounds, non tender, non distended, no guarding  MSK: No cyanosis or clubbing- no pedal edema   Data Reviewed: Basic Metabolic Panel:  Recent  Labs Lab 05/16/15 1140 05/18/15 0100 05/19/15 2055  NA 132* 128* 130*  K 4.2 4.2 4.0  CL 95* 95* 97*  CO2 GLUCOSE 73 95 98  BUN CREATININE 0.62 0.65 0.62  CALCIUM 9.5 9.0 9.1   Liver Function Tests: No results for input(s): AST, ALT, ALKPHOS, BILITOT, PROT, ALBUMIN in the last 168 hours. No results for input(s): LIPASE, AMYLASE in the last 168 hours. No results for input(s): AMMONIA in the last 168 hours. CBC:  Recent Labs Lab 05/16/15 1140 05/18/15 0100 05/19/15 2055  WBC 8.1 10.2 9.6  NEUTROABS 5.9 7.4 6.8  HGB 15.8* 14.9 15.3*  HCT 47.7* 43.4 45.7  MCV 87.2 84.3 86.6  PLT 404* 402* 409*   Cardiac Enzymes:  Recent Labs Lab 05/18/15 0100  TROPONINI <0.03   BNP (last 3 results) No results for input(s): BNP in the last 8760 hours.  ProBNP (last 3  results) No results for input(s): PROBNP in the last 8760 hours.  CBG: No results for input(s): GLUCAP in the last 168 hours.  No results found for this or any previous visit (from the past 240 hour(s)).   Studies: Dg Chest 2 View  05/19/2015  CLINICAL DATA:  Cough EXAM: CHEST  2 VIEW COMPARISON:  05/16/2015 FINDINGS: Cardiac shadow is stable. The mitral annular calcifications are again seen. The lungs are clear. Pressure deformities are noted within thoracic spine stable from the previous exam. No acute bony abnormality is noted. IMPRESSION: No active cardiopulmonary disease. Electronically Signed   By: Alcide Clever M.D.   On: 05/19/2015 20:40    Scheduled Meds:  Scheduled Meds: . cefTRIAXone (ROCEPHIN)  IV  1 g Intravenous Q24H  . enoxaparin (LOVENOX) injection  40 mg Subcutaneous Q24H  . ipratropium-albuterol  3 mL Nebulization Q6H  . metoprolol succinate  25 mg Oral BID  . pantoprazole  40 mg Oral Daily   Continuous Infusions: . sodium chloride      Time spent on care of this patient: 35 min   Shenae Bonanno, MD 05/20/2015, 8:05 AM  LOS: 1 day   [facility] Office  [phone] Pager - Text Page per [email or web address] If 7PM-7AM, please contact night-coverage [email or web address]

## 2015-05-20 NOTE — Progress Notes (Signed)
This RN called home instead senior care in order to obtain admission history information. No response from agency at this time. Will continue to attempt.   Private sitter at bedside unable to provide information necessary to complete assessment.

## 2015-05-20 NOTE — H&P (Signed)
[facility] History and Physical  [name]:[id number] DOB: 06/24/1940 DOA: 05/19/2015  Referring physician: Lorre Nick, M.D. PCP: Gweneth Dimitri, MD   Chief Complaint: Failure to thrive.  HPI: [name] is a 75 y.o. female with a past medical history of chronic back pain, hypertension, COPD, tobacco abuse, anxiety, hyperlipidemia who is coming to the emergency department via EMS due to weakness and difficulty standing up and ambulating.  The patient could not elaborate much on her symptoms, but states that she has been progressively weak in the past 2 weeks. This is her fourth visit since the 22nd this month for the same.   Workup in the emergency department today shows an urinalysis consistent with UTI and hyponatremia. She had a CT scan of her head yesterday which did not show any acute abnormalities. She is being admitted for UTI and failure to thrive for IV antibiotic therapy and evaluation by social services.   Review of Systems:  Constitutional: Positive chills, fatigue.  No weight loss, night sweats, Fevers HEENT:  No headaches, Difficulty swallowing,Tooth/dental problems,Sore throat,  No sneezing, itching, ear ache, nasal congestion, post nasal drip,  Cardio-vascular:  No chest pain, Orthopnea, PND, swelling in lower extremities, anasarca, dizziness, palpitations  GI:  Decreased of appetite  No heartburn, indigestion, abdominal pain, nausea, vomiting, diarrhea, change in bowel habits Resp:  Occasional dyspnea, occasional productive cough. No wheezing or hemoptysis. Skin:  no rash or lesions.  GU:  no dysuria, change in color of urine, no urgency or frequency. No flank pain.  Musculoskeletal:  No joint pain or swelling. No decreased range of motion. No back pain.  Psych:  No change in mood or affect. No depression or anxiety. No memory loss.   Past Medical History  Diagnosis Date  . Back pain   . Hypertension   . COPD (chronic obstructive  pulmonary disease) (HCC)   . Anxiety   . Hyperlipemia   . Compression fracture   . Hypercalcemia    Past Surgical History  Procedure Laterality Date  . Back surgery    . Dental surgery    . Hip arthroplasty Left 12/08/2013    Procedure: LEFT HIP HEMI ARTHROPLASTY ;  Surgeon: Javier Docker, MD;  Location: [facility];  Service: Orthopedics;  Laterality: Left;   Social History:  reports that she has been smoking Cigarettes.  She has a 5 pack-year smoking history. She has never used smokeless tobacco. She reports that she drinks about 1.2 oz of alcohol per week. She reports that she does not use illicit drugs.  No Known Allergies  History reviewed. No pertinent family history.   Prior to Admission medications   Medication Sig Start Date End Date Taking? Authorizing Provider  acetaminophen (TYLENOL) 500 MG tablet Take 2 tablets (1,000 mg total) by mouth every 6 (six) hours as needed for moderate pain. 05/16/15  Yes Lyndal Pulley, MD  metoprolol succinate (TOPROL XL) 25 MG 24 hr tablet Take 1 tablet (25 mg total) by mouth 2 (two) times daily. 05/16/15  Yes Geoffery Lyons, MD  metoprolol tartrate (LOPRESSOR) 25 MG tablet Take 1 tablet (25 mg total) by mouth 2 (two) times daily. Patient not taking: Reported on 05/16/2015 12/12/13   Catarina Hartshorn, MD  tiotropium (SPIRIVA HANDIHALER) 18 MCG inhalation capsule Place 1 capsule (18 mcg total) into inhaler and inhale daily. Patient not taking: Reported on 05/16/2015 12/12/13   Catarina Hartshorn, MD   Physical Exam: Filed Vitals:   05/19/15 1752 05/19/15 2243 05/19/15  2330 05/20/15 0000  BP: 138/96 162/92 162/93 179/94  Pulse: 82 81 78 77  Temp: 97.5 F (36.4 C) 97.8 F (36.6 C)    TempSrc: Oral Oral    Resp: 16 16    SpO2: 92% 95% 96% 99%    Wt Readings from Last 3 Encounters:  12/26/13 50.168 kg (110 lb 9.6 oz)  12/07/13 48.9 kg (107 lb 12.9 oz)  09/16/12 50.621 kg (111 lb 9.6 oz)    General:  Appears calm and comfortable Eyes: PERRL, normal lids, irises  & conjunctiva ENT: grossly normal hearing, lips & tongue Neck: no LAD, masses or thyromegaly Cardiovascular: RRR, no m/r/g. No LE edema. Telemetry: SR, no arrhythmias  Respiratory: Mild rhonchi bilaterally. Abdomen: soft, ntnd Skin: no rash or induration seen on limited exam Musculoskeletal: grossly normal tone BUE/BLE Psychiatric: grossly normal mood and affect, speech fluent and appropriate, but slowed. Neurologic: Awake, alert, oriented 3, the patient was slow to time orientation, she is grossly                    non-focal.          Labs on Admission:  Basic Metabolic Panel:  Recent Labs Lab 05/16/15 1140 05/18/15 0100 05/19/15 2055  NA 132* 128* 130*  K 4.2 4.2 4.0  CL 95* 95* 97*  CO2 27 24 23   GLUCOSE 73 95 98  BUN 10 13 11   CREATININE 0.62 0.65 0.62  CALCIUM 9.5 9.0 9.1   CBC:  Recent Labs Lab 05/16/15 1140 05/18/15 0100 05/19/15 2055  WBC 8.1 10.2 9.6  NEUTROABS 5.9 7.4 6.8  HGB 15.8* 14.9 15.3*  HCT 47.7* 43.4 45.7  MCV 87.2 84.3 86.6  PLT 404* 402* 409*   Cardiac Enzymes:  Recent Labs Lab 05/18/15 0100  TROPONINI <0.03    Radiological Exams on Admission: Dg Chest 2 View  05/19/2015  CLINICAL DATA:  Cough EXAM: CHEST  2 VIEW COMPARISON:  05/16/2015 FINDINGS: Cardiac shadow is stable. The mitral annular calcifications are again seen. The lungs are clear. Pressure deformities are noted within thoracic spine stable from the previous exam. No acute bony abnormality is noted. IMPRESSION: No active cardiopulmonary disease. Electronically Signed   By: Alcide Clever M.D.   On: 05/19/2015 20:40   Ct Head Wo Contrast  05/18/2015  CLINICAL DATA:  Lost balance and fell, confusion. EXAM: CT HEAD WITHOUT CONTRAST TECHNIQUE: Contiguous axial images were obtained from the base of the skull through the vertex without intravenous contrast. COMPARISON:  Head CT 01/06/2011 FINDINGS: No intracranial hemorrhage, mass effect, or midline shift. Generalized atrophy is  unchanged. Chronic small vessel ischemia, has progressed in the interim. No hydrocephalus. The basilar cisterns are patent. No evidence of territorial infarct. Remote lacunar infarct in the right thalamus, unchanged. No intracranial fluid collection. Calvarium is intact. Included paranasal sinuses and mastoid air cells are well aerated. IMPRESSION: 1.  No acute intracranial abnormality. 2. Progressive chronic small vessel ischemic change from exam 4.5 years prior. Atrophy is grossly stable. Electronically Signed   By: Rubye Oaks M.D.   On: 05/18/2015 04:03    EKG: Independently reviewed.  Vent. rate 83 BPM PR interval 161 ms QRS duration 86 ms QT/QTc 381/448 ms P-R-T axes 73 47 62 Sinus rhythm Left atrial enlargement Non-specific ST-t changes  Assessment/Plan Principal Problem:   UTI (lower urinary tract infection) Admit to MedSurg. Continue gentle IV hydration. Continue Rocephin 1 g every 24 hours. Follow-up urine culture and sensitivity.  Active Problems:  Chronic hyponatremia Continue gentle IV hydration with normal saline.    COPD (chronic obstructive pulmonary disease) (HCC) Continue supplemental oxygen. Continue bronchodilators as needed.    HTN (hypertension) Continue metoprolol 25 mg by mouth twice a day. Monitor blood pressure.    Alcohol abuse Per patient she only drinks moderate amounts a couple times a week. Not sure if the patient is underestimating her alcohol consumption. Monitor for signs of withdrawal.    Nicotine abuse Declined nicotine replacement therapy.    Failure to thrive in an adult.   Malnutrition of moderate degree (HCC)   Weakness Continue treatment for her UTI. Social services to evaluate during hospitalization.    Code Status: Full Code. DVT Prophylaxis: Lovenox SQ. Family Communication:  Disposition Plan: Admit for IV antibiotic therapy.  Time spent: Over 70 minutes was spent during the process of his admission.  Bobette Mo, M.D. [facility] Pager [phone].

## 2015-05-20 NOTE — Progress Notes (Signed)
In and out cath performed per MD order to obtain specimen for urine sodium and osmolality as pt is incontinent and not able to provide specimen.

## 2015-05-20 NOTE — Progress Notes (Signed)
Pt transferred from ED. Vitals WDL. Resting in bed. Sitter from home health agency at bedside, but unable to assist answering admission questions at this time. She said to ask the the replacement sitter in the AM. Pt disoriented to time, place, and situation and unable to answer admission questions at this time. When asked the year she stated, "1960."

## 2015-05-21 LAB — CBC WITH DIFFERENTIAL/PLATELET
BASOS ABS: 0 10*3/uL (ref 0.0–0.1)
BASOS PCT: 1 %
EOS ABS: 0.2 10*3/uL (ref 0.0–0.7)
EOS PCT: 3 %
HCT: 41.5 % (ref 36.0–46.0)
Hemoglobin: 13.4 g/dL (ref 12.0–15.0)
LYMPHS ABS: 1.4 10*3/uL (ref 0.7–4.0)
Lymphocytes Relative: 18 %
MCH: 28 pg (ref 26.0–34.0)
MCHC: 32.3 g/dL (ref 30.0–36.0)
MCV: 86.6 fL (ref 78.0–100.0)
Monocytes Absolute: 0.8 10*3/uL (ref 0.1–1.0)
Monocytes Relative: 10 %
Neutro Abs: 5.4 10*3/uL (ref 1.7–7.7)
Neutrophils Relative %: 68 %
PLATELETS: 389 10*3/uL (ref 150–400)
RBC: 4.79 MIL/uL (ref 3.87–5.11)
RDW: 13.5 % (ref 11.5–15.5)
WBC: 7.9 10*3/uL (ref 4.0–10.5)

## 2015-05-21 LAB — COMPREHENSIVE METABOLIC PANEL
ALT: 16 U/L (ref 14–54)
AST: 20 U/L (ref 15–41)
Albumin: 3.1 g/dL — ABNORMAL LOW (ref 3.5–5.0)
Alkaline Phosphatase: 57 U/L (ref 38–126)
Anion gap: 9 (ref 5–15)
BUN: 8 mg/dL (ref 6–20)
CHLORIDE: 97 mmol/L — AB (ref 101–111)
CO2: 26 mmol/L (ref 22–32)
CREATININE: 0.53 mg/dL (ref 0.44–1.00)
Calcium: 8.6 mg/dL — ABNORMAL LOW (ref 8.9–10.3)
GFR calc non Af Amer: >60 mL/min (ref >60)
Glucose, Bld: 95 mg/dL (ref 65–99)
Potassium: 3.5 mmol/L (ref 3.5–5.1)
SODIUM: 132 mmol/L — AB (ref 135–145)
Total Bilirubin: 0.6 mg/dL (ref 0.3–1.2)
Total Protein: 5.9 g/dL — ABNORMAL LOW (ref 6.5–8.1)

## 2015-05-21 LAB — CBC AND DIFFERENTIAL: WBC: 7.9 10^3/mL

## 2015-05-21 MED ORDER — SODIUM CHLORIDE 0.9 % IV SOLN
INTRAVENOUS | Status: DC
  Administered 2015-05-21: 14:00:00 via INTRAVENOUS
  Administered 2015-05-23: 1000 mL via INTRAVENOUS

## 2015-05-21 NOTE — Progress Notes (Signed)
[facility] Progress Note   [name]  ZOX:[id number]  DOB: September 25, 1940  DOA: 05/19/2015 PCP: Gweneth Dimitri, MD  Brief narrative: [name] is a 75 y.o. female with hypertension, COPD, ongoing tobacco abuse, chronic back pain who presents to the hospital for weakness and difficulty with standing and ambulating. Symptoms have been progressive over the past 2 weeks. This is her fourth visit to the ER since the 2/22 with the same complaints of generalized weakness and frequent falls. She is found to have acute on chronic hyponatremia with a sodium of 128 and a UTI.   Subjective: Continued to be confused. No complaints of focal weakness, cough, dyspnea, nausea, vomiting or pain.  Assessment/Plan: Principal Problem:   UTI (lower urinary tract infection) -Continue to treat with ceftriaxone - cultures growing E coli  Active Problems:   Acute on Chronic hyponatremia -Possibly secondary to poor oral intake - continue IV fluids for now    COPD (chronic obstructive pulmonary disease) -This is stable    HTN (hypertension) -Continue metoprolol and when necessary hydralazine    Alcohol abuse/  Nicotine abuse    Malnutrition of moderate degree -Start dietary supplements    Weakness/frequent falls - PT recommends SNF vs 24 care at home   Antibiotics: Anti-infectives    Start     Dose/Rate Route Frequency Ordered Stop   05/19/15 2200  cefTRIAXone (ROCEPHIN) 1 g in dextrose 5 % 50 mL IVPB     1 g 100 mL/hr over 30 Minutes Intravenous Every 24 hours 05/19/15 2156       Code Status:     Code Status Orders        Start     Ordered   05/20/15 0707  Full code   Continuous     05/20/15 0706    Code Status History    Date Active Date Inactive Code Status Order ID Comments User Context   12/08/2013  2:38 PM 12/12/2013  4:28 PM Full Code [id number]  Javier Docker, MD Inpatient   08/17/2012  6:10 PM 08/23/2012  6:36 PM Full Code [id number]  Laveda Norman, MD Inpatient      Family Communication:  Disposition Plan: Hopefully will be ready to discharge by tomorrow DVT prophylaxis: Lovenox Consultants:  Procedures:     Objective: Filed Weights   05/20/15 0100  Weight: 49.034 kg (108 lb 1.6 oz)    Intake/Output Summary (Last 24 hours) at 05/21/15 1312 Last data filed at 05/20/15 2210  Gross per 24 hour  Intake  821.8 ml  Output    300 ml  Net  521.8 ml     Vitals Filed Vitals:   05/20/15 1421 05/20/15 2106 05/21/15 0414 05/21/15 0926  BP: 158/72 151/77 168/86   Pulse: 77 77 66 70  Temp: 97.4 F (36.3 C) 97.7 F (36.5 C) 97.7 F (36.5 C)   TempSrc: Oral Oral Oral   Resp: Height:      Weight:      SpO2: 97% 97% 99% 98%    Exam:  General:  Pt is alert, Confused to time and place, not in acute distress  HEENT: No icterus, No thrush, oral mucosa moist  Cardiovascular: regular rate and rhythm, S1/S2 No murmur  Respiratory: clear to auscultation bilaterally   Abdomen: Soft, +Bowel sounds, non tender, non distended, no guarding  MSK: No cyanosis or clubbing- no pedal edema   Data Reviewed: Basic Metabolic Panel:  Recent Labs Lab 05/16/15 1140 05/18/15  0100 05/19/15 2055 05/20/15 0804 05/21/15 0404  NA 132* 128* 130*  --  132*  K 4.2 4.2 4.0  --  3.5  CL 95* 95* 97*  --  97*  CO2 --  26  GLUCOSE 73 95 98  --  95  BUN --  8  CREATININE 0.62 0.65 0.62 0.54 0.53  CALCIUM 9.5 9.0 9.1  --  8.6*  MG  --   --   --  1.9  --   PHOS  --   --   --  2.5  --    Liver Function Tests:  Recent Labs Lab 05/21/15 0404  AST 20  ALT 16  ALKPHOS 57  BILITOT 0.6  PROT 5.9*  ALBUMIN 3.1*   No results for input(s): LIPASE, AMYLASE in the last 168 hours. No results for input(s): AMMONIA in the last 168 hours. CBC:  Recent Labs Lab 05/16/15 1140 05/18/15 0100 05/19/15 2055 05/20/15 0804 05/21/15 0404  WBC 8.1 10.2 9.6 9.1 7.9  NEUTROABS 5.9 7.4 6.8  --  5.4  HGB 15.8* 14.9 15.3* 15.1* 13.4   HCT 47.7* 43.4 45.7 44.5 41.5  MCV 87.2 84.3 86.6 85.9 86.6  PLT 404* 402* 409* 415* 389   Cardiac Enzymes:  Recent Labs Lab 05/18/15 0100  TROPONINI <0.03   BNP (last 3 results) No results for input(s): BNP in the last 8760 hours.  ProBNP (last 3 results) No results for input(s): PROBNP in the last 8760 hours.  CBG: No results for input(s): GLUCAP in the last 168 hours.  Recent Results (from the past 240 hour(s))  Urine culture     Status: None (Preliminary result)   Collection Time: 05/19/15  9:15 PM  Result Value Ref Range Status   Specimen Description URINE, CATHETERIZED  Final   Special Requests NONE  Final   Culture   Final    >=100,000 COLONIES/mL ESCHERICHIA COLI Performed at [facility]    Report Status PENDING  Incomplete     Studies: Dg Chest 2 View  05/19/2015  CLINICAL DATA:  Cough EXAM: CHEST  2 VIEW COMPARISON:  05/16/2015 FINDINGS: Cardiac shadow is stable. The mitral annular calcifications are again seen. The lungs are clear. Pressure deformities are noted within thoracic spine stable from the previous exam. No acute bony abnormality is noted. IMPRESSION: No active cardiopulmonary disease. Electronically Signed   By: Alcide Clever M.D.   On: 05/19/2015 20:40    Scheduled Meds:  Scheduled Meds: . cefTRIAXone (ROCEPHIN)  IV  1 g Intravenous Q24H  . enoxaparin (LOVENOX) injection  40 mg Subcutaneous Q24H  . feeding supplement (ENSURE ENLIVE)  237 mL Oral BID BM  . ipratropium-albuterol  3 mL Nebulization BID  . metoprolol succinate  25 mg Oral BID  . pantoprazole  40 mg Oral Daily   Continuous Infusions:    Time spent on care of this patient: 35 min   Kemonie Cutillo, MD 05/21/2015, 1:12 PM  LOS: 2 days   [facility] Office  [phone] Pager - Text Page per [email or web address] If 7PM-7AM, please contact night-coverage [email or web address]

## 2015-05-21 NOTE — Evaluation (Addendum)
Physical Therapy Evaluation Patient Details Name: [name] MRN: [id number] DOB: [birth year] Today's Date: 05/21/2015   History of Present Illness  [name] is a 75 y.o. female with a past medical history of chronic back pain, hypertension, COPD, tobacco abuse, ETOH,  anxiety, hyperlipidemia who is coming to the emergency department via EMS due to weakness and difficulty standing up and ambulating.  Clinical Impression  Pt admitted with above diagnosis. Pt currently with functional limitations due to the deficits listed below (see PT Problem List).  Pt will benefit from skilled PT to increase their independence and safety with mobility to allow discharge to the venue listed below.  eval limited d/t pt confusion, has 24hr care at home but unsure how much physical assist caregivers can provide, will follow     Follow Up Recommendations Home health PT;Supervision/Assistance - 24 hour;SNF (depending on progress)    Equipment Recommendations  None recommended by PT    Recommendations for Other Services       Precautions / Restrictions Precautions Precautions: Fall Restrictions Weight Bearing Restrictions: No      Mobility  Bed Mobility Overal bed mobility: Needs Assistance Bed Mobility: Rolling Rolling: Mod assist;Max assist         General bed mobility comments: pt requiring incr time, multi-modal cues to participate d/t confusion, assist to roll right and left, uses bed rail; +2 total assist to scoot up in bed from supine position  Transfers                 General transfer comment: NT with +1 assist d/t pt level of confusion  Ambulation/Gait                Stairs            Wheelchair Mobility    Modified Rankin (Stroke Patients Only)       Balance                                             Pertinent Vitals/Pain Pain Assessment: No/denies pain    Home Living Family/patient expects to be discharged to:: Private  residence Living Arrangements: Alone Available Help at Discharge: Available 24 hours/day Type of Home: House Home Access: Level entry     Home Layout: One level Home Equipment: Environmental consultant - 2 wheels      Prior Function Level of Independence: Needs assistance   Gait / Transfers Assistance Needed: amb with RW, mod I until about 1 wk ago per caregiver  ADL's / Homemaking Assistance Needed: assist with bathing and dressing  Comments: pt has 24hr care, no family present and caregiver only able to give limited info     Hand Dominance   Dominant Hand: Right    Extremity/Trunk Assessment   Upper Extremity Assessment: Defer to OT evaluation;Generalized weakness           Lower Extremity Assessment: LLE deficits/detail;Difficult to assess due to impaired cognition   LLE Deficits / Details: grossly 3 to 3+/5, pt not fully cooperative d/t cognition; RLE grossly 3+/5     Communication   Communication: HOH  Cognition Arousal/Alertness: Awake/alert Behavior During Therapy: WFL for tasks assessed/performed Overall Cognitive Status: Impaired/Different from baseline Area of Impairment: Orientation;Attention;Following commands;Safety/judgement;Problem solving Orientation Level: Disoriented to;Situation;Time;Place     Following Commands: Follows one step commands inconsistently Safety/Judgement: Decreased awareness of deficits   Problem  Solving: Difficulty sequencing;Requires verbal cues;Requires tactile cues      General Comments      Exercises        Assessment/Plan    PT Assessment Patient needs continued PT services  PT Diagnosis Difficulty walking   PT Problem List Decreased strength;Decreased activity tolerance;Decreased mobility  PT Treatment Interventions DME instruction;Gait training;Functional mobility training;Therapeutic activities;Patient/family education;Therapeutic exercise   PT Goals (Current goals can be found in the Care Plan section) Acute Rehab PT  Goals PT Goal Formulation: Patient unable to participate in goal setting Time For Goal Achievement: 06/04/15 Potential to Achieve Goals: Good    Frequency Min 3X/week   Barriers to discharge        Co-evaluation               End of Session   Activity Tolerance: Other (comment) (limited by cognition) Patient left: with call bell/phone within reach           Time: 1204-1218 PT Time Calculation (min) (ACUTE ONLY): 14 min   Charges:   PT Evaluation $PT Eval Low Complexity: 1 Procedure     PT G CodesDrucilla Chalet 06-10-15, 12:39 PM

## 2015-05-22 DIAGNOSIS — R627 Adult failure to thrive: Secondary | ICD-10-CM

## 2015-05-22 LAB — URINE CULTURE: Culture: >=100000

## 2015-05-22 LAB — BASIC METABOLIC PANEL
Anion gap: 8 (ref 5–15)
BUN: 11 mg/dL (ref 6–20)
CALCIUM: 9.2 mg/dL (ref 8.9–10.3)
CHLORIDE: 98 mmol/L — AB (ref 101–111)
CO2: 27 mmol/L (ref 22–32)
Creatinine, Ser: 0.51 mg/dL (ref 0.44–1.00)
GFR calc Af Amer: >60 mL/min (ref >60)
GFR calc non Af Amer: >60 mL/min (ref >60)
GLUCOSE: 103 mg/dL — AB (ref 65–99)
Potassium: 4 mmol/L (ref 3.5–5.1)
Sodium: 133 mmol/L — ABNORMAL LOW (ref 135–145)

## 2015-05-22 MED ORDER — DICLOFENAC SODIUM 1 % TD GEL
2.0000 g | Freq: Four times a day (QID) | TRANSDERMAL | Status: DC
  Administered 2015-05-22 – 2015-05-23 (×6): 2 g via TOPICAL
  Filled 2015-05-22: qty 100

## 2015-05-22 MED ORDER — IPRATROPIUM-ALBUTEROL 0.5-2.5 (3) MG/3ML IN SOLN: 3.0000 mL | RESPIRATORY_TRACT | Status: DC | PRN

## 2015-05-22 MED ORDER — SODIUM CHLORIDE 0.9 % IV BOLUS (SEPSIS)
1000.0000 mL | Freq: Once | INTRAVENOUS | Status: AC
  Administered 2015-05-22: 1000 mL via INTRAVENOUS

## 2015-05-22 MED ORDER — AMLODIPINE BESYLATE 10 MG PO TABS
10.0000 mg | ORAL_TABLET | Freq: Every day | ORAL | Status: DC
  Administered 2015-05-22 – 2015-05-23 (×2): 10 mg via ORAL
  Filled 2015-05-22 (×2): qty 1

## 2015-05-22 NOTE — Clinical Social Work Note (Signed)
Clinical Social Work Assessment  Patient Details  Name: [name] MRN: [id number] Date of Birth: [birth year]  Date of referral:  05/22/15               Reason for consult:  Discharge Planning                Permission sought to share information with:  Family Supports Permission granted to share information::  Yes, Verbal Permission Granted  Name::     [name]  Agency::     Relationship::  sister-in-law  Contact Information:  [phone]  Housing/Transportation Living arrangements for the past 2 months:  Single Family Home Source of Information:  Patient, Other (Comment Required) (sister-in-law) Patient Interpreter Needed:  None Criminal Activity/Legal Involvement Pertinent to Current Situation/Hospitalization:  No - Comment as needed Significant Relationships:  Siblings Lives with:  Self (has private caregivers with home instead) Do you feel safe going back to the place where you live?  No Need for family participation in patient care:  Yes (Comment)  Care giving concerns:  Pt admitted from home where pt has caregivers from [facility]. Initially pt was planned to return home, but pt sister-in-law now at bedside and feels pt needs to get more mobile before returning home.   Social Worker assessment / plan:    CSW initially received notification from [facility] this morning that pt was planned to discharge home with private caregivers and would need ambulance transport to home. CSW was then notified at 3:00 pm that pt sister-in-law arrived and feels pt will be better served at [facility] for rehab.   CSW met with pt and pt sister-in-law, [name] at bedside. CSW introduced self and explained role. Pt sister-in-law discussed that even though pt has private duty care at home, pt is requiring more assist and pt has been having difficulty at home even with caregivers present. Pt sister-in-law discussed that pt has been to [facility] in the past and that would be preference, but agreeable to  [facility] search.   CSW completed FL2 and initiated SNF search to [facility]. CSW contacted [facility] and facility will review pt information and notify CSW if they could accept pt tomorrow.   CSW to continue to follow to provide support and assist with pt disposition needs.    Employment status:  Retired Forensic scientist:  Medicare PT Recommendations:  [place] / Referral to community resources:  [place]  Patient/Family's Response to care:  Pt alert and oriented to self only. Pt pleasantly confused. Pt sister-in-law actively involved and feels pt will be better served at [facility] for rehab. Pt sister-in-law hopeful for [facility].   Patient/Family's Understanding of and Emotional Response to Diagnosis, Current Treatment, and Prognosis:  Pt sister-in-law displayed understanding surrounding pt treatment plan and agreeable to SNF to meet pt post hospital needs.   Emotional Assessment Appearance:  Appears stated age Attitude/Demeanor/Rapport:  Other (appropriate, pleasantly confused) Affect (typically observed):  Appropriate Orientation:  Oriented to Self Alcohol / Substance use:  Not Applicable Psych involvement (Current and /or in the community):  No (Comment)  Discharge Needs  Concerns to be addressed:  Discharge Planning Concerns Readmission within the last 30 days:  No Current discharge risk:  Physical Impairment, Lives alone Barriers to Discharge:  Continued Medical Work up   Gabriella Pier, LCSW 05/22/2015, 3:44 PM  [phone]

## 2015-05-22 NOTE — NC FL2 (Signed)
[place] MEDICAID FL2 LEVEL OF CARE SCREENING TOOL     IDENTIFICATION  Patient Name: [name] Birthdate: 1941/01/14 Sex: female Admission Date (Current Location): 05/19/2015  [facility] and [place] Number:  Producer, television/film/video and Address:  [facility],  [address] [place] [address]      Provider Number: [id number]  Attending Physician Name and Address:  Calvert Cantor, MD  Relative Name and Phone Number:       Current Level of Care: Hospital Recommended Level of Care: Skilled Nursing Facility Prior Approval Number:    Date Approved/Denied:   PASRR Number: [id number]  Discharge Plan: SNF    Current Diagnoses: Patient Active Problem List   Diagnosis Date Noted  . Weakness 05/20/2015  . Failure to thrive in adult 05/20/2015  . HCAP (healthcare-associated pneumonia) 12/12/2013  . Acute respiratory failure with hypoxia (HCC) 12/10/2013  . Closed left hip fracture (HCC) 12/07/2013  . Malnutrition of moderate degree (HCC) 12/07/2013  . Fracture of hip, left, open (HCC) 12/06/2013  . Hilar mass 12/06/2013  . Alcohol abuse 12/06/2013  . Nicotine abuse 12/06/2013  . Cardiomegaly 12/06/2013  . Leukocytosis 12/06/2013  . Impacted cerumen 09/17/2012  . Hypoxemic respiratory failure, chronic (HCC) 08/17/2012  . Chronic hyponatremia 08/17/2012  . UTI (lower urinary tract infection) 08/17/2012  . Back pain 08/17/2012  . COPD (chronic obstructive pulmonary disease) (HCC) 08/17/2012  . HTN (hypertension) 08/17/2012    Orientation RESPIRATION BLADDER Height & Weight     Self  Normal Incontinent Weight: 108 lb 1.6 oz (49.034 kg) Height:   (170.2 cm)  BEHAVIORAL SYMPTOMS/MOOD NEUROLOGICAL BOWEL NUTRITION STATUS   (no behaviors)  (NONE) Incontinent Diet (Diet Heart)  AMBULATORY STATUS COMMUNICATION OF NEEDS Skin   Extensive Assist Verbally Normal                       Personal Care Assistance Level of Assistance  Feeding, Dressing, Bathing  Bathing Assistance: Maximum assistance Feeding assistance: Limited assistance Dressing Assistance: Maximum assistance     Functional Limitations Info  Sight, Hearing, Speech Sight Info: Adequate Hearing Info: Adequate Speech Info: Adequate    SPECIAL CARE FACTORS FREQUENCY  PT (By licensed PT), OT (By licensed OT)     PT Frequency: 5 x a week OT Frequency: 5 x a week            Contractures Contractures Info: Not present    Additional Factors Info  Code Status, Allergies Code Status Info: FULL code status Allergies Info: No Known Allergies           Current Medications (05/22/2015):  This is the current hospital active medication list Current Facility-Administered Medications  Medication Dose Route Frequency Provider Last Rate Last Dose  . 0.9 %  sodium chloride infusion   Intravenous Continuous Calvert Cantor, MD 75 mL/hr at 05/21/15 1337    . acetaminophen (TYLENOL) tablet 650 mg  650 mg Oral Q6H PRN Bobette Mo, MD      . albuterol (PROVENTIL) (2.5 MG/3ML) 0.083% nebulizer solution 2.5 mg  2.5 mg Nebulization Q4H PRN Bobette Mo, MD      . amLODipine (NORVASC) tablet 10 mg  10 mg Oral Daily Calvert Cantor, MD   10 mg at 05/22/15 1033  . cefTRIAXone (ROCEPHIN) 1 g in dextrose 5 % 50 mL IVPB  1 g Intravenous Q24H Lorre Nick, MD 100 mL/hr at 05/21/15 2138 1 g at 05/21/15 2138  . diclofenac sodium (VOLTAREN)  1 % transdermal gel 2 g  2 g Topical QID Calvert Cantor, MD   2 g at 05/22/15 1419  . enoxaparin (LOVENOX) injection 40 mg  40 mg Subcutaneous Q24H Bobette Mo, MD   40 mg at 05/22/15 1030  . feeding supplement (ENSURE ENLIVE) (ENSURE ENLIVE) liquid 237 mL  237 mL Oral BID BM Calvert Cantor, MD   237 mL at 05/22/15 1000  . hydrALAZINE (APRESOLINE) injection 5 mg  5 mg Intravenous Q4H PRN Bobette Mo, MD   5 mg at 05/20/15 0356  . ipratropium-albuterol (DUONEB) 0.5-2.5 (3) MG/3ML nebulizer solution 3 mL  3 mL Nebulization Q4H PRN Calvert Cantor, MD       . metoprolol succinate (TOPROL-XL) 24 hr tablet 25 mg  25 mg Oral BID Bobette Mo, MD   25 mg at 05/22/15 1030  . ondansetron (ZOFRAN) tablet 4 mg  4 mg Oral Q6H PRN Bobette Mo, MD       Or  . ondansetron [facility]) injection 4 mg  4 mg Intravenous Q6H PRN Bobette Mo, MD      . pantoprazole (PROTONIX) EC tablet 40 mg  40 mg Oral Daily Bobette Mo, MD   40 mg at 05/22/15 1030     Discharge Medications: Please see discharge summary for a list of discharge medications.  Relevant Imaging Results:  Relevant Lab Results:   Additional Information SSN: [id number]  KIDD, SUZANNA A, LCSW

## 2015-05-22 NOTE — Consult Note (Addendum)
   [facility] Inpatient Consult   05/22/2015  [name] 05-04-1940 [id number]   Patient was recently assigned to [facility] after ED visit. [facility] RNCM had not contacted patient as she was readmitted to hospital. Spoke with inpatient RNCM.  Went to bedside to speak with patient and sister-in-law, [name]. However, [name] had left prior to writer's bedside visit. Patient was resting. Left [facility] Care Management packet and contact information at bedside for patient's sister in law/HCPOA. Left HIPPA compliant voicemail for [name] to request call back. Will await return call. Made aware that discharge plan is for SNF now.  Addendum: Received call from [name] at [phone]. Writer explained [facility] Care Management program. [name] states she thinks it would be a beneficial program if patient were going home. The plan is for SNF and eventually [name] states she wants to have patient in a long term living situation. [name] reports that her daughter is a Child psychotherapist and she can assist with helping her navigate the system while patient is at [facility]. [name] is agreeable to calling [facility] Care Management if needed in the future. Left packet along with contact information at bedside for [facility] records and to contact [facility] Care Management. Will follow along to ensure that patient goes to SNF prior to requesting closure with [facility] Care Management office.   Will make inpatient RNCM aware.    Raiford Noble, MSN-Ed, RN,BSN [facility] Liaison [phone]

## 2015-05-22 NOTE — Progress Notes (Signed)
[facility]  Patient Status: Active (receiving services up to time of hospitalization)  [facility] is providing the following services: RN and PT  If patient discharges after hours, please call [phone].   [name] 05/22/2015, 11:40 AM

## 2015-05-22 NOTE — Care Management Note (Signed)
Case Management Note  Patient Details  Name: [name] MRN: [id number] Date of Birth: 08/24/1940  Subjective/Objective:   Spoke to patient/sister in law([name]) in rm about d/c plans. Patient agrees to SNF. CSW notified.THN also following.                 Action/Plan:d/c plan SNF.   Expected Discharge Date:                Expected Discharge Plan:  Skilled Nursing Facility  In-House Referral:  Clinical Social Work  Discharge planning Services  CM Consult  Post Acute Care Choice:  Resumption of Svcs/PTA Provider [facility]) Choice offered to:     DME Arranged:    DME Agency:     HH Arranged:    HH Agency:     Status of Service:  In process, will continue to follow  Medicare Important Message Given:  Yes Date Medicare IM Given:    Medicare IM give by:    Date Additional Medicare IM Given:    Additional Medicare Important Message give by:     If discussed at Long Length of Stay Meetings, dates discussed:    Additional Comments:  Lanier Clam, RN 05/22/2015, 2:47 PM

## 2015-05-22 NOTE — Plan of Care (Signed)
Problem: Skin Integrity: Goal: Risk for impaired skin integrity will decrease Outcome: Progressing Applying EPBC to reddened areas from incontinence

## 2015-05-22 NOTE — Plan of Care (Signed)
Problem: Education: Goal: Knowledge of [facility] Education information/materials will improve Outcome: Not Progressing Pt remains intermittently confused

## 2015-05-22 NOTE — Care Management Important Message (Signed)
Important Message  Patient Details  Name:[name] MRN: [id number] Date of Birth: November 22, 1940   Medicare Important Message Given:  Yes    Haskell Flirt 05/22/2015, 12:21 PMImportant Message  Patient Details  Name: [name] MRN: [id number] Date of Birth: 08-21-1940   Medicare Important Message Given:  Yes    Haskell Flirt 05/22/2015, 12:21 PM

## 2015-05-22 NOTE — Progress Notes (Signed)
MD notified with orthostatic vital signs.

## 2015-05-22 NOTE — Care Management Note (Signed)
Case Management Note  Patient Details  Name: [name] MRN: [id number] Date of Birth: [birth year]  Subjective/Objective: Spoke to patient in rm while [facility] in rm also-patient's states d/c plan home.[facility] already active w/RN/PT-rep Clydie Braun aware-await HHRN/PT order,[id number].. [facility] person asked about a w/c .  She states patient had a w/c in 2015-it was a rental & rental has been taken back.  Informed [facility] dme rep Mayra Reel about w/c @ d/c. Await home w/c order.  Patient will d/c home by ambulance.  Left message w/HCPOA [name] tel#[phone] return call to confirm d/c plan.                   Action/Plan:d/c plan home w/HHC/DME.   Expected Discharge Date:                 Expected Discharge Plan:  Home w Home Health Services  In-House Referral:     Discharge planning Services     Post Acute Care Choice:  Resumption of Svcs/PTA Provider [facility]) Choice offered to:     DME Arranged:    DME Agency:     HH Arranged:    HH Agency:     Status of Service:  In process, will continue to follow  Medicare Important Message Given:    Date Medicare IM Given:    Medicare IM give by:    Date Additional Medicare IM Given:    Additional Medicare Important Message give by:     If discussed at Long Length of Stay Meetings, dates discussed:    Additional Comments:  Lanier Clam, RN 05/22/2015, 12:05 PM

## 2015-05-22 NOTE — Progress Notes (Signed)
[facility] Progress Note   MCKINZEE SPIRITO  HYQ:[id number]  DOB: 24-Jun-1940  DOA: 05/19/2015 PCP: Gweneth Dimitri, MD  Brief narrative: [name] is a 75 y.o. female with hypertension, COPD, ongoing tobacco abuse, chronic back pain who presents to the hospital for weakness and difficulty with standing and ambulating. Symptoms have been progressive over the past 2 weeks. This is her fourth visit to the ER since the 2/22 with the same complaints of generalized weakness and frequent falls. She is found to have acute on chronic hyponatremia with a sodium of 128 and a UTI.   Subjective: Continued to be confused today. Sill no complaints of focal weakness, cough, dyspnea, nausea, vomiting or pain.  Assessment/Plan: Principal Problem:   UTI (lower urinary tract infection) -cultures growing E coli sensitive to Ceftriaxone- she has received 3 days of treatment therefore will stop ceftriaxone  Active Problems:   Acute on Chronic hyponatremia - orthostatics + today -Possibly secondary to poor oral intake - continue IV fluids for now- give 1000 cc bolus, increase rate and repeat orthostatics tomorrow  Dementia? - remains confused to time and place  Pain in left upper medial thigh - may be due to one of her falls-  - start Voltaren gel    COPD (chronic obstructive pulmonary disease) -This is stable    HTN (hypertension) -Continue metoprolol      Alcohol abuse/  Nicotine abuse    Malnutrition of moderate degree -Start dietary supplements    Weakness/frequent falls - PT recommends SNF vs 24 care at home- will go to SNF   Antibiotics: Anti-infectives    Start     Dose/Rate Route Frequency Ordered Stop   05/19/15 2200  cefTRIAXone (ROCEPHIN) 1 g in dextrose 5 % 50 mL IVPB     1 g 100 mL/hr over 30 Minutes Intravenous Every 24 hours 05/19/15 2156       Code Status:     Code Status Orders        Start     Ordered   05/20/15 0707  Full code   Continuous      05/20/15 0706    Code Status History    Date Active Date Inactive Code Status Order ID Comments User Context   12/08/2013  2:38 PM 12/12/2013  4:28 PM Full Code [id number]  Javier Docker, MD Inpatient   08/17/2012  6:10 PM 08/23/2012  6:36 PM Full Code [id number]  Laveda Norman, MD Inpatient     Family Communication:  Disposition Plan: Hopefully will be ready to discharge by tomorrow for SNF DVT prophylaxis: Lovenox Consultants:  Procedures:     Objective: Filed Weights   05/20/15 0100  Weight: 49.034 kg (108 lb 1.6 oz)    Intake/Output Summary (Last 24 hours) at 05/22/15 1734 Last data filed at 05/22/15 1417  Gross per 24 hour  Intake   1080 ml  Output      0 ml  Net   1080 ml     Vitals Filed Vitals:   05/22/15 0606 05/22/15 0724 05/22/15 1030 05/22/15 1245  BP: 169/80 155/88 119/87 113/67  Pulse: 70  78 80  Temp: 98.2 F (36.8 C)   98.4 F (36.9 C)  TempSrc: Oral   Oral  Resp: 16   18  Height:      Weight:      SpO2: 97%   94%    Exam:  General:  Pt is alert, Confused to time and place, not in acute distress  HEENT: No icterus, No thrush, oral mucosa moist  Cardiovascular: regular rate and rhythm, S1/S2 No murmur  Respiratory: clear to auscultation bilaterally   Abdomen: Soft, +Bowel sounds, non tender, non distended, no guarding  MSK: No cyanosis or clubbing- no pedal edema- tenderness in left upper medial thigh   Data Reviewed: Basic Metabolic Panel:  Recent Labs Lab 05/16/15 1140 05/18/15 0100 05/19/15 2055 05/20/15 0804 05/21/15 0404 05/22/15 0333  NA 132* 128* 130*  --  132* 133*  K 4.2 4.2 4.0  --  3.5 4.0  CL 95* 95* 97*  --  97* 98*  CO2 --  26 27  GLUCOSE 73 95 98  --  95 103*  BUN --  8 11  CREATININE 0.62 0.65 0.62 0.54 0.53 0.51  CALCIUM 9.5 9.0 9.1  --  8.6* 9.2  MG  --   --   --  1.9  --   --   PHOS  --   --   --  2.5  --   --    Liver Function Tests:  Recent Labs Lab 05/21/15 0404  AST 20  ALT 16   ALKPHOS 57  BILITOT 0.6  PROT 5.9*  ALBUMIN 3.1*   No results for input(s): LIPASE, AMYLASE in the last 168 hours. No results for input(s): AMMONIA in the last 168 hours. CBC:  Recent Labs Lab 05/16/15 1140 05/18/15 0100 05/19/15 2055 05/20/15 0804 05/21/15 0404  WBC 8.1 10.2 9.6 9.1 7.9  NEUTROABS 5.9 7.4 6.8  --  5.4  HGB 15.8* 14.9 15.3* 15.1* 13.4  HCT 47.7* 43.4 45.7 44.5 41.5  MCV 87.2 84.3 86.6 85.9 86.6  PLT 404* 402* 409* 415* 389   Cardiac Enzymes:  Recent Labs Lab 05/18/15 0100  TROPONINI <0.03   BNP (last 3 results) No results for input(s): BNP in the last 8760 hours.  ProBNP (last 3 results) No results for input(s): PROBNP in the last 8760 hours.  CBG: No results for input(s): GLUCAP in the last 168 hours.  Recent Results (from the past 240 hour(s))  Urine culture     Status: None   Collection Time: 05/19/15  9:15 PM  Result Value Ref Range Status   Specimen Description URINE, CATHETERIZED  Final   Special Requests NONE  Final   Culture   Final    >=100,000 COLONIES/mL ESCHERICHIA COLI Performed at [facility]    Report Status 05/22/2015 FINAL  Final   Organism ID, Bacteria ESCHERICHIA COLI  Final      Susceptibility   Escherichia coli - MIC*    AMPICILLIN 4 SENSITIVE Sensitive     CEFAZOLIN <=4 SENSITIVE Sensitive     CEFTRIAXONE <=1 SENSITIVE Sensitive     CIPROFLOXACIN <=0.25 SENSITIVE Sensitive     GENTAMICIN <=1 SENSITIVE Sensitive     IMIPENEM <=0.25 SENSITIVE Sensitive     NITROFURANTOIN <=16 SENSITIVE Sensitive     TRIMETH/SULFA <=20 SENSITIVE Sensitive     AMPICILLIN/SULBACTAM <=2 SENSITIVE Sensitive     PIP/TAZO <=4 SENSITIVE Sensitive     * >=100,000 COLONIES/mL ESCHERICHIA COLI     Studies: No results found.  Scheduled Meds:  Scheduled Meds: . amLODipine  10 mg Oral Daily  . cefTRIAXone (ROCEPHIN)  IV  1 g Intravenous Q24H  . diclofenac sodium  2 g Topical QID  . enoxaparin (LOVENOX) injection  40 mg  Subcutaneous Q24H  . feeding supplement (ENSURE ENLIVE)  237 mL Oral BID  BM  . metoprolol succinate  25 mg Oral BID  . pantoprazole  40 mg Oral Daily   Continuous Infusions: . sodium chloride 75 mL/hr at 05/21/15 1337    Time spent on care of this patient: 35 min   Kellyjo Edgren, MD 05/22/2015, 5:34 PM  LOS: 3 days   [facility] Office  [phone] Pager - Text Page per [email or web address] If 7PM-7AM, please contact night-coverage [email or web address]

## 2015-05-22 NOTE — Clinical Social Work Placement (Signed)
   CLINICAL SOCIAL WORK PLACEMENT  NOTE  Date:  05/22/2015  Patient Details  Name: [name] MRN: [id number] Date of Birth: 04/24/1940  Clinical Social Work is seeking post-discharge placement for this patient at the Skilled  Nursing Facility level of care (*CSW will initial, date and re-position this form in  chart as items are completed):  Yes   Patient/family provided with [place] Clinical Social Work Department's list of facilities offering this level of care within the geographic area requested by the patient (or if unable, by the patient's family).  Yes   Patient/family informed of their freedom to choose among providers that offer the needed level of care, that participate in Medicare, Medicaid or managed care program needed by the patient, have an available bed and are willing to accept the patient.  Yes   Patient/family informed of [facility]'s ownership interest in [facility] and [facility], as well as of the fact that they are under no obligation to receive care at these facilities.  PASRR submitted to EDS on       PASRR number received on       Existing PASRR number confirmed on 05/22/15     FL2 transmitted to all facilities in geographic area requested by pt/family on 05/22/15     FL2 transmitted to all facilities within larger geographic area on       Patient informed that his/her managed care company has contracts with or will negotiate with certain facilities, including the following:            Patient/family informed of bed offers received.  Patient chooses bed at       Physician recommends and patient chooses bed at      Patient to be transferred to   on  .  Patient to be transferred to facility by       Patient family notified on   of transfer.  Name of family member notified:        PHYSICIAN Please sign FL2     Additional Comment:    _______________________________________________ Orson Eva, LCSW 05/22/2015, 4:02 PM

## 2015-05-23 DIAGNOSIS — I951 Orthostatic hypotension: Secondary | ICD-10-CM

## 2015-05-23 LAB — BASIC METABOLIC PANEL
ANION GAP: 6 (ref 5–15)
BUN: 10 mg/dL (ref 4–21)
BUN: 10 mg/dL (ref 6–20)
CO2: 27 mmol/L (ref 22–32)
Calcium: 8.5 mg/dL — ABNORMAL LOW (ref 8.9–10.3)
Chloride: 97 mmol/L — ABNORMAL LOW (ref 101–111)
Creatinine, Ser: 0.44 mg/dL (ref 0.44–1.00)
Creatinine: 0.4 mg/dL — AB (ref 0.5–1.1)
GFR calc Af Amer: >60 mL/min (ref >60)
GFR calc non Af Amer: >60 mL/min (ref >60)
GLUCOSE: 100 mg/dL
GLUCOSE: 100 mg/dL — AB (ref 65–99)
POTASSIUM: 4 mmol/L (ref 3.5–5.1)
SODIUM: 130 mmol/L — AB (ref 137–147)
Sodium: 130 mmol/L — ABNORMAL LOW (ref 135–145)

## 2015-05-23 MED ORDER — AMOXICILLIN 500 MG PO CAPS: 500.0000 mg | ORAL_CAPSULE | Freq: Three times a day (TID) | ORAL | Status: DC

## 2015-05-23 MED ORDER — AMOXICILLIN 250 MG PO CAPS
500.0000 mg | ORAL_CAPSULE | Freq: Three times a day (TID) | ORAL | Status: DC
  Administered 2015-05-23: 500 mg via ORAL
  Filled 2015-05-23: qty 2

## 2015-05-23 MED ORDER — DICLOFENAC SODIUM 1 % TD GEL: 2.0000 g | Freq: Four times a day (QID) | TRANSDERMAL | Status: AC

## 2015-05-23 MED ORDER — AMLODIPINE BESYLATE 10 MG PO TABS: 10.0000 mg | ORAL_TABLET | Freq: Every day | ORAL | Status: AC

## 2015-05-23 MED ORDER — ENSURE ENLIVE PO LIQD: 237.0000 mL | Freq: Two times a day (BID) | ORAL | Status: DC

## 2015-05-23 NOTE — Care Management Note (Signed)
Case Management Note  Patient Details  Name: [name] MRN: [id number] Date of Birth: 1940/06/15  Subjective/Objective:                    Action/Plan:dc SNF.   Expected Discharge Date:                 Expected Discharge Plan:  Skilled Nursing Facility  In-House Referral:  Clinical Social Work  Discharge planning Services  CM Consult  Post Acute Care Choice:  Resumption of Svcs/PTA Provider [facility]) Choice offered to:     DME Arranged:    DME Agency:     HH Arranged:    HH Agency:     Status of Service:  Completed, signed off  Medicare Important Message Given:  Yes Date Medicare IM Given:    Medicare IM give by:    Date Additional Medicare IM Given:    Additional Medicare Important Message give by:     If discussed at Long Length of Stay Meetings, dates discussed:    Additional Comments:  Lanier Clam, RN 05/23/2015, 10:46 AM

## 2015-05-23 NOTE — Progress Notes (Signed)
Patient d/c to [facility] via non emergency ambulance, patient is stable. Denies pain

## 2015-05-23 NOTE — Clinical Social Work Placement (Signed)
   CLINICAL SOCIAL WORK PLACEMENT  NOTE  Date:  05/23/2015  Patient Details  Name: [name] MRN: [id number] Date of Birth: [birth year]  Clinical Social Work is seeking post-discharge placement for this patient at the Skilled  Nursing Facility level of care (*CSW will initial, date and re-position this form in  chart as items are completed):  Yes   Patient/family provided with [facility] Clinical Social Work Department's list of facilities offering this level of care within the geographic area requested by the patient (or if unable, by the patient's family).  Yes   Patient/family informed of their freedom to choose among providers that offer the needed level of care, that participate in Medicare, Medicaid or managed care program needed by the patient, have an available bed and are willing to accept the patient.  Yes   Patient/family informed of [place]'s ownership interest in [facility] and [facility], as well as of the fact that they are under no obligation to receive care at these facilities.  PASRR submitted to EDS on       PASRR number received on       Existing PASRR number confirmed on 05/22/15     FL2 transmitted to all facilities in geographic area requested by pt/family on 05/22/15     FL2 transmitted to all facilities within larger geographic area on       Patient informed that his/her managed care company has contracts with or will negotiate with certain facilities, including the following:        Yes   Patient/family informed of bed offers received.  Patient chooses bed at [facility]     Physician recommends and patient chooses bed at      Patient to be transferred to [facility] on 05/23/15.  Patient to be transferred to facility by ambulance Sharin Mons)     Patient family notified on 05/23/15 of transfer.  Name of family member notified:  pt notified at bedside and pt sister-in-law, [name] via telephone     PHYSICIAN Please sign FL2      Additional Comment:    _______________________________________________ Orson Eva, LCSW 05/23/2015, 1:52 PM

## 2015-05-23 NOTE — Progress Notes (Signed)
Pt for discharge to [facility].   CSW facilitated pt discharge needs including contacting facility, faxing pt discharge information via epic hub, discussing with pt at bedside and notifying pt sister-in-law via telephone, providing RN phone number to call report, and arranging ambulance transport via [facility] for pt to [facility].   No further social work needs identified at this time.  CSW signing off.   Loletta Specter, MSW, LCSW Clinical Social Work [phone]

## 2015-05-23 NOTE — Progress Notes (Signed)
Report given to [facility] nurse at [facility].

## 2015-05-23 NOTE — Discharge Summary (Addendum)
Physician Discharge Summary  [name]:[id number] DOB: 02-04-1941 DOA: 05/19/2015  PCP: Gabriella Dimitri, MD  Admit date: 05/19/2015 Discharge date: 05/23/2015  Recommendations for Outpatient Follow-up:  1. Pt will need to follow up with PCP in 2 weeks post discharge 2. Please obtain BMP in one week Discharge Diagnoses:  Generalized weakness -due to UTI and orthostatic hypotension -improved but remained deconditioned -PT evaluated and recommended SNF with which pt agreed   UTI (lower urinary tract infection) -cultures growing E coli sensitive to Ceftriaxone- she has received 3 days of treatment  -discharge with amoxil x 4 additional days to complete 7 days of tx   Acute on Chronic hyponatremia - orthostatics + on 05/22/15 -pt started on IVF and repeat orthostatics on day of discharge negative -Possibly secondary to poor oral intake  -Na chronically ranges 126-130  Cognitive impairement - remains confused to time and place intermittenly  Pain in left upper medial thigh - may be due to one of her falls- -improved on voltaren gel   COPD (chronic obstructive pulmonary disease) -This is stable   HTN (hypertension) -Continue metoprolol succinate and amlodipine   Alcohol abuse/ Nicotine abuse   Malnutrition of moderate degree -Start dietary supplements   Weakness/frequent falls - PT recommends SNF vs 24 care at home- will go to SNF  Discharge Condition: stable  Disposition: SNF  Diet:heart healthy Wt Readings from Last 3 Encounters:  05/20/15 49.034 kg (108 lb 1.6 oz)  12/26/13 50.168 kg (110 lb 9.6 oz)  12/07/13 48.9 kg (107 lb 12.9 oz)    History of present illness:  [name] is a 75 y.o. female with hypertension, COPD, ongoing tobacco abuse, chronic back pain who presents to the hospital for weakness and difficulty with standing and ambulating. Symptoms have been progressive over the past 2 weeks. This is her fourth visit to the ER since the 2/22  with the same complaints of generalized weakness and frequent falls. She is found to have acute on chronic hyponatremia with a sodium of 128 and a UTI.  Discharge Exam: Filed Vitals:   05/23/15 0531 05/23/15 0945  BP: 140/60 140/60  Pulse: 76   Temp:    Resp:     Filed Vitals:   05/22/15 2247 05/23/15 0529 05/23/15 0531 05/23/15 0945  BP: 162/90 150/76 140/60 140/60  Pulse: 85 74 76   Temp: 97.7 F (36.5 C) 98 F (36.7 C)    TempSrc: Oral Oral    Resp: 18 18    Height:      Weight:      SpO2: 96% 94%     General: Awake and alert, NAD, pleasant, cooperative Cardiovascular: RRR, no rub, no gallop, no S3 Respiratory: CTAB, no wheeze, no rhonchi Abdomen:soft, nontender, nondistended, positive bowel sounds Extremities: No edema, No lymphangitis, no petechiae  Discharge Instructions      Discharge Instructions    Diet - low sodium heart healthy    Complete by:  As directed      Increase activity slowly    Complete by:  As directed             Medication List    STOP taking these medications        metoprolol tartrate 25 MG tablet  Commonly known as:  LOPRESSOR     tiotropium 18 MCG inhalation capsule  Commonly known as:  SPIRIVA HANDIHALER      TAKE these medications        acetaminophen 500 MG tablet  Commonly known as:  TYLENOL  Take 2 tablets (1,000 mg total) by mouth every 6 (six) hours as needed for moderate pain.     amLODipine 10 MG tablet  Commonly known as:  NORVASC  Take 1 tablet (10 mg total) by mouth daily.     amoxicillin 500 MG capsule  Commonly known as:  AMOXIL  Take 1 capsule (500 mg total) by mouth every 8 (eight) hours.     diclofenac sodium 1 % Gel  Commonly known as:  VOLTAREN  Apply 2 g topically 4 (four) times daily.     feeding supplement (ENSURE ENLIVE) Liqd  Take 237 mLs by mouth 2 (two) times daily between meals.     metoprolol succinate 25 MG 24 hr tablet  Commonly known as:  TOPROL XL  Take 1 tablet (25 mg total) by  mouth 2 (two) times daily.         The results of significant diagnostics from this hospitalization (including imaging, microbiology, ancillary and laboratory) are listed below for reference.    Significant Diagnostic Studies: Dg Chest 2 View  05/19/2015  CLINICAL DATA:  Cough EXAM: CHEST  2 VIEW COMPARISON:  05/16/2015 FINDINGS: Cardiac shadow is stable. The mitral annular calcifications are again seen. The lungs are clear. Pressure deformities are noted within thoracic spine stable from the previous exam. No acute bony abnormality is noted. IMPRESSION: No active cardiopulmonary disease. Electronically Signed   By: Alcide Clever M.D.   On: 05/19/2015 20:40   Dg Chest 2 View  05/16/2015  CLINICAL DATA:  Larey Seat yesterday.  Hypertension and COPD. EXAM: CHEST  2 VIEW COMPARISON:  12/10/2013 FINDINGS: Chronic left ventricular prominence. Atherosclerosis of the aorta. The lungs are clear. No effusions. There is an old augmented fracture at T6. Partial compression fractures are seen above that an below that at T7 and T10. Old healed rib fractures on the left as seen previously. IMPRESSION: No acute finding. Left ventricular prominence. Atherosclerosis of the aorta. Old thoracic fractures and old rib fractures. Electronically Signed   By: Paulina Fusi M.D.   On: 05/16/2015 11:40   Ct Head Wo Contrast  05/18/2015  CLINICAL DATA:  Lost balance and fell, confusion. EXAM: CT HEAD WITHOUT CONTRAST TECHNIQUE: Contiguous axial images were obtained from the base of the skull through the vertex without intravenous contrast. COMPARISON:  Head CT 01/06/2011 FINDINGS: No intracranial hemorrhage, mass effect, or midline shift. Generalized atrophy is unchanged. Chronic small vessel ischemia, has progressed in the interim. No hydrocephalus. The basilar cisterns are patent. No evidence of territorial infarct. Remote lacunar infarct in the right thalamus, unchanged. No intracranial fluid collection. Calvarium is intact.  Included paranasal sinuses and mastoid air cells are well aerated. IMPRESSION: 1.  No acute intracranial abnormality. 2. Progressive chronic small vessel ischemic change from exam 4.5 years prior. Atrophy is grossly stable. Electronically Signed   By: Rubye Oaks M.D.   On: 05/18/2015 04:03   Dg Hip Unilat With Pelvis 2-3 Views Left  05/16/2015  CLINICAL DATA:  Left posterior hip pain after a fall. Slipped in the bathroom. 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 Other bones of the pelvis are unremarkable. IMPRESSION: No acute or traumatic finding. 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 Electronically Signed   By: Paulina Fusi M.D.   On: 05/16/2015 11:41     Microbiology: Recent Results (from the past 240 hour(s))  Urine culture     Status: None   Collection Time: 05/19/15  9:15 PM  Result Value Ref Range Status   Specimen Description URINE, CATHETERIZED  Final   Special Requests NONE  Final   Culture   Final    >=100,000 COLONIES/mL ESCHERICHIA COLI Performed at [facility]    Report Status 05/22/2015 FINAL  Final   Organism ID, Bacteria ESCHERICHIA COLI  Final      Susceptibility   Escherichia coli - MIC*    AMPICILLIN 4 SENSITIVE Sensitive     CEFAZOLIN <=4 SENSITIVE Sensitive     CEFTRIAXONE <=1 SENSITIVE Sensitive     CIPROFLOXACIN <=0.25 SENSITIVE Sensitive     GENTAMICIN <=1 SENSITIVE Sensitive     IMIPENEM <=0.25 SENSITIVE Sensitive     NITROFURANTOIN <=16 SENSITIVE Sensitive     TRIMETH/SULFA <=20 SENSITIVE Sensitive     AMPICILLIN/SULBACTAM <=2 SENSITIVE Sensitive     PIP/TAZO <=4 SENSITIVE Sensitive     * >=100,000 COLONIES/mL ESCHERICHIA COLI     Labs: Basic Metabolic Panel:  Recent Labs Lab 05/18/15 0100 05/19/15 2055 05/20/15 0804 05/21/15 0404 05/22/15 0333 05/23/15 0336  NA 128* 130*  --  132* 133* 130*  K 4.2 4.0  --  3.5  4.0 4.0  CL 95* 97*  --  97* 98* 97*  CO2 24 23  --  26 27 27   GLUCOSE 95 98  --  95 103* 100*  BUN 13 11  --  8 11 10   CREATININE 0.65 0.62 0.54 0.53 0.51 0.44  CALCIUM 9.0 9.1  --  8.6* 9.2 8.5*  MG  --   --  1.9  --   --   --   PHOS  --   --  2.5  --   --   --    Liver Function Tests:  Recent Labs Lab 05/21/15 0404  AST 20  ALT 16  ALKPHOS 57  BILITOT 0.6  PROT 5.9*  ALBUMIN 3.1*   No results for input(s): LIPASE, AMYLASE in the last 168 hours. No results for input(s): AMMONIA in the last 168 hours. CBC:  Recent Labs Lab 05/16/15 1140 05/18/15 0100 05/19/15 2055 05/20/15 0804 05/21/15 0404  WBC 8.1 10.2 9.6 9.1 7.9  NEUTROABS 5.9 7.4 6.8  --  5.4  HGB 15.8* 14.9 15.3* 15.1* 13.4  HCT 47.7* 43.4 45.7 44.5 41.5  MCV 87.2 84.3 86.6 85.9 86.6  PLT 404* 402* 409* 415* 389   Cardiac Enzymes:  Recent Labs Lab 05/18/15 0100  TROPONINI <0.03   BNP: Invalid input(s): POCBNP CBG: No results for input(s): GLUCAP in the last 168 hours.  Time coordinating discharge:  Greater than 30 minutes  Signed:  Glennie Rodda, DO [facility] Pager: [phone] 05/23/2015, 9:58 AM

## 2015-05-23 NOTE — NC FL2 (Signed)
[place] MEDICAID FL2 LEVEL OF CARE SCREENING TOOL     IDENTIFICATION  Patient Name: [name] Birthdate: [birth year] Sex: female Admission Date (Current Location): 05/19/2015  [facility] and [place] Number:  Producer, television/film/video and Address:  [facility],  [address] [place] [address]      Provider Number: [id number]  Attending Physician Name and Address:  Catarina Hartshorn, MD  Relative Name and Phone Number:       Current Level of Care: Hospital Recommended Level of Care: Skilled Nursing Facility Prior Approval Number:    Date Approved/Denied:   PASRR Number: [id number]  Discharge Plan: SNF    Current Diagnoses: Patient Active Problem List   Diagnosis Date Noted  . Orthostatic hypotension 05/23/2015  . Weakness 05/20/2015  . Failure to thrive in adult 05/20/2015  . HCAP (healthcare-associated pneumonia) 12/12/2013  . Acute respiratory failure with hypoxia (HCC) 12/10/2013  . Closed left hip fracture (HCC) 12/07/2013  . Malnutrition of moderate degree (HCC) 12/07/2013  . Fracture of hip, left, open (HCC) 12/06/2013  . Hilar mass 12/06/2013  . Alcohol abuse 12/06/2013  . Nicotine abuse 12/06/2013  . Cardiomegaly 12/06/2013  . Leukocytosis 12/06/2013  . Impacted cerumen 09/17/2012  . Hypoxemic respiratory failure, chronic (HCC) 08/17/2012  . Chronic hyponatremia 08/17/2012  . UTI (lower urinary tract infection) 08/17/2012  . Back pain 08/17/2012  . COPD (chronic obstructive pulmonary disease) (HCC) 08/17/2012  . HTN (hypertension) 08/17/2012    Orientation RESPIRATION BLADDER Height & Weight     Self  Normal Incontinent Weight: 108 lb 1.6 oz (49.034 kg) Height:   (170.2 cm)  BEHAVIORAL SYMPTOMS/MOOD NEUROLOGICAL BOWEL NUTRITION STATUS   (no behaviors)  (NONE) Incontinent Diet (Diet Heart)  AMBULATORY STATUS COMMUNICATION OF NEEDS Skin   Extensive Assist Verbally Normal                       Personal Care Assistance Level of  Assistance  Feeding, Dressing, Bathing Bathing Assistance: Maximum assistance Feeding assistance: Limited assistance Dressing Assistance: Maximum assistance     Functional Limitations Info  Sight, Hearing, Speech Sight Info: Adequate Hearing Info: Adequate Speech Info: Adequate    SPECIAL CARE FACTORS FREQUENCY  PT (By licensed PT), OT (By licensed OT)     PT Frequency: 5 x a week OT Frequency: 5 x a week            Contractures Contractures Info: Not present    Additional Factors Info  Code Status, Allergies Code Status Info: FULL code status Allergies Info: No Known Allergies           Current Medications (05/23/2015):  This is the current hospital active medication list Current Facility-Administered Medications  Medication Dose Route Frequency Provider Last Rate Last Dose  . acetaminophen (TYLENOL) tablet 650 mg  650 mg Oral Q6H PRN Bobette Mo, MD   650 mg at 05/22/15 2042  . albuterol (PROVENTIL) (2.5 MG/3ML) 0.083% nebulizer solution 2.5 mg  2.5 mg Nebulization Q4H PRN Bobette Mo, MD      . amLODipine (NORVASC) tablet 10 mg  10 mg Oral Daily Calvert Cantor, MD   10 mg at 05/23/15 0945  . amoxicillin (AMOXIL) capsule 500 mg  500 mg Oral 3 times per day Catarina Hartshorn, MD      . diclofenac sodium (VOLTAREN) 1 % transdermal gel 2 g  2 g Topical QID Calvert Cantor, MD   2 g at 05/23/15 0945  .  enoxaparin (LOVENOX) injection 40 mg  40 mg Subcutaneous Q24H Bobette Mo, MD   40 mg at 05/23/15 0945  . feeding supplement (ENSURE ENLIVE) (ENSURE ENLIVE) liquid 237 mL  237 mL Oral BID BM Saima Rizwan, MD   237 mL at 05/23/15 0945  . hydrALAZINE (APRESOLINE) injection 5 mg  5 mg Intravenous Q4H PRN Bobette Mo, MD   5 mg at 05/20/15 0356  . ipratropium-albuterol (DUONEB) 0.5-2.5 (3) MG/3ML nebulizer solution 3 mL  3 mL Nebulization Q4H PRN Calvert Cantor, MD      . metoprolol succinate (TOPROL-XL) 24 hr tablet 25 mg  25 mg Oral BID Bobette Mo, MD   25  mg at 05/23/15 0945  . ondansetron (ZOFRAN) tablet 4 mg  4 mg Oral Q6H PRN Bobette Mo, MD       Or  . ondansetron [facility]) injection 4 mg  4 mg Intravenous Q6H PRN Bobette Mo, MD      . pantoprazole (PROTONIX) EC tablet 40 mg  40 mg Oral Daily Bobette Mo, MD   40 mg at 05/23/15 0945     Discharge Medications: Please see discharge summary for a list of discharge medications.  Relevant Imaging Results:  Relevant Lab Results:   Additional Information SSN: [id number]  KIDD, SUZANNA A, LCSW

## 2015-05-23 NOTE — Progress Notes (Signed)
CSW continuing to follow.  CSW confirmed with [facility] that facility could accept pt today. CSW confirmed with pt sister-in-law, [name] that pt family in agreement to transition to [facility] today.   MD confirmed pt medically stable for discharge to [facility] today.  Per [employer], pt sister-in-law to go to [facility] at 2 pm to complete paperwork and [employer] will notify CSW once pt sister-in-law arrives for CSW to arrange ambulance transport.  CSW to facilitate pt discharge needs this afternoon.  Loletta Specter, MSW, LCSW Clinical Social Work [phone]

## 2015-05-24 ENCOUNTER — Non-Acute Institutional Stay (SKILLED_NURSING_FACILITY): Payer: Medicare Other | Admitting: Adult Health

## 2015-05-24 ENCOUNTER — Encounter: Payer: Self-pay | Admitting: Adult Health

## 2015-05-24 DIAGNOSIS — E871 Hypo-osmolality and hyponatremia: Secondary | ICD-10-CM

## 2015-05-24 DIAGNOSIS — M79605 Pain in left leg: Secondary | ICD-10-CM

## 2015-05-24 DIAGNOSIS — E46 Unspecified protein-calorie malnutrition: Secondary | ICD-10-CM

## 2015-05-24 DIAGNOSIS — F1910 Other psychoactive substance abuse, uncomplicated: Secondary | ICD-10-CM | POA: Diagnosis not present

## 2015-05-24 DIAGNOSIS — R4189 Other symptoms and signs involving cognitive functions and awareness: Secondary | ICD-10-CM

## 2015-05-24 DIAGNOSIS — J438 Other emphysema: Secondary | ICD-10-CM | POA: Diagnosis not present

## 2015-05-24 DIAGNOSIS — N390 Urinary tract infection, site not specified: Secondary | ICD-10-CM

## 2015-05-24 DIAGNOSIS — I10 Essential (primary) hypertension: Secondary | ICD-10-CM

## 2015-05-24 DIAGNOSIS — R531 Weakness: Secondary | ICD-10-CM | POA: Diagnosis not present

## 2015-05-24 DIAGNOSIS — Z720 Tobacco use: Secondary | ICD-10-CM

## 2015-05-24 NOTE — Progress Notes (Signed)
Patient ID: [name], female   DOB: January 05, 1941, 75 y.o.   MRN: [id number]    DATE:  05/24/2015   MRN:  [id number]  BIRTHDAY: 13-Mar-1941  Facility:  Nursing Home Location:  [facility]  Nursing Home Room Number: [id number]  LEVEL OF CARE:  SNF [phone])  Contact Information    Name Relation Home Work Mobile   Hewett,[name] Other [phone]  [phone]   Parke Poisson [phone]     Jasmine Awe [phone]        Code Status History    Date Active Date Inactive Code Status Order ID Comments User Context   05/20/2015  7:06 AM 05/23/2015  5:07 PM Full Code [id number]  Bobette Mo, MD Inpatient   12/08/2013  2:38 PM 12/12/2013  4:28 PM Full Code [id number]  Javier Docker, MD Inpatient   08/17/2012  6:10 PM 08/23/2012  6:36 PM Full Code [id number]  Laveda Norman, MD Inpatient    Advance Directive Documentation        Most Recent Value   Type of Advance Directive  Out of facility DNR (pink MOST or yellow form)   Pre-existing out of facility DNR order (yellow form or pink MOST form)     "MOST" Form in Place?         Chief Complaint  Patient presents with  . Hospitalization Follow-up    HISTORY OF PRESENT ILLNESS:  This is a 75 year old female who has been admitted to [facility] on 05/23/15 from [facility]. She has PMH of hypertension, COPD, ongoing tobacco abuse and chronic back pain. She verbalized that she only smokes occasionally and does not want to be on nicotine patch.She was having weakness and difficulty with standing and ambulating. She was found to have acute and chronic hyponatremia with sodium of 128 and a UTI. She has received 3 days of ceftriaxone and given IV fluids. She has been admitted for a short-term rehabilitation.    PAST MEDICAL HISTORY:  Past Medical History  Diagnosis Date  . Back pain   . Hypertension   . COPD (chronic obstructive pulmonary disease) (HCC)   . Anxiety   . Hyperlipemia   .  Compression fracture   . Hypercalcemia   . UTI (lower urinary tract infection)   . Generalized weakness   . Hyponatremia   . Cognitive impairment   . Alcohol abuse   . Nicotine abuse   . Frequent falls      CURRENT MEDICATIONS: Reviewed  Patient's Medications  New Prescriptions   No medications on file  Previous Medications   ACETAMINOPHEN (TYLENOL) 500 MG TABLET    Take 2 tablets (1,000 mg total) by mouth every 6 (six) hours as needed for moderate pain.   AMLODIPINE (NORVASC) 10 MG TABLET    Take 1 tablet (10 mg total) by mouth daily.   AMOXICILLIN (AMOXIL) 500 MG CAPSULE    Take 1 capsule (500 mg total) by mouth every 8 (eight) hours.   DICLOFENAC SODIUM (VOLTAREN) 1 % GEL    Apply 2 g topically 4 (four) times daily.   FEEDING SUPPLEMENT, ENSURE ENLIVE, (ENSURE ENLIVE) LIQD    Take 237 mLs by mouth 2 (two) times daily between meals.   METOPROLOL SUCCINATE (TOPROL XL) 25 MG 24 HR TABLET    Take 1 tablet (25 mg total) by mouth 2 (two) times daily.  Modified Medications   No medications on file  Discontinued Medications   No medications on file  No Known Allergies   REVIEW OF SYSTEMS:  GENERAL: no change in appetite, no fatigue, no weight changes, no fever, chills or weakness EYES: Denies change in vision, dry eyes, eye pain, itching or discharge EARS: Denies change in hearing, ringing in ears, or earache NOSE: Denies nasal congestion or epistaxis MOUTH and THROAT: Denies oral discomfort, gingival pain or bleeding, pain from teeth or hoarseness   RESPIRATORY: no cough, SOB, DOE, wheezing, hemoptysis CARDIAC: no chest pain, edema or palpitations GI: no abdominal pain, diarrhea, constipation, heart burn, nausea or vomiting GU: Denies dysuria, frequency, hematuria, incontinence, or discharge PSYCHIATRIC: Denies feeling of depression or anxiety. No report of hallucinations, insomnia, paranoia, or agitation    PHYSICAL EXAMINATION  GENERAL APPEARANCE: Well nourished. In  no acute distress. Normal body habitus SKIN:  Skin is warm and dry.  HEAD: Normal in size and contour. No evidence of trauma EYES: Lids open and close normally. No blepharitis, entropion or ectropion. PERRL. Conjunctivae are clear and sclerae are white. Lenses are without opacity EARS: Pinnae are normal. Patient hears normal voice tunes of the examiner MOUTH and THROAT: Lips are without lesions. Oral mucosa is moist and without lesions. Tongue is normal in shape, size, and color and without lesions NECK: supple, trachea midline, no neck masses, no thyroid tenderness, no thyromegaly LYMPHATICS: no LAN in the neck, no supraclavicular LAN RESPIRATORY: breathing is even & unlabored, BS CTAB CARDIAC: RRR, no murmur,no extra heart sounds, no edema GI: abdomen soft, normal BS, no masses, no tenderness, no hepatomegaly, no splenomegaly EXTREMITIES:  Able to move 4 extremities; BLE generalized weakness; kyphotic PSYCHIATRIC: Alert and oriented X 3. Affect and behavior are appropriate  LABS/RADIOLOGY: Labs reviewed: Basic Metabolic Panel:  Recent Labs  40/98/11 0804 05/21/15 0404 05/22/15 0333 05/23/15 0336  NA  --  132* 133* 130*  K  --  3.5 4.0 4.0  CL  --  97* 98* 97*  CO2  --  GLUCOSE  --  95 103* 100*  BUN  --  CREATININE 0.54 0.53 0.51 0.44  CALCIUM  --  8.6* 9.2 8.5*  MG 1.9  --   --   --   PHOS 2.5  --   --   --    Liver Function Tests:  Recent Labs  05/21/15 0404  AST 20  ALT 16  ALKPHOS 57  BILITOT 0.6  PROT 5.9*  ALBUMIN 3.1*   CBC:  Recent Labs  05/18/15 0100 05/19/15 2055 05/20/15 0804 05/21/15 0404  WBC 10.2 9.6 9.1 7.9  NEUTROABS 7.4 6.8  --  5.4  HGB 14.9 15.3* 15.1* 13.4  HCT 43.4 45.7 44.5 41.5  MCV 84.3 86.6 85.9 86.6  PLT 402* 409* 415* 389   Cardiac Enzymes:  Recent Labs  05/18/15 0100  TROPONINI <0.03    Dg Chest 2 View  05/19/2015  CLINICAL DATA:  Cough EXAM: CHEST  2 VIEW COMPARISON:  05/16/2015 FINDINGS: Cardiac  shadow is stable. The mitral annular calcifications are again seen. The lungs are clear. Pressure deformities are noted within thoracic spine stable from the previous exam. No acute bony abnormality is noted. IMPRESSION: No active cardiopulmonary disease. Electronically Signed   By: Alcide Clever M.D.   On: 05/19/2015 20:40   Dg Chest 2 View  05/16/2015  CLINICAL DATA:  Larey Seat yesterday.  Hypertension and COPD. EXAM: CHEST  2 VIEW COMPARISON:  12/10/2013 FINDINGS: Chronic left ventricular prominence. Atherosclerosis of the aorta. The lungs are  clear. No effusions. There is an old augmented fracture at T6. Partial compression fractures are seen above that an below that at T7 and T10. Old healed rib fractures on the left as seen previously. IMPRESSION: No acute finding. Left ventricular prominence. Atherosclerosis of the aorta. Old thoracic fractures and old rib fractures. Electronically Signed   By: Paulina Fusi M.D.   On: 05/16/2015 11:40   Ct Head Wo Contrast  05/18/2015  CLINICAL DATA:  Lost balance and fell, confusion. EXAM: CT HEAD WITHOUT CONTRAST TECHNIQUE: Contiguous axial images were obtained from the base of the skull through the vertex without intravenous contrast. COMPARISON:  Head CT 01/06/2011 FINDINGS: No intracranial hemorrhage, mass effect, or midline shift. Generalized atrophy is unchanged. Chronic small vessel ischemia, has progressed in the interim. No hydrocephalus. The basilar cisterns are patent. No evidence of territorial infarct. Remote lacunar infarct in the right thalamus, unchanged. No intracranial fluid collection. Calvarium is intact. Included paranasal sinuses and mastoid air cells are well aerated. IMPRESSION: 1.  No acute intracranial abnormality. 2. Progressive chronic small vessel ischemic change from exam 4.5 years prior. Atrophy is grossly stable. Electronically Signed   By: Rubye Oaks M.D.   On: 05/18/2015 04:03   Dg Hip Unilat With Pelvis 2-3 Views Left  05/16/2015   CLINICAL DATA:  Left posterior hip pain after a fall. Slipped in the bathroom. 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 Other bones of the pelvis are unremarkable. IMPRESSION: No acute or traumatic finding. 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 Electronically Signed   By: Paulina Fusi M.D.   On: 05/16/2015 11:41    ASSESSMENT/PLAN:  Generalized weakness - for rehabilitation  UTI - received 3 days of ceftriaxone and discharged with amoxicillin 500 mg 1 capsule by mouth every 8 hours 4 days to complete 7 days of treatment  Chronic hyponatremia - thought to be due to poor PO intake sodium 130; given IVFs; sodium chronically ranges 126 to 130; check BMP  Cognitive impairment - stable  COPD - no SOB  Hypertension - continue metoprolol succinate 25 mg 24 hour 1 tab by mouth twice a day and amlodipine 10 mg 1 tab by mouth daily   Pain on the left medial thigh - possibly due to one of falls; continue diclofenac 1% gel 2 g topically 4 times a day  Protein calorie malnutrition - albumin 3.1; start Procel 1 scoop by mouth twice a day  Nicotine abuse - refuses to be on Nicotine patch and verbalized that she does not crave for cigarettes      Goals of care:  Short-term rehabilitation    [facility], NP [facility] [phone]

## 2015-05-25 ENCOUNTER — Non-Acute Institutional Stay (SKILLED_NURSING_FACILITY): Payer: Medicare Other | Admitting: Internal Medicine

## 2015-05-25 ENCOUNTER — Encounter: Payer: Self-pay | Admitting: Internal Medicine

## 2015-05-25 DIAGNOSIS — E871 Hypo-osmolality and hyponatremia: Secondary | ICD-10-CM

## 2015-05-25 DIAGNOSIS — M25552 Pain in left hip: Secondary | ICD-10-CM

## 2015-05-25 DIAGNOSIS — B9620 Unspecified Escherichia coli [E. coli] as the cause of diseases classified elsewhere: Secondary | ICD-10-CM | POA: Diagnosis not present

## 2015-05-25 DIAGNOSIS — I10 Essential (primary) hypertension: Secondary | ICD-10-CM | POA: Diagnosis not present

## 2015-05-25 DIAGNOSIS — N390 Urinary tract infection, site not specified: Secondary | ICD-10-CM | POA: Diagnosis not present

## 2015-05-25 DIAGNOSIS — J438 Other emphysema: Secondary | ICD-10-CM

## 2015-05-25 DIAGNOSIS — R4189 Other symptoms and signs involving cognitive functions and awareness: Secondary | ICD-10-CM

## 2015-05-25 DIAGNOSIS — R531 Weakness: Secondary | ICD-10-CM

## 2015-05-25 DIAGNOSIS — E440 Moderate protein-calorie malnutrition: Secondary | ICD-10-CM

## 2015-05-25 LAB — BASIC METABOLIC PANEL
BUN: 12 mg/dL (ref 4–21)
Creatinine: 0.6 mg/dL (ref 0.5–1.1)
Glucose: 97 mg/dL
Potassium: 5 mmol/L (ref 3.4–5.3)
SODIUM: 129 mmol/L — AB (ref 137–147)

## 2015-05-25 NOTE — Progress Notes (Signed)
LOCATION: [facility]  PCP: Gweneth Dimitri, MD   Code Status: DNR  Goals of care: Advanced Directive information Advanced Directives 05/25/2015  Does patient have an advance directive? Yes  Type of Advance Directive Out of facility DNR (pink MOST or yellow form)  Does patient want to make changes to advanced directive? No - Patient declined  Copy of advanced directive(s) in chart? Yes       Extended Emergency Contact Information Primary Emergency Contact: Hewett,[name] Address: [address] [place], [place] Home Phone: [phone] Mobile Phone: [phone] Relation: Other Secondary Emergency Contact: Lovenia [place], [place] [address] [place] Home Phone: [phone] Relation: Friend   No Known Allergies  Chief Complaint  Patient presents with  . New Admit To SNF    New Admission     HPI:  Patient is a 75 y.o. female seen today for short term rehabilitation post hospital admission from generalized weakness. She had e.coli UTI and was noted to have orthostasis with hyponatremia. She required iv fluids and antibiotics. She has PMH of hypertension, COPD, ongoing tobacco abuse and chronic back pain. She is seen in her room today. She complaints of easy fatigue.   Review of Systems:  Constitutional: Negative for fever, chills HENT: Negative for headache, congestion, nasal discharge Eyes: Negative for blurred vision  Respiratory: Negative for cough, shortness of breath and wheezing.   Cardiovascular: Negative for chest pain, palpitations  Gastrointestinal: Negative for heartburn, nausea, vomiting, abdominal pain. Had bowel movement yesterday Genitourinary: Negative for dysuria Musculoskeletal: Negative for back pain Skin: Negative for itching, rash.  Psychiatric/Behavioral: Negative for depression   Past Medical History  Diagnosis Date  . Back pain   . Hypertension   . COPD (chronic obstructive  pulmonary disease) (HCC)   . Anxiety   . Hyperlipemia   . Compression fracture   . Hypercalcemia   . UTI (lower urinary tract infection)   . Generalized weakness   . Hyponatremia   . Cognitive impairment   . Alcohol abuse   . Nicotine abuse   . Frequent falls    Past Surgical History  Procedure Laterality Date  . Back surgery    . Dental surgery    . Hip arthroplasty Left 12/08/2013    Procedure: LEFT HIP HEMI ARTHROPLASTY ;  Surgeon: Javier Docker, MD;  Location: [facility];  Service: Orthopedics;  Laterality: Left;   Social History:   reports that she has been smoking Cigarettes.  She has a 5 pack-year smoking history. She has never used smokeless tobacco. She reports that she drinks about 1.2 oz of alcohol per week. She reports that she does not use illicit drugs.  No family history on file.  Medications:   Medication List       This list is accurate as of: 05/25/15  8:34 AM.  Always use your most recent med list.               acetaminophen 500 MG tablet  Commonly known as:  TYLENOL  Take 2 tablets (1,000 mg total) by mouth every 6 (six) hours as needed for moderate pain.     amLODipine 10 MG tablet  Commonly known as:  NORVASC  Take 1 tablet (10 mg total) by mouth daily.     amoxicillin 500 MG capsule  Commonly known as:  AMOXIL  Take 1 capsule (500  mg total) by mouth every 8 (eight) hours.     diclofenac sodium 1 % Gel  Commonly known as:  VOLTAREN  Apply 2 g topically 4 (four) times daily.     feeding supplement (ENSURE ENLIVE) Liqd  Take 237 mLs by mouth 2 (two) times daily between meals.     metoprolol succinate 25 MG 24 hr tablet  Commonly known as:  TOPROL XL  Take 1 tablet (25 mg total) by mouth 2 (two) times daily.     PROCEL PO  Take 1 scoop by mouth 2 (two) times daily.        Immunizations: Immunization History  Administered Date(s) Administered  . PPD Test 05/23/2015     Physical Exam: Filed Vitals:   05/25/15 0810  BP: 141/84    Pulse: 75  Temp: 98.8 F (37.1 C)  TempSrc: Oral  Resp: 18  Height: 5\' 7"  (1.702 m)  Weight: 108 lb (48.988 kg)  SpO2: 92%   Body mass index is 16.91 kg/(m^2).  General- elderly female, thin built and frail, in no acute distress Head- normocephalic, atraumatic Nose- no maxillary or frontal sinus tenderness, no nasal discharge Throat- moist mucus membrane Eyes- PERRLA, EOMI, no pallor, no icterus Neck- no cervical lymphadenopathy Cardiovascular- normal s1,s2, no leg edema Respiratory- bilateral clear to auscultation, no wheeze, no rhonchi, no crackles, no use of accessory muscles Abdomen- bowel sounds present, soft, non tender Musculoskeletal- able to move all 4 extremities, generalized weakness Neurological- alert and oriented to person and place but not to time Skin- warm and dry Psychiatry- normal mood and affect    Labs reviewed: Basic Metabolic Panel:  Recent Labs  95/28/4102/26/17 0804 05/21/15 0404 05/22/15 0333 05/23/15 05/23/15 0336  NA  --  132* 133* 130* 130*  K  --  3.5 4.0  --  4.0  CL  --  97* 98*  --  97*  CO2  --  26 27  --  27  GLUCOSE  --  95 103*  --  100*  BUN  --  8 11 10 10   CREATININE 0.54 0.53 0.51 0.4* 0.44  CALCIUM  --  8.6* 9.2  --  8.5*  MG 1.9  --   --   --   --   PHOS 2.5  --   --   --   --    Liver Function Tests:  Recent Labs  05/21/15 0404  AST 20  ALT 16  ALKPHOS 57  BILITOT 0.6  PROT 5.9*  ALBUMIN 3.1*   No results for input(s): LIPASE, AMYLASE in the last 8760 hours. No results for input(s): AMMONIA in the last 8760 hours. CBC:  Recent Labs  05/18/15 0100 05/19/15 2055 05/20/15 0804 05/21/15 05/21/15 0404  WBC 10.2 9.6 9.1 7.9 7.9  NEUTROABS 7.4 6.8  --   --  5.4  HGB 14.9 15.3* 15.1*  --  13.4  HCT 43.4 45.7 44.5  --  41.5  MCV 84.3 86.6 85.9  --  86.6  PLT 402* 409* 415*  --  389   Cardiac Enzymes:  Recent Labs  05/18/15 0100  TROPONINI <0.03   BNP: Invalid input(s): POCBNP CBG: No results for  input(s): GLUCAP in the last 8760 hours.  Radiological Exams: Dg Chest 2 View  05/19/2015  CLINICAL DATA:  Cough EXAM: CHEST  2 VIEW COMPARISON:  05/16/2015 FINDINGS: Cardiac shadow is stable. The mitral annular calcifications are again seen. The lungs are clear. Pressure deformities are noted within thoracic spine  stable from the previous exam. No acute bony abnormality is noted. IMPRESSION: No active cardiopulmonary disease. Electronically Signed   By: Alcide Clever M.D.   On: 05/19/2015 20:40   Dg Chest 2 View  05/16/2015  CLINICAL DATA:  Larey Seat yesterday.  Hypertension and COPD. EXAM: CHEST  2 VIEW COMPARISON:  12/10/2013 FINDINGS: Chronic left ventricular prominence. Atherosclerosis of the aorta. The lungs are clear. No effusions. There is an old augmented fracture at T6. Partial compression fractures are seen above that an below that at T7 and T10. Old healed rib fractures on the left as seen previously. IMPRESSION: No acute finding. Left ventricular prominence. Atherosclerosis of the aorta. Old thoracic fractures and old rib fractures. Electronically Signed   By: Paulina Fusi M.D.   On: 05/16/2015 11:40   Ct Head Wo Contrast  05/18/2015  CLINICAL DATA:  Lost balance and fell, confusion. EXAM: CT HEAD WITHOUT CONTRAST TECHNIQUE: Contiguous axial images were obtained from the base of the skull through the vertex without intravenous contrast. COMPARISON:  Head CT 01/06/2011 FINDINGS: No intracranial hemorrhage, mass effect, or midline shift. Generalized atrophy is unchanged. Chronic small vessel ischemia, has progressed in the interim. No hydrocephalus. The basilar cisterns are patent. No evidence of territorial infarct. Remote lacunar infarct in the right thalamus, unchanged. No intracranial fluid collection. Calvarium is intact. Included paranasal sinuses and mastoid air cells are well aerated. IMPRESSION: 1.  No acute intracranial abnormality. 2. Progressive chronic small vessel ischemic change from  exam 4.5 years prior. Atrophy is grossly stable. Electronically Signed   By: Rubye Oaks M.D.   On: 05/18/2015 04:03   Dg Hip Unilat With Pelvis 2-3 Views Left  05/16/2015  CLINICAL DATA:  Left posterior hip pain after a fall. Slipped in the bathroom. 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 Other bones of the pelvis are unremarkable. IMPRESSION: No acute or traumatic finding. 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 Electronically Signed   By: Paulina Fusi M.D.   On: 05/16/2015 11:41    Assessment/Plan  Generalized weakness From deconditioning. Will have her work with physical therapy and occupational therapy team to help with gait training and muscle strengthening exercises.fall precautions. Skin care. Encourage to be out of bed.   E.coli UTI Continue amoxicillin 500 mg tid until 05/27/15. Maintain hydration.   Chronic hyponatremia S/p iv fluid in hospital. Encourage hydration and po intake. Check bmp  Protein calorie malnutrition Encourage po intake. Get dietary consult and provide procel supplement for now  Cognitive impairment Get SLP consult   COPD Stable, monitor clinically, counselled on smoking cessation  Hypertension Stable, monitor BP. continue metoprolol succinate 25 mg bid and amlodipine 10 mg daily.  Left hip pain Chronic, hx of left hip fracture. Continue prn tylenol and voltaren gel for now   Goals of care: short term rehabilitation   Labs/tests ordered: bmp 1 week  Family/ staff Communication: reviewed care plan with patient and nursing supervisor    Oneal Grout, MD Internal Medicine [facility] [address] [place], [place] [address] Cell Phone (Monday-Friday 8 am - 5 pm): [phone] On Call: [phone] and follow prompts after 5 pm and on weekends Office Phone: [phone] Office Fax:  [phone]

## 2015-05-31 LAB — BASIC METABOLIC PANEL
BUN: 14 mg/dL (ref 4–21)
CREATININE: 0.6 mg/dL (ref 0.5–1.1)
Glucose: 79 mg/dL
Potassium: 5 mmol/L (ref 3.4–5.3)
Sodium: 134 mmol/L — AB (ref 137–147)

## 2015-06-18 ENCOUNTER — Encounter: Payer: Self-pay | Admitting: Adult Health

## 2015-06-18 ENCOUNTER — Non-Acute Institutional Stay (SKILLED_NURSING_FACILITY): Payer: Medicare Other | Admitting: Adult Health

## 2015-06-18 DIAGNOSIS — R531 Weakness: Secondary | ICD-10-CM | POA: Diagnosis not present

## 2015-06-18 DIAGNOSIS — M79605 Pain in left leg: Secondary | ICD-10-CM | POA: Diagnosis not present

## 2015-06-18 DIAGNOSIS — I10 Essential (primary) hypertension: Secondary | ICD-10-CM

## 2015-06-18 DIAGNOSIS — R4189 Other symptoms and signs involving cognitive functions and awareness: Secondary | ICD-10-CM

## 2015-06-18 DIAGNOSIS — E440 Moderate protein-calorie malnutrition: Secondary | ICD-10-CM | POA: Diagnosis not present

## 2015-06-18 DIAGNOSIS — E871 Hypo-osmolality and hyponatremia: Secondary | ICD-10-CM | POA: Diagnosis not present

## 2015-06-18 DIAGNOSIS — J438 Other emphysema: Secondary | ICD-10-CM | POA: Diagnosis not present

## 2015-06-18 NOTE — Progress Notes (Signed)
Patient ID: [name], female   DOB: 27-Jun-1940, 75 y.o.   MRN: [id number]     DATE:  06/18/2015   MRN:  [id number]  BIRTHDAY: 03-11-1941  Facility:  Nursing Home Location:  [facility]  Nursing Home Room Number: [id number]  LEVEL OF CARE:  SNF [phone])  Contact Information    Name Relation Home Work Mobile   Hewett,[name] Other [phone]  [phone]   Parke Poisson [phone]     Jasmine Awe [phone]        Code Status History    Date Active Date Inactive Code Status Order ID Comments User Context   05/20/2015  7:06 AM 05/23/2015  5:07 PM Full Code [id number]  Bobette Mo, MD Inpatient   12/08/2013  2:38 PM 12/12/2013  4:28 PM Full Code [id number]  Javier Docker, MD Inpatient   08/17/2012  6:10 PM 08/23/2012  6:36 PM Full Code [id number]  Laveda Norman, MD Inpatient    Advance Directive Documentation        Most Recent Value   Type of Advance Directive  Out of facility DNR (pink MOST or yellow form)   Pre-existing out of facility DNR order (yellow form or pink MOST form)     "MOST" Form in Place?         Chief Complaint  Patient presents with  . Discharge Note    HISTORY OF PRESENT ILLNESS:  This is a 75 year old female who is for discharge home with Home health PT for endurance, OT for ADLs and CNA for showers. DME:  Wheelchair with cushion. She has been admitted to [facility] on 05/23/15 from [facility]. She has PMH of hypertension, COPD, ongoing tobacco abuse and chronic back pain. She was having weakness and difficulty with standing and ambulating. She was found to have acute and chronic hyponatremia with sodium of 128 and a UTI. She has received 3 days of ceftriaxone and given IV fluids.   Patient was admitted to this facility for short-term rehabilitation after the patient's recent hospitalization.  Patient has completed SNF rehabilitation and therapy has cleared the patient for discharge.  PAST MEDICAL  HISTORY:  Past Medical History  Diagnosis Date  . Back pain   . Hypertension   . COPD (chronic obstructive pulmonary disease) (HCC)   . Anxiety   . Hyperlipemia   . Compression fracture   . Hypercalcemia   . UTI (lower urinary tract infection)   . Generalized weakness   . Chronic hyponatremia   . Cognitive impairment   . Alcohol abuse   . Nicotine abuse   . Frequent falls   . E. coli UTI   . 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 Malnutrition of moderate degree (HCC)   . Left hip pain      CURRENT MEDICATIONS: Reviewed  Patient's Medications  New Prescriptions   No medications on file  Previous Medications   ACETAMINOPHEN (TYLENOL) 500 MG TABLET    Take 2 tablets (1,000 mg total) by mouth every 6 (six) hours as needed for moderate pain.   AMLODIPINE (NORVASC) 10 MG TABLET    Take 1 tablet (10 mg total) by mouth daily.   DICLOFENAC SODIUM (VOLTAREN) 1 % GEL    Apply 2 g topically 4 (four) times daily.   METOPROLOL SUCCINATE (TOPROL XL) 25 MG 24 HR TABLET    Take 1 tablet (25 mg total) by mouth 2 (two) times  daily.   PROTEIN (PROCEL PO)    Take 1 scoop by mouth 2 (two) times daily.   UNABLE TO FIND    Med Name: MedPass 120 mL po BID for nutritional supplement  Modified Medications   No medications on file  Discontinued Medications   AMOXICILLIN (AMOXIL) 500 MG CAPSULE    Take 1 capsule (500 mg total) by mouth every 8 (eight) hours.   FEEDING SUPPLEMENT, ENSURE ENLIVE, (ENSURE ENLIVE) LIQD    Take 237 mLs by mouth 2 (two) times daily between meals.     No Known Allergies   REVIEW OF SYSTEMS:  GENERAL: no change in appetite, no fatigue, no weight changes, no fever, chills or weakness EYES: Denies change in vision, dry eyes, eye pain, itching or discharge EARS: Denies change in hearing, ringing in ears, or earache NOSE: Denies nasal congestion or epistaxis MOUTH and THROAT: Denies oral discomfort, gingival pain or  bleeding, pain from teeth or hoarseness   RESPIRATORY: no cough, SOB, DOE, wheezing, hemoptysis CARDIAC: no chest pain, edema or palpitations GI: no abdominal pain, diarrhea, constipation, heart burn, nausea or vomiting GU: Denies dysuria, frequency, hematuria, incontinence, or discharge PSYCHIATRIC: Denies feeling of depression or anxiety. No report of hallucinations, insomnia, paranoia, or agitation    PHYSICAL EXAMINATION  GENERAL APPEARANCE: Well nourished. In no acute distress. Normal body habitus SKIN:  Skin is warm and dry.  HEAD: Normal in size and contour. No evidence of trauma EYES: Lids open and close normally. No blepharitis, entropion or ectropion. PERRL. Conjunctivae are clear and sclerae are white. Lenses are without opacity EARS: Pinnae are normal. Patient hears normal voice tunes of the examiner MOUTH and THROAT: Lips are without lesions. Oral mucosa is moist and without lesions. Tongue is normal in shape, size, and color and without lesions NECK: supple, trachea midline, no neck masses, no thyroid tenderness, no thyromegaly LYMPHATICS: no LAN in the neck, no supraclavicular LAN RESPIRATORY: breathing is even & unlabored, BS CTAB CARDIAC: RRR, no murmur,no extra heart sounds, no edema GI: abdomen soft, normal BS, no masses, no tenderness, no hepatomegaly, no splenomegaly EXTREMITIES:  Able to move 4 extremities; BLE generalized weakness; kyphotic PSYCHIATRIC: Alert and oriented X 3. Affect and behavior are appropriate  LABS/RADIOLOGY: Labs reviewed: Basic Metabolic Panel:  Recent Labs  16/12/9600/26/17 0804 05/21/15 0404 05/22/15 0333  05/23/15 0336 05/25/15 05/31/15  NA  --  132* 133*  < > 130* 129* 134*  K  --  3.5 4.0  --  4.0 5.0 5.0  CL  --  97* 98*  --  97*  --   --   CO2  --  26 27  --  27  --   --   GLUCOSE  --  95 103*  --  100*  --   --   BUN  --  8 11  < > 10 12 14   CREATININE 0.54 0.53 0.51  < > 0.44 0.6 0.6  CALCIUM  --  8.6* 9.2  --  8.5*  --   --    MG 1.9  --   --   --   --   --   --   PHOS 2.5  --   --   --   --   --   --   < > = values in this interval not displayed. Liver Function Tests:  Recent Labs  05/21/15 0404  AST 20  ALT 16  ALKPHOS 57  BILITOT 0.6  PROT 5.9*  ALBUMIN 3.1*   CBC:  Recent Labs  05/18/15 0100 05/19/15 2055 05/20/15 0804 05/21/15 05/21/15 0404  WBC 10.2 9.6 9.1 7.9 7.9  NEUTROABS 7.4 6.8  --   --  5.4  HGB 14.9 15.3* 15.1*  --  13.4  HCT 43.4 45.7 44.5  --  41.5  MCV 84.3 86.6 85.9  --  86.6  PLT 402* 409* 415*  --  389   Cardiac Enzymes:  Recent Labs  05/18/15 0100  TROPONINI <0.03    Dg Chest 2 View  05/19/2015  CLINICAL DATA:  Cough EXAM: CHEST  2 VIEW COMPARISON:  05/16/2015 FINDINGS: Cardiac shadow is stable. The mitral annular calcifications are again seen. The lungs are clear. Pressure deformities are noted within thoracic spine stable from the previous exam. No acute bony abnormality is noted. IMPRESSION: No active cardiopulmonary disease. Electronically Signed   By: Alcide Clever M.D.   On: 05/19/2015 20:40    ASSESSMENT/PLAN:  Generalized weakness - for Home health PT, OT and CNA   UTI -  resolved  Chronic hyponatremia - thought to be due to poor PO intake sodium; re-check NA 134 , stable  Cognitive impairment - stable  COPD - no SOB  Hypertension - continue metoprolol succinate 25 mg 24 hour 1 tab by mouth twice a day and amlodipine 10 mg 1 tab by mouth daily   Pain on the left medial thigh - possibly due to one of falls; continue diclofenac 1% gel 2 g topically 2 gm 4 times a day  Protein calorie malnutrition - albumin 3.1; continue Procel 1 scoop by mouth twice a day      I have filled out patient's discharge paperwork and written prescriptions.  Patient will receive home health PT, OT, ST, nursing and CNA.  DME provided:  Wheelchair with cushion  Total discharge time: Greater than 30 minutes  Discharge time involved coordination of the discharge process  with Child psychotherapist, nursing staff and therapy department. Medical justification for home health services/DME verified.     [facility], NP [employer] [phone]

## 2016-04-04 ENCOUNTER — Emergency Department (HOSPITAL_COMMUNITY): Payer: Medicare Other

## 2016-04-04 ENCOUNTER — Encounter (HOSPITAL_COMMUNITY): Payer: Self-pay | Admitting: Emergency Medicine

## 2016-04-04 ENCOUNTER — Emergency Department (HOSPITAL_COMMUNITY)
Admission: EM | Admit: 2016-04-04 | Discharge: 2016-04-05 | Disposition: A | Discharge status: Home / Self Care | Payer: Medicare Other | Attending: Emergency Medicine | Admitting: Emergency Medicine

## 2016-04-04 DIAGNOSIS — I10 Essential (primary) hypertension: Secondary | ICD-10-CM | POA: Insufficient documentation

## 2016-04-04 DIAGNOSIS — S3992XA Unspecified injury of lower back, initial encounter: Secondary | ICD-10-CM | POA: Insufficient documentation

## 2016-04-04 DIAGNOSIS — Y999 Unspecified external cause status: Secondary | ICD-10-CM | POA: Insufficient documentation

## 2016-04-04 DIAGNOSIS — S32048A Other fracture of fourth lumbar vertebra, initial encounter for closed fracture: Secondary | ICD-10-CM | POA: Diagnosis not present

## 2016-04-04 DIAGNOSIS — R930 Abnormal findings on diagnostic imaging of skull and head, not elsewhere classified: Secondary | ICD-10-CM | POA: Diagnosis not present

## 2016-04-04 DIAGNOSIS — Y929 Unspecified place or not applicable: Secondary | ICD-10-CM | POA: Insufficient documentation

## 2016-04-04 DIAGNOSIS — F17210 Nicotine dependence, cigarettes, uncomplicated: Secondary | ICD-10-CM | POA: Diagnosis not present

## 2016-04-04 DIAGNOSIS — J449 Chronic obstructive pulmonary disease, unspecified: Secondary | ICD-10-CM | POA: Diagnosis not present

## 2016-04-04 DIAGNOSIS — R52 Pain, unspecified: Secondary | ICD-10-CM

## 2016-04-04 DIAGNOSIS — W06XXXA Fall from bed, initial encounter: Secondary | ICD-10-CM | POA: Diagnosis not present

## 2016-04-04 DIAGNOSIS — Z96642 Presence of left artificial hip joint: Secondary | ICD-10-CM | POA: Diagnosis not present

## 2016-04-04 DIAGNOSIS — Y939 Activity, unspecified: Secondary | ICD-10-CM | POA: Insufficient documentation

## 2016-04-04 DIAGNOSIS — S32000A Wedge compression fracture of unspecified lumbar vertebra, initial encounter for closed fracture: Secondary | ICD-10-CM

## 2016-04-04 DIAGNOSIS — S32038A Other fracture of third lumbar vertebra, initial encounter for closed fracture: Secondary | ICD-10-CM | POA: Diagnosis not present

## 2016-04-04 NOTE — ED Triage Notes (Signed)
Brought in by EMS from [facility] with c/o lower back pain after her fall tonight.  Pt reported that she was getting out of bed when she lost her balance and fell.  Pt reports lower back pain after her fall.  Pt denies headache or dizziness--- pt admitted to hitting head during her fall.

## 2016-04-04 NOTE — ED Notes (Signed)
Bed: [id number] Expected date:  Expected time:  Means of arrival:  Comments: 75YO F fall

## 2016-04-05 NOTE — ED Notes (Signed)
[facility] was called and notified of pt's need of transportation back to [facility].

## 2016-04-05 NOTE — ED Provider Notes (Signed)
[facility]-EMERGENCY DEPT Provider Note   CSN: [id number] Arrival date & time: 04/04/16  2030     History   Chief Complaint Chief Complaint  Patient presents with  . Fall  . Back Pain    HPI [name] is a 76 y.o. female.  HPI Pt comes in with cc of fall. [facility] and reports that she had a mechanical fall. Pt is having back pain. She has no other complains. Pt denies nausea, emesis, fevers, chills, chest pains, shortness of breath, headaches, abdominal pain, uti like symptoms.   Past Medical History:  Diagnosis Date  . Alcohol abuse   . Anxiety   . Back pain   . Chronic hyponatremia   . Cognitive impairment   . Compression fracture   . COPD (chronic obstructive pulmonary disease) (HCC)   . E. coli UTI   . Frequent falls   . Generalized weakness   . Hypercalcemia   . Hyperlipemia   . Hypertension   . Left hip pain   . Malnutrition of moderate degree (HCC)   . Nicotine abuse   . 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 UTI (lower urinary tract infection)     Patient Active Problem List   Diagnosis Date Noted  . Orthostatic hypotension 05/23/2015  . Weakness 05/20/2015  . Failure to thrive in adult 05/20/2015  . HCAP (healthcare-associated pneumonia) 12/12/2013  . Acute respiratory failure with hypoxia (HCC) 12/10/2013  . Closed left hip fracture (HCC) 12/07/2013  . Malnutrition of moderate degree (HCC) 12/07/2013  . Fracture of hip, left, open (HCC) 12/06/2013  . Hilar mass 12/06/2013  . Alcohol abuse 12/06/2013  . Nicotine abuse 12/06/2013  . Cardiomegaly 12/06/2013  . Leukocytosis 12/06/2013  . Impacted cerumen 09/17/2012  . Hypoxemic respiratory failure, chronic (HCC) 08/17/2012  . Chronic hyponatremia 08/17/2012  . UTI (lower urinary tract infection) 08/17/2012  . Back pain 08/17/2012  . COPD (chronic obstructive pulmonary disease) (HCC) 08/17/2012  . HTN (hypertension) 08/17/2012     Past Surgical History:  Procedure Laterality Date  . BACK SURGERY    . DENTAL SURGERY    . HIP ARTHROPLASTY Left 12/08/2013   Procedure: LEFT HIP HEMI ARTHROPLASTY ;  Surgeon: Javier DockerJeffrey C Beane, MD;  Location: [facility];  Service: Orthopedics;  Laterality: Left;    OB History    No data available       Home Medications    Prior to Admission medications   Medication Sig Start Date End Date Taking? Authorizing Provider  acetaminophen (TYLENOL) 500 MG tablet Take 2 tablets (1,000 mg total) by mouth every 6 (six) hours as needed for moderate pain. 05/16/15  Yes Lyndal Pulleyaniel Knott, MD  feeding supplement, ENSURE ENLIVE, (ENSURE ENLIVE) LIQD Take 237 mLs by mouth 2 (two) times daily between meals.   Yes Historical Provider, MD  metoprolol succinate (TOPROL XL) 25 MG 24 hr tablet Take 1 tablet (25 mg total) by mouth 2 (two) times daily. 05/16/15  Yes Geoffery Lyonsouglas Delo, MD  mineral oil liquid Place 0.1 mLs in ear(s) once a week. Place 2 drops into each ear once a week on Wednesdays   Yes Historical Provider, MD  Protein (PROCEL PO) Take 1 scoop by mouth 2 (two) times daily.   Yes Historical Provider, MD  amLODipine (NORVASC) 10 MG tablet Take 1 tablet (10 mg total) by mouth daily. Patient not taking: Reported on 04/04/2016 05/23/15   Catarina Hartshornavid Tat, MD  diclofenac sodium (VOLTAREN)  1 % GEL Apply 2 g topically 4 (four) times daily. Patient not taking: Reported on 04/04/2016 05/23/15   Catarina Hartshorn, MD    Family History History reviewed. No pertinent family history.  Social History Social History  Substance Use Topics  . Smoking status: Current Some Day Smoker    Packs/day: 0.25    Years: 20.00    Types: Cigarettes  . Smokeless tobacco: Never Used  . Alcohol use 1.2 oz/week    2 Glasses of wine per week     Comment: wine socially     Allergies   Patient has no known allergies.   Review of Systems Review of Systems  ROS 10 Systems reviewed and are negative for acute change except as noted in the  HPI.    Physical Exam Updated Vital Signs BP 143/92 (BP Location: Right Arm)   Pulse 102   Temp 98.6 F (37 C) (Oral)   Resp 24   SpO2 95%   Physical Exam  Constitutional: She is oriented to person, place, and time. She appears well-developed and well-nourished.  HENT:  Head: Normocephalic and atraumatic.  Eyes: EOM are normal. Pupils are equal, round, and reactive to light.  Neck: Neck supple.  Cardiovascular: Normal rate, regular rhythm and normal heart sounds.   No murmur heard. Pulmonary/Chest: Effort normal. No respiratory distress.  Abdominal: Soft. She exhibits no distension. There is no tenderness. There is no rebound and no guarding.  Musculoskeletal:  Head to toe evaluation shows no hematoma, bleeding of the scalp, no facial abrasions, no spine step offs, crepitus of the chest or neck, no tenderness to palpation of the bilateral upper and lower extremities, no gross deformities, no chest tenderness, no pelvic pain.   Neurological: She is alert and oriented to person, place, and time.  Skin: Skin is warm and dry.  Nursing note and vitals reviewed.    ED Treatments / Results  Labs (all labs ordered are listed, but only abnormal results are displayed) Labs Reviewed  URINALYSIS, ROUTINE W REFLEX MICROSCOPIC    EKG  EKG Interpretation None       Radiology Dg Lumbar Spine 2-3 Views  Result Date: 04/04/2016 CLINICAL DATA:  Low back pain after a fall EXAM: LUMBAR SPINE - 2-3 VIEW COMPARISON:  08/20/2010 FINDINGS: Five non rib-bearing lumbar type vertebra. The SI joints appear symmetric. Mild superior endplate compression deformity at L4, new compared to 2012. Minimal superior endplate compression at L3, also new from 2012. Superior endplate deformity at T12, new compared to 2012. Moderate severe anterior compression of T10 slightly progressed since 2012. Grade 1 anterolisthesis of L4 on L5 as before. Atherosclerosis of the aorta. IMPRESSION: 1. Superior endplate  compression deformities at T12, L3 and L4, new compared to 2012 2. Moderate severe anterior compression deformity of T10, slightly progressed since 2012 Electronically Signed   By: Jasmine Pang M.D.   On: 04/04/2016 23:42   Dg Pelvis 1-2 Views  Result Date: 04/04/2016 CLINICAL DATA:  Fall with hip pain EXAM: PELVIS - 1-2 VIEW COMPARISON:  12/08/2013 FINDINGS: Moderate severe arthritis right hip. Patient is status post left hip replacement with no dislocation. There is no acute fracture. The pubic symphysis appears intact. IMPRESSION: Status post left hip replacement without dislocation. No gross fracture. Electronically Signed   By: Jasmine Pang M.D.   On: 04/04/2016 23:43   Ct Head Wo Contrast  Result Date: 04/04/2016 CLINICAL DATA:  Status post fall. Hit head, with concern for head or cervical spine injury.  Initial encounter. EXAM: CT HEAD WITHOUT CONTRAST CT CERVICAL SPINE WITHOUT CONTRAST TECHNIQUE: Multidetector CT imaging of the head and cervical spine was performed following the standard protocol without intravenous contrast. Multiplanar CT image reconstructions of the cervical spine were also generated. COMPARISON:  CT of the head performed 05/18/2015, and MRI of the brain performed 01/07/2011 FINDINGS: CT HEAD FINDINGS Brain: No evidence of acute infarction, hemorrhage, hydrocephalus, extra-axial collection or mass lesion/mass effect. Prominence of ventricles and sulci reflects moderate cortical volume loss. Mild cerebellar atrophy is noted. Diffuse periventricular subcortical white matter change likely reflects small vessel ischemic microangiopathy. Chronic lacunar infarcts are seen at the basal ganglia and right thalamus, and at the right side of the pons. The fourth ventricle is within normal limits. The cerebral hemispheres demonstrate grossly normal gray-white differentiation. No mass effect or midline shift is seen. Vascular: No hyperdense vessel or unexpected calcification. Skull: There is  no evidence of fracture; visualized osseous structures are unremarkable in appearance. Sinuses/Orbits: The orbits are within normal limits. The paranasal sinuses and mastoid air cells are well-aerated. Other: No significant soft tissue abnormalities are seen. CT CERVICAL SPINE FINDINGS Alignment: Normal. Skull base and vertebrae: No acute fracture. There is mild chronic loss of height involving all vertebral bodies from C4-T6, with changes of vertebroplasty at T6. No primary bone lesion or focal pathologic process. Soft tissues and spinal canal: No prevertebral fluid or swelling. No visible canal hematoma. Disc levels: Intervertebral disc spaces are grossly preserved. Mild facet disease is noted at the mid cervical spine. Upper chest: The visualized lung apices are grossly clear. The thyroid gland is unremarkable in appearance. Scattered calcification is seen at the carotid bifurcations bilaterally. Other: No additional soft tissue abnormalities are seen. IMPRESSION: 1. No evidence of traumatic intracranial injury or fracture. 2. No evidence of fracture or subluxation along the cervical spine. 3. Moderate cortical volume loss and diffuse small vessel ischemic microangiopathy. 4. Chronic lacunar infarcts of the basal ganglia and right thalamus, and at the right side of the pons. 5. Mild chronic loss height involving all vertebral bodies from C4-T6, with changes of vertebroplasty at T6. 6. Scattered calcification at the carotid bifurcations bilaterally. Carotid ultrasound would be helpful for further evaluation, when and as deemed clinically appropriate. Electronically Signed   By: Roanna Raider M.D.   On: 04/04/2016 23:46   Ct Cervical Spine Wo Contrast  Result Date: 04/04/2016 CLINICAL DATA:  Status post fall. Hit head, with concern for head or cervical spine injury. Initial encounter. EXAM: CT HEAD WITHOUT CONTRAST CT CERVICAL SPINE WITHOUT CONTRAST TECHNIQUE: Multidetector CT imaging of the head and cervical  spine was performed following the standard protocol without intravenous contrast. Multiplanar CT image reconstructions of the cervical spine were also generated. COMPARISON:  CT of the head performed 05/18/2015, and MRI of the brain performed 01/07/2011 FINDINGS: CT HEAD FINDINGS Brain: No evidence of acute infarction, hemorrhage, hydrocephalus, extra-axial collection or mass lesion/mass effect. Prominence of ventricles and sulci reflects moderate cortical volume loss. Mild cerebellar atrophy is noted. Diffuse periventricular subcortical white matter change likely reflects small vessel ischemic microangiopathy. Chronic lacunar infarcts are seen at the basal ganglia and right thalamus, and at the right side of the pons. The fourth ventricle is within normal limits. The cerebral hemispheres demonstrate grossly normal gray-white differentiation. No mass effect or midline shift is seen. Vascular: No hyperdense vessel or unexpected calcification. Skull: There is no evidence of fracture; visualized osseous structures are unremarkable in appearance. Sinuses/Orbits: The orbits are within normal  limits. The paranasal sinuses and mastoid air cells are well-aerated. Other: No significant soft tissue abnormalities are seen. CT CERVICAL SPINE FINDINGS Alignment: Normal. Skull base and vertebrae: No acute fracture. There is mild chronic loss of height involving all vertebral bodies from C4-T6, with changes of vertebroplasty at T6. No primary bone lesion or focal pathologic process. Soft tissues and spinal canal: No prevertebral fluid or swelling. No visible canal hematoma. Disc levels: Intervertebral disc spaces are grossly preserved. Mild facet disease is noted at the mid cervical spine. Upper chest: The visualized lung apices are grossly clear. The thyroid gland is unremarkable in appearance. Scattered calcification is seen at the carotid bifurcations bilaterally. Other: No additional soft tissue abnormalities are seen.  IMPRESSION: 1. No evidence of traumatic intracranial injury or fracture. 2. No evidence of fracture or subluxation along the cervical spine. 3. Moderate cortical volume loss and diffuse small vessel ischemic microangiopathy. 4. Chronic lacunar infarcts of the basal ganglia and right thalamus, and at the right side of the pons. 5. Mild chronic loss height involving all vertebral bodies from C4-T6, with changes of vertebroplasty at T6. 6. Scattered calcification at the carotid bifurcations bilaterally. Carotid ultrasound would be helpful for further evaluation, when and as deemed clinically appropriate. Electronically Signed   By: Roanna Raider M.D.   On: 04/04/2016 23:46    Procedures Procedures (including critical care time)  Medications Ordered in ED Medications - No data to display   Initial Impression / Assessment and Plan / ED Course  I have reviewed the triage vital signs and the nursing notes.  Pertinent labs & imaging results that were available during my care of the patient were reviewed by me and considered in my medical decision making (see chart for details).  Clinical Course     DDx includes: - Mechanical falls - ICH - Fractures - Contusions - Soft tissue injury  Pt comes in with cc of fall. We will order xrays and CT head and cspine.    Final Clinical Impressions(s) / ED Diagnoses   Final diagnoses:  Pain  Fall, initial encounter    New Prescriptions New Prescriptions   No medications on file     Derwood Kaplan, MD 04/05/16 [id number]

## 2016-04-05 NOTE — ED Notes (Signed)
Staff at [facility] was notified of pt's discharge---- report on pt's condition and discharge instructions provided.

## 2016-04-05 NOTE — Discharge Instructions (Signed)
We saw toy in the ER after the fall. YOU HAVE COMPRESSION FRACTURE. Take tylenol for pain.   IMPRESSION: 1. Superior endplate compression deformities at T12, L3 and L4, new compared to 2012 2. Moderate severe anterior compression deformity of T10, slightly progressed since 2012  Be careful with walking.

## 2016-04-05 NOTE — ED Notes (Signed)
[facility] Expected date:  Expected time:  Means of arrival:  Comments: rm 21

## 2016-04-05 NOTE — ED Notes (Signed)
[facility] here to transport  [facility].

## 2016-05-20 ENCOUNTER — Encounter (HOSPITAL_BASED_OUTPATIENT_CLINIC_OR_DEPARTMENT_OTHER): Payer: PRIVATE HEALTH INSURANCE | Attending: Surgery

## 2016-08-06 ENCOUNTER — Encounter (HOSPITAL_COMMUNITY): Payer: Self-pay | Admitting: Obstetrics and Gynecology

## 2016-08-06 ENCOUNTER — Emergency Department (HOSPITAL_COMMUNITY)
Admission: EM | Admit: 2016-08-06 | Discharge: 2016-08-06 | Disposition: A | Discharge status: Home / Self Care | Attending: Emergency Medicine | Admitting: Emergency Medicine

## 2016-08-06 DIAGNOSIS — F17210 Nicotine dependence, cigarettes, uncomplicated: Secondary | ICD-10-CM | POA: Insufficient documentation

## 2016-08-06 DIAGNOSIS — S0181XA Laceration without foreign body of other part of head, initial encounter: Secondary | ICD-10-CM | POA: Insufficient documentation

## 2016-08-06 DIAGNOSIS — Y999 Unspecified external cause status: Secondary | ICD-10-CM | POA: Insufficient documentation

## 2016-08-06 DIAGNOSIS — I10 Essential (primary) hypertension: Secondary | ICD-10-CM | POA: Diagnosis not present

## 2016-08-06 DIAGNOSIS — J449 Chronic obstructive pulmonary disease, unspecified: Secondary | ICD-10-CM | POA: Insufficient documentation

## 2016-08-06 DIAGNOSIS — Y939 Activity, unspecified: Secondary | ICD-10-CM | POA: Diagnosis not present

## 2016-08-06 DIAGNOSIS — Y929 Unspecified place or not applicable: Secondary | ICD-10-CM | POA: Insufficient documentation

## 2016-08-06 DIAGNOSIS — Z96652 Presence of left artificial knee joint: Secondary | ICD-10-CM | POA: Insufficient documentation

## 2016-08-06 NOTE — ED Notes (Signed)
ED Provider at bedside. 

## 2016-08-06 NOTE — ED Notes (Signed)
[facility] called for Transport 

## 2016-08-06 NOTE — ED Notes (Signed)
Hospice nurse made aware patient is being seen in ED.

## 2016-08-06 NOTE — ED Provider Notes (Signed)
[facility]-EMERGENCY DEPT Provider Note   CSN: [id number] Arrival date & time: 08/06/16  1154     History   Chief Complaint Chief Complaint  Patient presents with  . Laceration    HPI [name] is a 76 y.o. female.  The history is provided by the EMS personnel and a relative. No language interpreter was used.  Laceration      [name] is a 76 y.o. female who presents to the Emergency Department complaining of fall.  Level V Due To Dementia. History Is Provided by EMS and Family. The patient was sitting in a wheelchair and fell out of the wheelchair and struck her head. At baseline she is nonambulatory and minimally verbal with contractures of her extremities. She Is currently on hospice for advanced alzheimer's.  Past Medical History:  Diagnosis Date  . Alcohol abuse   . Anxiety   . Back pain   . Chronic hyponatremia   . Cognitive impairment   . Compression fracture   . COPD (chronic obstructive pulmonary disease) (HCC)   . E. coli UTI   . Frequent falls   . Generalized weakness   . Hypercalcemia   . Hyperlipemia   . Hypertension   . Left hip pain   . Malnutrition of moderate degree (HCC)   . Nicotine abuse   . 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 UTI (lower urinary tract infection)     Patient Active Problem List   Diagnosis Date Noted  . Orthostatic hypotension 05/23/2015  . Weakness 05/20/2015  . Failure to thrive in adult 05/20/2015  . HCAP (healthcare-associated pneumonia) 12/12/2013  . Acute respiratory failure with hypoxia (HCC) 12/10/2013  . Closed left hip fracture (HCC) 12/07/2013  . Malnutrition of moderate degree (HCC) 12/07/2013  . Fracture of hip, left, open (HCC) 12/06/2013  . Hilar mass 12/06/2013  . Alcohol abuse 12/06/2013  . Nicotine abuse 12/06/2013  . Cardiomegaly 12/06/2013  . Leukocytosis 12/06/2013  . Impacted cerumen 09/17/2012  . Hypoxemic respiratory failure, chronic  (HCC) 08/17/2012  . Chronic hyponatremia 08/17/2012  . UTI (lower urinary tract infection) 08/17/2012  . Back pain 08/17/2012  . COPD (chronic obstructive pulmonary disease) (HCC) 08/17/2012  . HTN (hypertension) 08/17/2012    Past Surgical History:  Procedure Laterality Date  . BACK SURGERY    . DENTAL SURGERY    . HIP ARTHROPLASTY Left 12/08/2013   Procedure: LEFT HIP HEMI ARTHROPLASTY ;  Surgeon: Javier DockerJeffrey C Beane, MD;  Location: [facility];  Service: Orthopedics;  Laterality: Left;    OB History    No data available       Home Medications    Prior to Admission medications   Medication Sig Start Date End Date Taking? Authorizing Provider  acetaminophen (TYLENOL) 500 MG tablet Take 2 tablets (1,000 mg total) by mouth every 6 (six) hours as needed for moderate pain. 05/16/15  Yes Lyndal PulleyKnott, Daniel, MD  feeding supplement, ENSURE ENLIVE, (ENSURE ENLIVE) LIQD Take 237 mLs by mouth 2 (two) times daily between meals.   Yes [provider]  metoprolol succinate (TOPROL XL) 25 MG 24 hr tablet Take 1 tablet (25 mg total) by mouth 2 (two) times daily. 05/16/15  Yes Delo, Riley Lamouglas, MD  mineral oil liquid Place 0.1 mLs in ear(s) once a week. Place 2 drops into each ear once a week on Wednesdays   Yes [provider]  Morphine Sulfate (MORPHINE CONCENTRATE) 10 mg / 0.5 ml concentrated  solution Take 10 mg by mouth 2 (two) times daily.   Yes [provider]  Protein (PROCEL PO) Take 1 scoop by mouth 2 (two) times daily.   Yes [provider]  amLODipine (NORVASC) 10 MG tablet Take 1 tablet (10 mg total) by mouth daily. Patient not taking: Reported on 04/04/2016 05/23/15   Catarina Hartshorn, MD  diclofenac sodium (VOLTAREN) 1 % GEL Apply 2 g topically 4 (four) times daily. Patient not taking: Reported on 04/04/2016 05/23/15   Catarina Hartshorn, MD    Family History History reviewed. No pertinent family history.  Social History Social History  Substance Use Topics  . Smoking status:  Current Some Day Smoker    Packs/day: 0.25    Years: 20.00    Types: Cigarettes  . Smokeless tobacco: Never Used  . Alcohol use 1.2 oz/week    2 Glasses of wine per week     Comment: wine socially     Allergies   Patient has no known allergies.   Review of Systems Review of Systems  Unable to perform ROS: Dementia     Physical Exam Updated Vital Signs BP 125/72   Pulse (!) 101   Temp 99.2 F (37.3 C) (Axillary)   Resp 18   SpO2 95%   Physical Exam  Constitutional: She appears well-developed.  Chronically ill-appearing  HENT:  Head: Normocephalic.  Swelling and tenderness to the anterior forehead with approximately 1 cm laceration that is hemostatic to the central forehead.  Cardiovascular: Regular rhythm.   No murmur heard. Tachycardic  Pulmonary/Chest: Effort normal and breath sounds normal. No respiratory distress.  Abdominal: Soft. There is no tenderness. There is no rebound and no guarding.  Musculoskeletal:  Contractures of bilateral lower extremities. There is erythema and swelling to the left foot and ankle. There are dressings to bilateral ankles.  Neurological: She is alert.  Disoriented to place and time. Noncompliant with strength testing.  Skin: Skin is warm and dry.  Psychiatric:  Unable to assess  Nursing note and vitals reviewed.    ED Treatments / Results  Labs (all labs ordered are listed, but only abnormal results are displayed) Labs Reviewed - No data to display  EKG  EKG Interpretation None       Radiology No results found.  Procedures .Marland KitchenLaceration Repair Date/Time: 08/06/2016 1:15 PM Performed by: Tilden Fossa Authorized by: Tilden Fossa   Consent:    Consent obtained:  Verbal   Consent given by:  Healthcare agent Anesthesia (see MAR for exact dosages):    Anesthesia method:  None Laceration details:    Location:  Face   Face location:  Forehead   Wound length (cm): 1. Repair type:    Repair type:   Simple Pre-procedure details:    Patient was prepped and draped in usual sterile fashion: Skin was cleansed with chlorhexidine. Exploration:    Hemostasis achieved with:  Direct pressure   Contaminated: no   Skin repair:    Repair method:  Tissue adhesive   (including critical care time)  Medications Ordered in ED Medications - No data to display   Initial Impression / Assessment and Plan / ED Course  I have reviewed the triage vital signs and the nursing notes.  Pertinent labs & imaging results that were available during my care of the patient were reviewed by me and considered in my medical decision making (see chart for details).     Patient here for evaluation of injuries following a fall, she is  currently on hospice care for advanced Alzheimer's and has had a significant decline over the last several weeks. Discussed with [facility] hospice as well as patient's healthcare of her of attorney and decision was made to defer imaging of the head and C-spine. The wound was repaired per note. Patient denies any complaints in the emergency department. Plan to DC to facility for ongoing treatment.  Final Clinical Impressions(s) / ED Diagnoses   Final diagnoses:  Facial laceration, initial encounter    New Prescriptions New Prescriptions   No medications on file     Tilden Fossa, MD 08/06/16 1623

## 2016-08-06 NOTE — ED Notes (Signed)
Bed: [id number] Expected date:  Expected time:  Means of arrival:  Comments: EMS fall/head lac. (hospice pt.)

## 2016-08-06 NOTE — ED Notes (Addendum)
Report given to Lysle Dingwallindy, [facility] at [facility].

## 2016-08-06 NOTE — ED Notes (Signed)
Hospice Nurse Amy made aware of discharge plan.

## 2016-08-06 NOTE — ED Triage Notes (Signed)
Per EMS:  [facility] Facility reported they had her in the wheelchair next to the bed and went to get help to transport her and came back within 5 minutes to see she had fallen. Pt has a laceration on her forehead from the fall. Laceration is currently bleeding. Pt is contracted and facility reports that is her everyday positioning and she is very difficult to move.  Pt has 2 wounds Per EMS. Wounds on both ankles baseline.  Pt denies ant Head, back, or neck pain at this time.

## 2016-08-22 DEATH — deceased
